# Patient Record
Sex: Female | Born: 1935 | Race: White | Hispanic: No | Smoking: Former smoker
Health system: Southern US, Community
[De-identification: ages and names within clinical notes are randomized; demographics above are authoritative.]

## PROBLEM LIST (undated history)

## (undated) DIAGNOSIS — A0472 Enterocolitis due to Clostridium difficile, not specified as recurrent: Secondary | ICD-10-CM

## (undated) DIAGNOSIS — R911 Solitary pulmonary nodule: Secondary | ICD-10-CM

## (undated) DIAGNOSIS — M47817 Spondylosis without myelopathy or radiculopathy, lumbosacral region: Secondary | ICD-10-CM

## (undated) DIAGNOSIS — N281 Cyst of kidney, acquired: Secondary | ICD-10-CM

## (undated) DIAGNOSIS — R0902 Hypoxemia: Secondary | ICD-10-CM

## (undated) DIAGNOSIS — I495 Sick sinus syndrome: Secondary | ICD-10-CM

## (undated) DIAGNOSIS — J449 Chronic obstructive pulmonary disease, unspecified: Secondary | ICD-10-CM

## (undated) DIAGNOSIS — K5790 Diverticulosis of intestine, part unspecified, without perforation or abscess without bleeding: Secondary | ICD-10-CM

## (undated) DIAGNOSIS — I4891 Unspecified atrial fibrillation: Secondary | ICD-10-CM

## (undated) DIAGNOSIS — J14 Pneumonia due to Hemophilus influenzae: Secondary | ICD-10-CM

## (undated) HISTORY — DX: Enterocolitis due to Clostridium difficile, not specified as recurrent: A04.72

## (undated) HISTORY — DX: Cyst of kidney, acquired: N28.1

## (undated) HISTORY — DX: Sick sinus syndrome: I49.5

## (undated) HISTORY — PX: INSERT / REPLACE / REMOVE PACEMAKER: SUR710

## (undated) HISTORY — DX: Pneumonia due to hemophilus influenzae: J14

## (undated) HISTORY — PX: ABDOMINAL HYSTERECTOMY: SHX81

## (undated) HISTORY — DX: Spondylosis without myelopathy or radiculopathy, lumbosacral region: M47.817

## (undated) HISTORY — DX: Solitary pulmonary nodule: R91.1

## (undated) HISTORY — DX: Diverticulosis of intestine, part unspecified, without perforation or abscess without bleeding: K57.90

## (undated) HISTORY — DX: Chronic obstructive pulmonary disease, unspecified: J44.9

## (undated) HISTORY — DX: Hypoxemia: R09.02

## (undated) HISTORY — DX: Unspecified atrial fibrillation: I48.91

## (undated) HISTORY — PX: TOTAL HIP ARTHROPLASTY: SHX124

---

## 2002-06-16 ENCOUNTER — Inpatient Hospital Stay (HOSPITAL_COMMUNITY): Admission: EM | Admit: 2002-06-16 | Discharge: 2002-06-21 | Payer: Self-pay | Admitting: Cardiology

## 2002-06-17 ENCOUNTER — Encounter: Payer: Self-pay | Admitting: Internal Medicine

## 2002-06-18 ENCOUNTER — Encounter: Payer: Self-pay | Admitting: Cardiology

## 2002-06-21 ENCOUNTER — Encounter: Payer: Self-pay | Admitting: Cardiology

## 2004-09-10 ENCOUNTER — Ambulatory Visit: Payer: Self-pay | Admitting: Cardiology

## 2005-01-31 ENCOUNTER — Ambulatory Visit: Payer: Self-pay | Admitting: *Deleted

## 2005-02-14 ENCOUNTER — Ambulatory Visit: Payer: Self-pay | Admitting: *Deleted

## 2005-12-11 ENCOUNTER — Ambulatory Visit: Payer: Self-pay | Admitting: Cardiology

## 2006-04-08 ENCOUNTER — Ambulatory Visit: Payer: Self-pay | Admitting: Cardiology

## 2006-09-09 ENCOUNTER — Ambulatory Visit: Payer: Self-pay | Admitting: Physician Assistant

## 2006-09-10 ENCOUNTER — Ambulatory Visit: Payer: Self-pay | Admitting: Cardiology

## 2006-10-07 ENCOUNTER — Ambulatory Visit: Payer: Self-pay | Admitting: Cardiology

## 2007-05-18 ENCOUNTER — Ambulatory Visit: Payer: Self-pay | Admitting: Cardiology

## 2007-05-18 ENCOUNTER — Inpatient Hospital Stay (HOSPITAL_COMMUNITY): Admission: RE | Admit: 2007-05-18 | Discharge: 2007-05-23 | Payer: Self-pay | Admitting: Orthopedic Surgery

## 2007-07-12 ENCOUNTER — Ambulatory Visit: Payer: Self-pay | Admitting: Cardiology

## 2008-02-02 ENCOUNTER — Ambulatory Visit: Payer: Self-pay | Admitting: Cardiology

## 2008-02-04 ENCOUNTER — Ambulatory Visit: Payer: Self-pay | Admitting: Cardiology

## 2008-02-04 ENCOUNTER — Ambulatory Visit (HOSPITAL_COMMUNITY): Admission: RE | Admit: 2008-02-04 | Discharge: 2008-02-04 | Payer: Self-pay | Admitting: Cardiology

## 2008-02-04 ENCOUNTER — Encounter: Payer: Self-pay | Admitting: Cardiology

## 2008-03-01 ENCOUNTER — Ambulatory Visit: Payer: Self-pay | Admitting: Internal Medicine

## 2008-07-14 ENCOUNTER — Encounter (INDEPENDENT_AMBULATORY_CARE_PROVIDER_SITE_OTHER): Payer: Self-pay | Admitting: Radiology

## 2008-08-25 ENCOUNTER — Encounter: Payer: Self-pay | Admitting: Physician Assistant

## 2008-08-25 ENCOUNTER — Ambulatory Visit: Payer: Self-pay | Admitting: Cardiology

## 2008-09-04 ENCOUNTER — Encounter: Payer: Self-pay | Admitting: Pulmonary Disease

## 2008-09-08 ENCOUNTER — Telehealth: Payer: Self-pay | Admitting: Cardiology

## 2008-09-11 ENCOUNTER — Encounter: Payer: Self-pay | Admitting: Pulmonary Disease

## 2008-09-13 ENCOUNTER — Telehealth: Payer: Self-pay | Admitting: Physician Assistant

## 2008-09-19 DIAGNOSIS — J4489 Other specified chronic obstructive pulmonary disease: Secondary | ICD-10-CM | POA: Insufficient documentation

## 2008-09-19 DIAGNOSIS — I495 Sick sinus syndrome: Secondary | ICD-10-CM

## 2008-09-19 DIAGNOSIS — I4821 Permanent atrial fibrillation: Secondary | ICD-10-CM

## 2008-09-19 DIAGNOSIS — J449 Chronic obstructive pulmonary disease, unspecified: Secondary | ICD-10-CM

## 2008-09-20 ENCOUNTER — Ambulatory Visit (HOSPITAL_COMMUNITY): Admission: RE | Admit: 2008-09-20 | Discharge: 2008-09-20 | Payer: Self-pay | Admitting: Internal Medicine

## 2008-09-22 ENCOUNTER — Ambulatory Visit: Payer: Self-pay | Admitting: Cardiology

## 2008-09-22 ENCOUNTER — Encounter: Payer: Self-pay | Admitting: Cardiology

## 2008-09-22 DIAGNOSIS — I10 Essential (primary) hypertension: Secondary | ICD-10-CM | POA: Insufficient documentation

## 2008-10-05 ENCOUNTER — Encounter: Payer: Self-pay | Admitting: Pulmonary Disease

## 2008-10-16 ENCOUNTER — Telehealth: Payer: Self-pay | Admitting: Cardiology

## 2008-11-03 ENCOUNTER — Telehealth: Payer: Self-pay | Admitting: Cardiology

## 2008-11-22 ENCOUNTER — Ambulatory Visit: Payer: Self-pay | Admitting: Pulmonary Disease

## 2008-11-22 ENCOUNTER — Encounter (INDEPENDENT_AMBULATORY_CARE_PROVIDER_SITE_OTHER): Payer: Self-pay | Admitting: *Deleted

## 2008-11-22 LAB — CONVERTED CEMR LAB
BUN: 7 mg/dL
CO2: 32 meq/L (ref 19–32)
Calcium: 9.1 mg/dL (ref 8.4–10.5)
Creatinine, Ser: 0.6 mg/dL (ref 0.4–1.2)
Creatinine, Ser: 9.1 mg/dL
GFR calc non Af Amer: 104.82 mL/min
Glucose, Bld: 90 mg/dL (ref 70–99)

## 2008-11-24 ENCOUNTER — Ambulatory Visit: Payer: Self-pay | Admitting: Internal Medicine

## 2008-11-28 ENCOUNTER — Ambulatory Visit: Payer: Self-pay | Admitting: Cardiology

## 2008-12-07 ENCOUNTER — Ambulatory Visit: Payer: Self-pay | Admitting: Pulmonary Disease

## 2008-12-15 ENCOUNTER — Telehealth (INDEPENDENT_AMBULATORY_CARE_PROVIDER_SITE_OTHER): Payer: Self-pay | Admitting: *Deleted

## 2008-12-25 ENCOUNTER — Telehealth: Payer: Self-pay | Admitting: Pulmonary Disease

## 2009-01-03 ENCOUNTER — Telehealth (INDEPENDENT_AMBULATORY_CARE_PROVIDER_SITE_OTHER): Payer: Self-pay | Admitting: *Deleted

## 2009-06-01 ENCOUNTER — Telehealth (INDEPENDENT_AMBULATORY_CARE_PROVIDER_SITE_OTHER): Payer: Self-pay

## 2009-06-07 ENCOUNTER — Telehealth (INDEPENDENT_AMBULATORY_CARE_PROVIDER_SITE_OTHER): Payer: Self-pay | Admitting: *Deleted

## 2009-06-08 ENCOUNTER — Encounter (INDEPENDENT_AMBULATORY_CARE_PROVIDER_SITE_OTHER): Payer: Self-pay | Admitting: *Deleted

## 2009-06-11 ENCOUNTER — Ambulatory Visit: Payer: Self-pay | Admitting: Cardiology

## 2009-06-12 ENCOUNTER — Encounter: Payer: Self-pay | Admitting: Adult Health

## 2009-06-19 ENCOUNTER — Encounter: Payer: Self-pay | Admitting: Cardiology

## 2009-06-19 ENCOUNTER — Ambulatory Visit: Payer: Self-pay | Admitting: Cardiology

## 2009-06-27 ENCOUNTER — Ambulatory Visit: Payer: Self-pay | Admitting: Internal Medicine

## 2009-06-27 DIAGNOSIS — Z950 Presence of cardiac pacemaker: Secondary | ICD-10-CM

## 2009-06-29 ENCOUNTER — Encounter: Payer: Self-pay | Admitting: Internal Medicine

## 2009-06-29 ENCOUNTER — Telehealth: Payer: Self-pay | Admitting: Cardiology

## 2009-09-21 ENCOUNTER — Ambulatory Visit: Payer: Self-pay | Admitting: Cardiology

## 2009-10-24 ENCOUNTER — Telehealth (INDEPENDENT_AMBULATORY_CARE_PROVIDER_SITE_OTHER): Payer: Self-pay

## 2009-10-26 ENCOUNTER — Encounter: Payer: Self-pay | Admitting: Cardiology

## 2009-10-26 ENCOUNTER — Telehealth (INDEPENDENT_AMBULATORY_CARE_PROVIDER_SITE_OTHER): Payer: Self-pay

## 2009-10-29 ENCOUNTER — Telehealth (INDEPENDENT_AMBULATORY_CARE_PROVIDER_SITE_OTHER): Payer: Self-pay | Admitting: *Deleted

## 2009-10-30 ENCOUNTER — Ambulatory Visit: Payer: Self-pay | Admitting: Cardiology

## 2009-11-01 ENCOUNTER — Telehealth (INDEPENDENT_AMBULATORY_CARE_PROVIDER_SITE_OTHER): Payer: Self-pay | Admitting: *Deleted

## 2009-11-13 ENCOUNTER — Ambulatory Visit: Payer: Self-pay | Admitting: Cardiology

## 2009-11-14 ENCOUNTER — Telehealth (INDEPENDENT_AMBULATORY_CARE_PROVIDER_SITE_OTHER): Payer: Self-pay

## 2009-12-05 ENCOUNTER — Ambulatory Visit: Payer: Self-pay | Admitting: Cardiology

## 2009-12-05 ENCOUNTER — Encounter: Payer: Self-pay | Admitting: Internal Medicine

## 2009-12-05 ENCOUNTER — Encounter (INDEPENDENT_AMBULATORY_CARE_PROVIDER_SITE_OTHER): Payer: Self-pay | Admitting: *Deleted

## 2009-12-10 ENCOUNTER — Encounter: Payer: Self-pay | Admitting: Cardiology

## 2009-12-10 LAB — CONVERTED CEMR LAB
Basophils Relative: 0 % (ref 0–1)
Chloride: 103 meq/L (ref 96–112)
Creatinine, Ser: 0.56 mg/dL (ref 0.40–1.20)
INR: 2.37 — ABNORMAL HIGH (ref <1.50)
Lymphs Abs: 2.1 10*3/uL (ref 0.7–4.0)
Monocytes Relative: 9 % (ref 3–12)
Neutro Abs: 3.6 10*3/uL (ref 1.7–7.7)
Neutrophils Relative %: 58 % (ref 43–77)
Potassium: 4.2 meq/L (ref 3.5–5.3)
RBC: 4.37 M/uL (ref 3.87–5.11)
WBC: 6.2 10*3/uL (ref 4.0–10.5)
aPTT: 37 s (ref 24–37)

## 2009-12-12 ENCOUNTER — Encounter: Payer: Self-pay | Admitting: Internal Medicine

## 2009-12-13 ENCOUNTER — Ambulatory Visit (HOSPITAL_COMMUNITY): Admission: RE | Admit: 2009-12-13 | Discharge: 2009-12-13 | Payer: Self-pay | Admitting: Internal Medicine

## 2009-12-13 ENCOUNTER — Ambulatory Visit: Payer: Self-pay | Admitting: Internal Medicine

## 2009-12-26 ENCOUNTER — Ambulatory Visit: Payer: Self-pay | Admitting: Cardiology

## 2010-01-01 ENCOUNTER — Encounter: Payer: Self-pay | Admitting: Internal Medicine

## 2010-04-11 ENCOUNTER — Encounter: Payer: Self-pay | Admitting: Cardiology

## 2010-04-23 NOTE — Letter (Signed)
Summary: Implantable Device Instructions  [facility] [place]  [address] [place] [address]   Phone: [phone]  Fax: [phone]      Implantable Device Instructions  You are scheduled for:  _____ Permanent Transvenous Pacemaker _____ Implantable Cardioverter Defibrillator _____ Implantable Loop Recorder __x___ Generator Change  on 12/13/09 with Dr. Johney Frame.  1.  Please arrive at the Short Stay Center at [facility] at 1:30pm on the day of your procedure.  2.  Do not eat or drink the night before your procedure.  3.  Complete lab work on 12/10/09.    4.  Do NOT take these medications the morning of  your procedure:  furosemide.    5.  Plan for an overnight stay.  Bring your insurance cards and a list of your medications.  6.  Wash your chest and neck with antibacterial soap (any brand) the evening before and the morning of your procedure.  Rinse well.  7.  Education material received:     Pacemaker _____           ICD _____           Arrhythmia _____  *If you have ANY questions after you get home, please call the office [phone].  *Every attempt is made to prevent procedures from being rescheduled.  Due to the nauture of Electrophysiology, rescheduling can happen.  The physician is always aware and directs the staff when this occurs.

## 2010-04-23 NOTE — Assessment & Plan Note (Signed)
Summary: 11:30 appt rov/bp   Visit Type:  Follow-up Referring Provider:  Nona Dell Primary Provider:  Doreen Beam, MD  CC:  issues with bp's and heart rate.  History of Present Illness: [name] is a pleasant 75 CF patient of Dr. Diona Browner with known history of tachybrady syndrome with PAF.  She has a pacemaker that was placed in March of 2004, Medtronic per Dr. [name].  She has been experiencing episodes of rapid HR for 2 weeks.  They began in the evening while she was getting ready for bed, lasting several minutes with associated chest tightness and resolve on their own.  This occured every night for about 4 days. She finally went to [facility] ER for evaluation.    Review of records from [facility] revealed that she was given a bolus of cardiazem 10mg  IV and also 5 mg of lopressor IV, started on a cardiazem gtt for or 12 hours.  She was released the following day on her current medication regimine and to follow up with Dr. Diona Browner  Unfortunately she began to have  tachy palpatations now occuring during the day as well as at night.  She requested an earlier appointment to be evaluated.  We have requested Medtronic pacemaker to be interrogated during this visit.  She states that she can tell when her HR is up because of chest tightness and fatigue.  She denies other symptoms associated with the chest tightness and tachypalpatations.  Medtronic rep, Cromwell, did come and interrogate her PMK.  The histogram showed several episodes of HR between 111-183bpm lasting from 4 minutes to 8 minutes.  The pacemaker was found to be functioning appropriately.  Current Medications (verified): 1)  Spiriva Handihaler 18 Mcg Caps (Tiotropium Bromide Monohydrate) .... Once Daily 2)  Advair Diskus 100-50 Mcg/dose Misc (Fluticasone-Salmeterol) .... 2 Puffs Two Times A Day 3)  Ventolin Hfa 108 (90 Base) Mcg/act Aers (Albuterol Sulfate) .... Two Puffs Up To Four Times Per Day As Needed 4)   Albuterol Sulfate 0.63 Mg/71ml Nebu (Albuterol Sulfate) .... As Needed 5)  Coumadin 5 Mg Tabs (Warfarin Sodium) .... As Directed 6)  Atenolol 50 Mg Tabs (Atenolol) .... Take 1 Tablet By Mouth Two Times A Day 7)  Verapamil Hcl Cr 240 Mg Xr24h-Cap (Verapamil Hcl) .... Take One Capsule By Mouth in The Morning and One Half Tablet At Bedtime 8)  Evista 60 Mg Tabs (Raloxifene Hcl) .Marland Kitchen.. 1 Tab Daily 9)  Xanax 0.25 Mg Tabs (Alprazolam) .... Once Daily 10)  Digoxin 0.125 Mg Tabs (Digoxin) .... Take 1 Tablet By Mouth Once Daily  Allergies (verified): 1)  ! Sulfa PMH-FH-SH reviewed-no changes except otherwise noted  Review of Systems       tachycardia and chest tightness  Vital Signs:  Patient profile:   75 year old female Weight:      128 pounds Pulse rate:   75 / minute BP sitting:   169 / 93  (right arm)  Vitals Entered By: Dreama Saa, CNA (June 11, 2009 11:35 AM)  Physical Exam  General:  Well developed, well nourished, in no acute distress. Lungs:  Some scattered crackles, mild with prolonged expiratory effort. Abdomen:  Bowel sounds positive; abdomen soft and non-tender without masses, organomegaly, or hernias noted. No hepatosplenomegaly. Msk:  Back normal, normal gait. Muscle strength and tone normal. Extremities:  No clubbing or cyanosis. Psych:  Normal affect.   EKG  Procedure date:  06/11/2009  Findings:      Ventricle is paced.  PPM Specifications Following MD:  Lewayne Bunting, MD     PPM Vendor:  Medtronic     PPM Model Number:  [id number]     PPM Serial Number:  [id number] PPM DOI:  06/20/2002     PPM Implanting MD:  Lewayne Bunting, MD  Lead 1    Location: RA     DOI: 06/20/2002     Model #: [id number]     Serial #: [id number]     Status: active Lead 2    Location: RV     DOI: 06/20/2002     Model #: [id number]     Serial #: [id number]     Status: active   Indications:  T/B SYNDROME WITH PAF   Impression & Recommendations:  Problem # 1:  PAROXYSMAL ATRIAL FIBRILLATION  (ICD-427.31) After reviewing the pacemaker interrogation report, I called Dr. [name] for advisement.  I gave him a list of her medications let him know I planned to increase her atenolol to 50mg  two times a day (from 25mg  two times a day).  He agreed that this was appropriate and also ordered that she begin on digoxin 0.125mg  daily.  [name] was called and the Rx was sent to [facility].  She will follow-up with Dr. [name] on his next available appointment, and of course, continue with her primary cardiologist, Dr. Diona Browner. Her updated medication list for this problem includes:    Coumadin 5 Mg Tabs (Warfarin sodium) .Marland Kitchen... As directed    Atenolol 25 Mg Tabs (Atenolol) .Marland Kitchen... Take 1 tablet by mouth two times a day  Patient Instructions: 1)  Your physician recommends that you schedule a follow-up appointment on: April 6 @ 4:15 with Dr. [name] 2)  Your physician has recommended you make the following change in your medication: Increase Atenolol to 50mg  by mouth two times a day and Digoxin 0.125mg  by mouth once daily  Prescriptions: DIGOXIN 0.125 MG TABS (DIGOXIN) take 1 tablet by mouth once daily  #30 x 0   Entered by:   Larita Fife Via LPN   Authorized by:   Joni Reining, NP   Signed by:   Larita Fife Via LPN on 25/95/6387   Method used:   Electronically to        [facility]* (retail)       [address]       [place], [place]  [address]       Ph: [phone]       Fax: [phone]   RxID:   [id number] DIGOXIN 0.125 MG TABS (DIGOXIN) take 1 tablet by mouth once daily  #90 x 3   Entered by:   Larita Fife Via LPN   Authorized by:   Joni Reining, NP   Signed by:   Larita Fife Via LPN on 62/37/6283   Method used:   Electronically to        [facility]* (retail)       [address] [place], [place]  [address]       Ph: [phone]       Fax: [phone]   RxID:   [id number] ATENOLOL 50 MG TABS (ATENOLOL) take 1 tablet by mouth two times a day  #180 x 3   Entered by:   Larita Fife  Via LPN   Authorized by:   Joni Reining, NP   Signed by:   Larita Fife Via LPN on 96/78/9381   Method used:   Electronically to  [facility]* (retail)       [address] [place], [place]  [address]       Ph: [phone]       Fax: [phone]   RxID:   [id number]

## 2010-04-23 NOTE — Progress Notes (Signed)
Summary: RX QUESTIONS  Phone Note Call from Patient Call back at Home Phone [phone]   Caller: PT Reason for Call: Refill Medication Summary of Call: PT GOT RX LETTER FROM [facility] THAT THEY DO NOT CARRY THE RX FOR VERAPIMIL 240MG  TWICE A DAY [phone]. SHE HAS ALWAY GOT ITY FROM THERE BUT TAKING ONE AND A HALF A DAY. PLEASE CALL RX COMPANY TO VERIFY THIS IS CORRECT.  Initial call taken by: Faythe Ghee,  June 29, 2009 11:30 AM  Follow-up for Phone Call        I called and verified rx with [facility] Follow-up by: Teressa Lower RN,  June 29, 2009 1:14 PM

## 2010-04-23 NOTE — Letter (Signed)
Summary: 09-20-09 DISCHARGE SUMMARY FROM [facility]  09-20-09 DISCHARGE SUMMARY FROM [facility]   Imported By: Faythe Ghee 10/26/2009 16:36:57  _____________________________________________________________________  External Attachment:    Type:   Image     Comment:   External Document

## 2010-04-23 NOTE — Assessment & Plan Note (Signed)
Summary: per pt request due to recent hospitalization/tg   Visit Type:  Follow-up Primary Provider:  Dr. Doreen Beam   History of Present Illness: 75 year old woman presents for followup. She has had difficulty with control of rapid atrial arrhythmias, requiring hospital admission at [facility] over the summer, and also subsequent medication adjustments with home health assistance. She did have a followup echocardiogram done back in July, reviewed below.  She has settled into the regimen detailed below. She is tolerating verapamil much better than diltiazem. She brings a record of her blood pressure and heart rates at home, and generally the trend is better over the last few weeks.  Breathing status is stable, on supplement oxygen. A recent wheezing. She is tolerating atenolol.  No bleeding problems on Coumadin.  Current Medications (verified): 1)  Spiriva Handihaler 18 Mcg Caps (Tiotropium Bromide Monohydrate) .... Once Daily 2)  Advair Hfa 115-21 Mcg/act Aero (Fluticasone-Salmeterol) .... 2 Puffs Two Times A Day 3)  Ventolin Hfa 108 (90 Base) Mcg/act Aers (Albuterol Sulfate) .... Two Puffs Up To Four Times Per Day As Needed 4)  Albuterol Sulfate 0.63 Mg/41ml Nebu (Albuterol Sulfate) .... 2 Puffs As Needed 5)  Coumadin 5 Mg Tabs (Warfarin Sodium) .... As Directed 6)  Atenolol 50 Mg Tabs (Atenolol) .... Take 1 Tablet By Mouth Daily 7)  Verapamil Hcl Cr 240 Mg Cr-Tabs (Verapamil Hcl) .... Take 1 Tablet By Mouth Two Times A Day 8)  Evista 60 Mg Tabs (Raloxifene Hcl) .Marland Kitchen.. 1 Tab Daily(Pt Out At This Time) 9)  Digoxin 0.125 Mg Tabs (Digoxin) .... Take 1 Tablet By Mouth Once Daily 10)  Furosemide 40 Mg Tabs (Furosemide) .... Take 1 Tablet By Mouth Once A Day As Needed 11)  Alprazolam 0.25 Mg Tabs (Alprazolam) .... Take 1 Tablet By Mouth Two Times A Day As Needed  Allergies (verified): 1)  ! Sulfa  Past History:  Past Medical History: Last updated:  12/07/2008 Osteoporosis Diverticulosis Renal Cyst Hyperthyroidism C. difficile colitis December 2006 Lumba-sacral DJD COPD      - PFT 10/05/08 FEV1 0.89(43%), FVC 1.84(62%), FEV1% 49, TLC 5.64(120%), DLCO 38% Hypoxemia      - Uses nocturnal supplemental oxygen Paroxysmal atrial fibrillation Sick Sinus Syndrome  Social History: Last updated: 11/22/2008 Retired.  Worked at [employer].  Quit drinking in 1994.  Smoked 1 ppd for 20 yrs, and quit in 2005.  Review of Systems       The patient complains of dyspnea on exertion.  The patient denies anorexia, fever, chest pain, syncope, peripheral edema, melena, and hematochezia.         Otherwise reviewed and negative.  Vital Signs:  Patient profile:   75 year old female Weight:      120 pounds Pulse rate:   106 / minute BP sitting:   110 / 70  (right arm)  Vitals Entered By: Dreama Saa, CNA (November 13, 2009 2:38 PM)  Physical Exam  Additional Exam:  Chronically ill-appearing woman in no acute distress, wearing oxygen. HEENT: Conjunctiva and lids normal, oropharynx with poor dentition. Neck: Supple, no elevated jugular venous pressure, no thyromegaly. Cardiac: Irregularly irregular, distant, no S3 gallop. Lungs: Clear with diminished breath sounds throughout, nonlabored, no wheezing. Abdomen: Soft, no hepatomegaly. Extremities: Mild venous stasis, distal pulses one plus. Skin: Warm and dry. Musculoskeletal: No kyphosis. Neuropsychiatric: Alert and oriented x3, affect appropriate.   Echocardiogram  Procedure date:  09/21/2009  Findings:      Normal left ventricular chamber size with mild LVH, LVEF  greater than 65%, diastolic dysfunction, mitral annular calcification with trace mitral regurgitation, aortic annular calcification, device wire noted in right ventricle, mild tricuspid regurgitation, RVSP 42 mm mercury, trivial pericardial effusion.  PPM Specifications Following MD:  Lewayne Bunting, MD     PPM Vendor:   Medtronic     PPM Model Number:  [id number]     PPM Serial Number:  [id number] PPM DOI:  06/20/2002     PPM Implanting MD:  Lewayne Bunting, MD  Lead 1    Location: RA     DOI: 06/20/2002     Model #: [id number]     Serial #: [id number]     Status: active Lead 2    Location: RV     DOI: 06/20/2002     Model #: [id number]     Serial #: [id number]     Status: active  Magnet Response Rate:  BOL 85 ERI 65  Indications:  T/B SYNDROME WITH PAF   PPM Follow Up Pacer Dependent:  Yes      Episodes Coumadin:  Yes  Parameters Mode:  DDDR     Lower Rate Limit:  60     Upper Rate Limit:  130 Paced AV Delay:  250     Sensed AV Delay:  250  Impression & Recommendations:  Problem # 1:  PAROXYSMAL ATRIAL FIBRILLATION (ICD-427.31)  Persistent at this point, with plan for anticoagulation and rate control. Continue verapamil at current dose, digoxin, and increase atenolol to 50 mg daily. She continues on Coumadin. I'll see her back in the next 3 weeks.  Her updated medication list for this problem includes:    Coumadin 5 Mg Tabs (Warfarin sodium) .Marland Kitchen... As directed    Atenolol 50 Mg Tabs (Atenolol) .Marland Kitchen... Take 1 tablet by mouth daily    Digoxin 0.125 Mg Tabs (Digoxin) .Marland Kitchen... Take 1 tablet by mouth once daily  Problem # 2:  CARDIAC PACEMAKER IN SITU (ICD-V45.01)  History of tachycardia-bradycardia syndrome, status post pacemaker placement, followed by Dr. [name]. I asked nursing to verify device clinic followup.  Patient Instructions: 1)  Your physician recommends that you schedule a follow-up appointment in: 3 weeks 2)  Your physician has recommended you make the following change in your medication: Increase Atenolol to 1whole tablet (50mg ) once daily  Prescriptions: ATENOLOL 50 MG TABS (ATENOLOL) take 1 tablet by mouth daily  #15 x 0   Entered by:   Larita Fife Via LPN   Authorized by:   Loreli Slot, MD, [facility]   Signed by:   Larita Fife Via LPN on 37/12/6267   Method used:   Electronically to        [facility]*  (retail)       [address]       [place], [place]  [address]       Ph: [phone]       Fax: [phone]   RxID:   [id number] ATENOLOL 50 MG TABS (ATENOLOL) take 1 tablet by mouth daily  #90 x 0   Entered by:   Larita Fife Via LPN   Authorized by:   Loreli Slot, MD, [facility]   Signed by:   Larita Fife Via LPN on 52/77/8242   Method used:   Electronically to        [facility]* (retail)       [address]       [place], [place]  [address]  Ph: [phone]       Fax: [phone]   RxID:   [id number]

## 2010-04-23 NOTE — Assessment & Plan Note (Signed)
Summary: wound check  Nurse Visit   Visit Type:  Wound check Referring Provider:  Dr. Lewayne Bunting Primary Provider:  Dr. Doreen Beam   History of Present Illness: Pt. has no complaints at this time. Steri strips removed. Incision is without redness,swelling or drainage. Wound cleaned with normal saline and left open to air. Dr. [name] odserved site, given ad-lib activity orders.   Allergies: 1)  ! Sulfa

## 2010-04-23 NOTE — Procedures (Signed)
Summary: Cardiology Device Clinic   Allergies: 1)  ! Sulfa  PPM Specifications Following MD:  Lewayne Bunting, MD     PPM Vendor:  Medtronic     PPM Model Number:  [id number]     PPM Serial Number:  [id number] PPM DOI:  06/20/2002     PPM Implanting MD:  Lewayne Bunting, MD  Lead 1    Location: RA     DOI: 06/20/2002     Model #: [id number]     Serial #: [id number]     Status: active Lead 2    Location: RV     DOI: 06/20/2002     Model #: [id number]     Serial #: [id number]     Status: active  Magnet Response Rate:  BOL 85 ERI 65  Indications:  T/B SYNDROME WITH PAF   PPM Follow Up Remote Check?  No Battery Voltage:  2.59 V     Battery Est. Longevity:  ERI     Pacer Dependent:  No      Episodes Coumadin:  Yes  Parameters Mode:  VVI     Lower Rate Limit:  65     Upper Rate Limit:  130 Paced AV Delay:  250     Sensed AV Delay:  250 Tech Comments:  Interrogation only the device reached ERI 08/13/09 despite that it was checked in office 4/11with an estimated longevity of 6 months.  It has reverted to the VVI mode and she is not dependent.  She is scheduled for a change out next week with Dr. Johney Frame. Altha Harm, LPN  December 05, 2009 10:48 AM

## 2010-04-23 NOTE — Assessment & Plan Note (Signed)
Summary: PACER CHECK PER CHECKOUT ON 06/11/09/TG   Visit Type:  Follow-up Referring Provider:  Nona Dell Primary Provider:  Dr. Doreen Beam   History of Present Illness: Ms. Keeven returns today for followup.  She is a very pleasant woman with a history of symptomatic bradycardia and atrial fibrillation who is status post pacemaker insertion.  This was performed back in March 2004 by Dr. Graciela Husbands.  She returns today for her EP visit in [place].  She has preferred to move to our [place] office.  She has seen Dr. Diona Browner here and states that she likes coming to [place].  She denies chest pain.  She has chronic dyspnea.  She has chronic COPD and asthma. She denies syncope.  Current Medications (verified): 1)  Spiriva Handihaler 18 Mcg Caps (Tiotropium Bromide Monohydrate) .... Once Daily 2)  Advair Diskus 100-50 Mcg/dose Misc (Fluticasone-Salmeterol) .... 2 Puffs Two Times A Day Usually Only Does Hs 3)  Ventolin Hfa 108 (90 Base) Mcg/act Aers (Albuterol Sulfate) .... Two Puffs Up To Four Times Per Day As Needed 4)  Albuterol Sulfate 0.63 Mg/75ml Nebu (Albuterol Sulfate) .... As Needed 5)  Coumadin 5 Mg Tabs (Warfarin Sodium) .... As Directed 6)  Atenolol 50 Mg Tabs (Atenolol) .... Take 2  Tablet By Mouth Two Times A Day 7)  Verapamil Hcl Cr 240 Mg Xr24h-Cap (Verapamil Hcl) .... Take One Tablet By Mouth Two Times A Day 8)  Evista 60 Mg Tabs (Raloxifene Hcl) .Marland Kitchen.. 1 Tab Daily 9)  Xanax 0.25 Mg Tabs (Alprazolam) .... Once Daily 10)  Digoxin 0.125 Mg Tabs (Digoxin) .... Take 1 Tablet By Mouth Once Daily  Allergies (verified): 1)  ! Sulfa  Past History:  Past Medical History: Last updated: 12/07/2008 Osteoporosis Diverticulosis Renal Cyst Hyperthyroidism C. difficile colitis December 2006 Lumba-sacral DJD COPD      - PFT 10/05/08 FEV1 0.89(43%), FVC 1.84(62%), FEV1% 49, TLC 5.64(120%), DLCO 38% Hypoxemia      - Uses nocturnal supplemental oxygen Paroxysmal atrial  fibrillation Sick Sinus Syndrome  Past Surgical History: Last updated: 11/28/2008 Right total hip replacement  Review of Systems       The patient complains of dyspnea on exertion.  The patient denies chest pain, syncope, and peripheral edema.    Vital Signs:  Patient profile:   75 year old female Weight:      131 pounds Pulse rate:   64 / minute BP sitting:   106 / 60  (right arm)  Vitals Entered By: Dreama Saa, CNA (June 27, 2009 4:32 PM)  Physical Exam  General:  Well developed, well nourished, in no acute distress. Eyes:  PERRLA and EOMI.   Mouth:  no deformity or lesions Neck:  no JVD.   Chest Wall:  barrel chest.  Well healed PPM incision. Lungs:  Some scattered crackles, mild with prolonged expiratory effort. Heart:  regular rate and rhythm, S1, S2 without murmurs, rubs, gallops, or clicks Abdomen:  Bowel sounds positive; abdomen soft and non-tender without masses, organomegaly, or hernias noted. No hepatosplenomegaly. Msk:  Back normal, normal gait. Muscle strength and tone normal. Pulses:  pulses normal Extremities:  No clubbing or cyanosis. Neurologic:  CN II-XII grossly intact with normal reflexes, coordination, muscle strength and tone   PPM Specifications Following MD:  Lewayne Bunting, MD     PPM Vendor:  Medtronic     PPM Model Number:  [id number]     PPM Serial Number:  [id number] PPM DOI:  06/20/2002     PPM  Implanting MD:  Lewayne Bunting, MD  Lead 1    Location: RA     DOI: 06/20/2002     Model #: 1610     Serial #: [id number]     Status: active Lead 2    Location: RV     DOI: 06/20/2002     Model #: 1191     Serial #: [id number]     Status: active  Magnet Response Rate:  BOL 85 ERI 65  Indications:  T/B SYNDROME WITH PAF   PPM Follow Up Remote Check?  No Battery Voltage:  2.62 V     Battery Est. Longevity:  6 months     Pacer Dependent:  Yes       PPM Device Measurements Atrium  Amplitude: 4.0 mV, Impedance: 611 ohms, Threshold: 0.75 V at 0.4  msec Right Ventricle  Amplitude: 11.20 mV, Impedance: 741 ohms, Threshold: 0.5 V at 0.4 msec  Episodes MS Episodes:  146     Percent Mode Switch:  12.4%     Coumadin:  Yes Ventricular High Rate:  1     Atrial Pacing:  65.1%     Ventricular Pacing:  2.4%  Parameters Mode:  DDDR     Lower Rate Limit:  60     Upper Rate Limit:  130 Paced AV Delay:  250     Sensed AV Delay:  250 Next Cardiology Appt Due:  09/21/2009 Tech Comments:  RV2.5@0 .4.  RVR during A-fib episodes, + coumadin.  Battery near ERI.  ROV 3 months RDS clinic. Altha Harm, LPN  June 27, 3084 4:48 PM  MD Comments:  Agree with above.  Impression & Recommendations:  Problem # 1:  CARDIAC PACEMAKER IN SITU (ICD-V45.01) Her device is working normally.  She is approaching ERI.  We will recheck the device in 3 months.  Problem # 2:  ESSENTIAL HYPERTENSION, BENIGN (ICD-401.1) Her blood pressure is normal today. She will continue with her current meds. Her updated medication list for this problem includes:    Atenolol 50 Mg Tabs (Atenolol) .Marland Kitchen... Take 2  tablet by mouth two times a day    Verapamil Hcl Cr 240 Mg Xr24h-cap (Verapamil hcl) .Marland Kitchen... Take one tablet by mouth two times a day  Problem # 3:  PAROXYSMAL ATRIAL FIBRILLATION (ICD-427.31) She has mostly maintained NSR.  She has minimal palpitations.  She will continue a strategy of rate control. Her updated medication list for this problem includes:    Coumadin 5 Mg Tabs (Warfarin sodium) .Marland Kitchen... As directed    Atenolol 50 Mg Tabs (Atenolol) .Marland Kitchen... Take 2  tablet by mouth two times a day    Digoxin 0.125 Mg Tabs (Digoxin) .Marland Kitchen... Take 1 tablet by mouth once daily

## 2010-04-23 NOTE — Progress Notes (Signed)
Summary: BP  Phone Note Call from Patient Call back at Home Phone [phone]   Reason for Call: Talk to Nurse Summary of Call: PT STILL HAVING BP ISSUES NEEDS TO KNOW WHAT ELSE TO DO. Initial call taken by: Faythe Ghee,  June 07, 2009 4:18 PM  Follow-up for Phone Call        pt will see NP 06/08/2009 2:30pm Follow-up by: Teressa Lower RN,  June 08, 2009 10:34 AM

## 2010-04-23 NOTE — Progress Notes (Signed)
Summary: Return Phone Call  Phone Note Call from Patient   Caller: Patient Reason for Call: Talk to Nurse Summary of Call: pt states that heart rate has been elevating at night for past 3 nights to 150-160's/wants to know what she needs to do regarding this/tg Initial call taken by: Raechel Ache [facility],  June 01, 2009 11:22 AM  Follow-up for Phone Call        Patient states her HR elevates at bedtime but not during the day, she c/o chest tightness when HR elevates. Pt. advised to go to ER if symptoms persist or worsen. she states she understands info. given. Also, pt. given appt. to see Dr. Diona Browner on March 29. Follow-up by: Larita Fife Via LPN,  June 01, 2009 4:56 PM  Additional Follow-up for Phone Call Additional follow up Details #1::        Let's get her pacer interrogated and see if she is having high atrial rates at night.  If so, can consider further advancing her medications. Additional Follow-up by: Loreli Slot, MD, [facility],  June 09, 2009 7:58 PM    Additional Follow-up for Phone Call Additional follow up Details #2::    Pt. being seen this morning by Joni Reining, NP. Chuck from [employer] is due to check her Pacemaker at this visit also. Will send results to Dr. Diona Browner.   Additional Follow-up for Phone Call Additional follow up Details #3:: Details for Additional Follow-up Action Taken: Thank you. Additional Follow-up by: Loreli Slot, MD, [facility],  June 11, 2009 1:16 PM  Office visit routed to Dr. Diona Browner for review.

## 2010-04-23 NOTE — Assessment & Plan Note (Signed)
Summary: ROV   Visit Type:  Follow-up Primary Provider:  Dr. Doreen Beam   History of Present Illness: 75 year old woman presents for a followup visit. She was just recently seen by Ms. Lawrence approximally one week ago with palpitations and documentation of episodic, rapid atrial fibrillation based on pacemaker interrogation, necessitating medication adjustments. Atenolol was increased to 100 mg p.o. b.i.d., and digoxin 0.125 mg daily was added. Despite this intervention, she continues to experience intermittent palpitations on a regular basis. It sounds as if her peak heart rate is less than it was, although today in clinic her heart rate is about 120 beats per minute in atrial fibrillation. She continues on Coumadin.  Current Medications (verified): 1)  Spiriva Handihaler 18 Mcg Caps (Tiotropium Bromide Monohydrate) .... Once Daily 2)  Advair Diskus 100-50 Mcg/dose Misc (Fluticasone-Salmeterol) .... 2 Puffs Two Times A Day Usually Only Does Hs 3)  Ventolin Hfa 108 (90 Base) Mcg/act Aers (Albuterol Sulfate) .... Two Puffs Up To Four Times Per Day As Needed 4)  Albuterol Sulfate 0.63 Mg/67ml Nebu (Albuterol Sulfate) .... As Needed 5)  Coumadin 5 Mg Tabs (Warfarin Sodium) .... As Directed 6)  Atenolol 50 Mg Tabs (Atenolol) .... Take 2  Tablet By Mouth Two Times A Day 7)  Verapamil Hcl Cr 240 Mg Xr24h-Cap (Verapamil Hcl) .... Take One Tablet By Mouth Two Times A Day 8)  Evista 60 Mg Tabs (Raloxifene Hcl) .Marland Kitchen.. 1 Tab Daily 9)  Xanax 0.25 Mg Tabs (Alprazolam) .... Once Daily 10)  Digoxin 0.125 Mg Tabs (Digoxin) .... Take 1 Tablet By Mouth Once Daily  Allergies (verified): 1)  ! Sulfa  Past History:  Past Medical History: Last updated: 12/07/2008 Osteoporosis Diverticulosis Renal Cyst Hyperthyroidism C. difficile colitis December 2006 Lumba-sacral DJD COPD      - PFT 10/05/08 FEV1 0.89(43%), FVC 1.84(62%), FEV1% 49, TLC 5.64(120%), DLCO 38% Hypoxemia      - Uses nocturnal supplemental  oxygen Paroxysmal atrial fibrillation Sick Sinus Syndrome  Social History: Last updated: 11/22/2008 Retired.  Worked at [employer].  Quit drinking in 1994.  Smoked 1 ppd for 20 yrs, and quit in 2005.   Review of Systems  The patient denies anorexia, fever, weight loss, chest pain, syncope, hemoptysis, melena, and hematochezia.         Otherwise reviewed and negative.  Vital Signs:  Patient profile:   75 year old female Height:      65 inches Weight:      129 pounds O2 Sat:      96 % on Room air Pulse rate:   116 / minute BP sitting:   130 / 90  (left arm)  Vitals Entered By: Teressa Lower RN (June 19, 2009 3:54 PM)  O2 Flow:  Room air  Physical Exam  Additional Exam:  Chronically ill-appearing woman in no acute distress. HEENT: Conjunctiva and lids normal, oropharynx with poor dentition. Neck: Supple, no elevated jugular venous pressure, no thyromegaly. Cardiac: Irregularly irregular, distant, no S3 gallop. Lungs: Clear with diminished breath sounds throughout, nonlabored, no wheezing. Abdomen: Soft, no hepatomegaly. Extremities: 1+ pitting edema right greater than left in the lower extremities, mild venous stasis, distal pulses one plus.   EKG  Procedure date:  06/19/2009  Findings:      Atrial fibrillation at 118 beats per minute with nonspecific ST-T wave changes.  PPM Specifications Following MD:  Lewayne Bunting, MD     PPM Vendor:  [employer]     PPM Model Number:  [id number]  PPM Serial Number:  [id number] PPM DOI:  06/20/2002     PPM Implanting MD:  Lewayne Bunting, MD  Lead 1    Location: [facility]     DOI: 06/20/2002     Model #: [id number]     Serial #: [id number]     Status: active Lead 2    Location: RV     DOI: 06/20/2002     Model #: [id number]     Serial #: [id number]     Status: active   Indications:  T/B SYNDROME WITH PAF   Impression & Recommendations:  Problem # 1:  PAROXYSMAL ATRIAL FIBRILLATION (ICD-427.31)  This continues to be a problem associated with  rapid ventricular response despite medication adjustments to date. She seems to be tolerating high-dose beta blocker therapy in the setting of COPD. I am somewhat reluctant to continue to push this however. We will increase her Verapamil SR to 240 mg p.o. b.i.d., and continue her current dose of atenolol and digoxin. She is on Coumadin as well for stroke prophylaxis. She already has a followup visit scheduled with Dr. [name] for April 6, and she was encouraged to keep this visit. If heart rate control is inadequate despite high dose multimodal therapy, consideration will need to be given for an antiarrhythmic, perhaps even an AV node ablation since pacemaker is already in place.  Her updated medication list for this problem includes:    Coumadin 5 Mg Tabs (Warfarin sodium) .Marland Kitchen... As directed    Atenolol 50 Mg Tabs (Atenolol) .Marland Kitchen... Take 2  tablet by mouth two times a day    Digoxin 0.125 Mg Tabs (Digoxin) .Marland Kitchen... Take 1 tablet by mouth once daily  Patient Instructions: 1)  Your physician recommends that you schedule a follow-up appointment in: 2 months (keep appt. with Dr. [name] on April 6 also) 2)  Your physician has recommended you make the following change in your medication: Start taking Verapamil 240mg  by mouth two times a day  Prescriptions: VERAPAMIL HCL CR 240 MG XR24H-CAP (VERAPAMIL HCL) Take one tablet by mouth two times a day  #180 x 1   Entered by:   Larita Fife Via LPN   Authorized by:   Loreli Slot, MD, [facility]   Signed by:   Larita Fife Via LPN on 59/56/3875   Method used:   Electronically to        [facility]* (retail)       [address] [place], [place]  [address]       Ph: [phone]       Fax: [phone]   RxID:   [id number]

## 2010-04-23 NOTE — Letter (Signed)
Summary: 10-16-09 LABS FROM [facility]  10-16-09 LABS FROM [facility]   Imported By: Faythe Ghee 10/26/2009 16:39:43  _____________________________________________________________________  External Attachment:    Type:   Image     Comment:   External Document

## 2010-04-23 NOTE — Letter (Signed)
Summary: 10-01-09 DISCHARGE FROM [facility]  10-01-09 DISCHARGE FROM [facility]   Imported By: Faythe Ghee 10/26/2009 16:38:20  _____________________________________________________________________  External Attachment:    Type:   Image     Comment:   External Document

## 2010-04-23 NOTE — Progress Notes (Signed)
Summary: medication changes  Phone Note Outgoing Call   Call placed by: Teressa Lower RN,  November 01, 2009 9:25 AM Call placed to: Patient Summary of Call: I discussed change in medication: stop diliazem and restart verapamil 240mg  two times a day , continue atenolol and digoxin appt on 11/13/09 Initial call taken by: Teressa Lower RN,  November 01, 2009 9:39 AM    New/Updated Medications: VERAPAMIL HCL CR 240 MG CR-TABS (VERAPAMIL HCL) Take 1 tablet by mouth once a day VERAPAMIL HCL CR 240 MG CR-TABS (VERAPAMIL HCL) Take 1 tablet by mouth two times a day Prescriptions: VERAPAMIL HCL CR 240 MG CR-TABS (VERAPAMIL HCL) Take 1 tablet by mouth two times a day  #60 x 3   Entered by:   Teressa Lower RN   Authorized by:   Loreli Slot, MD, [facility]   Signed by:   Teressa Lower RN on 11/01/2009   Method used:   Electronically to        [facility]* (retail)       [address]       [place], [place]  [address]       Ph: [phone]       Fax: [phone]   RxID:   [id number] VERAPAMIL HCL CR 240 MG CR-TABS (VERAPAMIL HCL) Take 1 tablet by mouth once a day  #30 x 6   Entered by:   Teressa Lower RN   Authorized by:   Loreli Slot, MD, [facility]   Signed by:   Teressa Lower RN on 11/01/2009   Method used:   Electronically to        [facility]* (retail)       [address]       [place], [place]  [address]       Ph: [phone]       Fax: [phone]   RxID:   [id number]  twice daily verapamil order is the correct dose   Teressa Lower RN  November 01, 2009 9:38 AM

## 2010-04-23 NOTE — Progress Notes (Signed)
**Note De-Identified [name] Obfuscation** Summary: MED QUESTIONS  Phone Note Call from Patient Call back at Home Phone [phone]   Caller: PT Reason for Call: Talk to Nurse Summary of Call: PT WAS SEEN 11/13/09 AND HAD A MEDICATION CHARNGE, SHE IS UNSURE OF THE DOSE. PLEASE CALL PT ASAP Initial call taken by: Faythe Ghee,  November 14, 2009 1:57 PM  Follow-up for Phone Call        Pt. advised that she is to take 50mg  of Atenolol once daily, she expressed verbal understanding. Follow-up by: Larita Fife Sumiya Mamaril LPN,  November 14, 2009 3:04 PM

## 2010-04-23 NOTE — Progress Notes (Signed)
Summary: Elevated HR  Phone Note From Other Clinic   Caller: Selena Batten [facility] home health nurse) Request: Talk with Nurse Details for Reason: elevated HR Summary of Call: Selena Batten , home healthcare nurse for [place], states that pt's HR has been elevated.  She also states that Mrs. Markel was recently released from [facility]. on or around the 18th of July for A-fib.  Selena Batten is advised to consult with PMD and/or cardiologist that treated pt. while in [facility]. as we do not have records from that stay.  Waiting for pt's signature to fax [facility] hosp. for records. Initial call taken by: Larita Fife Via LPN,  October 24, 2009 1:02 PM  Follow-up for Phone Call         Follow-up by: Loreli Slot, MD, [facility],  October 24, 2009 1:36 PM

## 2010-04-23 NOTE — Progress Notes (Signed)
**Note De-Identified Areal Cochrane Obfuscation** Summary: release of information  Phone Note Call from Patient Call back at Home Phone [phone]   Caller: pt Reason for Call: Talk to Nurse Summary of Call: pt called today because she recieved release of information form in the mail to sign so we could get records from [facility], she has never been seen at [facility]. Initial call taken by: Faythe Ghee,  October 26, 2009 2:49 PM  Follow-up for Phone Call        Carlyle Lipa, pt's home healthcare nurse from [place], stated numerous time that pt. was released from [facility]. but pt. was released from [facility]. in [place]. Pt. was asked to disregard release letter. We have requested records from [facility]. and will forward to Dr. Diona Browner. Copy of last phone note copied to this phone note.   Larita Fife Kristapher [place] LPN  October 26, 2009 3:38 PM    Selena Batten , home healthcare nurse for [place], states that pt's HR has been elevated.  She also states that Mrs. Naeem was recently released from [facility]. on or around the 18th of July for A-fib.  Selena Batten is advised to consult with PMD and/or cardiologist that treated pt. while in [facility]. as we do not have records from that stay.  Waiting for pt's signature to fax [facility] hosp. for records. Initial call taken by: Larita Fife Collin Hendley LPN,  October 24, 2009 1:02 PM  Follow-up by: Larita Fife Thatiana Renbarger LPN,  October 26, 2009 3:20 PM

## 2010-04-23 NOTE — Letter (Signed)
Summary: 10-01-09 CONSULTATION FROM [facility]  10-01-09 CONSULTATION FROM [facility]   Imported By: Faythe Ghee 10/26/2009 16:38:48  _____________________________________________________________________  External Attachment:    Type:   Image     Comment:   External Document

## 2010-04-23 NOTE — Miscellaneous (Signed)
Summary: labs bmp.11/22/2008  Clinical Lists Changes  Observations: Added new observation of GFR: 104.82 mL/min (11/22/2008 10:33) Added new observation of CREATININE: 9.1 mg/dL (40/98/1191 47:82) Added new observation of BUN: 7 mg/dL (95/62/1308 65:78) Added new observation of BG RANDOM: 90 mg/dL (46/96/2952 84:13) Added new observation of CO2 PLSM/SER: 32 meq/L (11/22/2008 10:33) Added new observation of CL SERUM: 107 meq/L (11/22/2008 10:33) Added new observation of K SERUM: 4.8 meq/L (11/22/2008 10:33) Added new observation of NA: 143 meq/L (11/22/2008 10:33)

## 2010-04-23 NOTE — Cardiovascular Report (Signed)
Summary: order  order   Imported By: Faythe Ghee 12/12/2009 11:14:53  _____________________________________________________________________  External Attachment:    Type:   Image     Comment:   External Document

## 2010-04-23 NOTE — Letter (Signed)
Summary: 09-21-09 ECHO FROM [facility]  09-21-09 ECHO FROM [facility]   Imported By: Faythe Ghee 10/26/2009 16:37:51  _____________________________________________________________________  External Attachment:    Type:   Image     Comment:   External Document

## 2010-04-23 NOTE — Assessment & Plan Note (Signed)
Summary: rov/per tammy  Nurse Visit   Vital Signs:  Patient profile:   75 year old female Height:      65 inches Weight:      120 pounds BMI:     20.04 O2 Sat:      87 % on Room air Temp:     97.1 degrees F oral Pulse rate:   105 / minute Pulse (ortho):   106 / minute BP sitting:   118 / 65  (left arm) BP standing:   100 / 64  Vitals Entered By: Teressa Lower RN (October 30, 2009 10:15 AM)  O2 Flow:  Room air  Serial Vital Signs/Assessments:  Time      Position  BP       Pulse  Resp  Temp     By 10:20am   Lying LA  107/59   107                   Teressa Lower RN 10:20am   Sitting   115/68   99                    Teressa Lower RN 10:20am   Standing  100/64   106                   Teressa Lower RN  Comments: 10:20am no symptoms during ortho vs By: Teressa Lower RN    Visit Type:  nurse visit Referring Provider:  Nona Dell Primary Provider:  Dr. Doreen Beam   History of Present Illness: S: [facility] nurses call regularly regarding pt's fluctuating heart rate B:pt has [employer] pacemaker, ekg today show atrial fib rate 93 A: pt c/o profound weakness, listless, nausea and vomitng when she takes diltaizem medication... she took her verapamil instead this morning and had no symptoms of nausea at the nurse visit. 10/31/09 pt called this am and stated she had no nausea yesterday at all, and she took the verapamil 120mg  again today , she was taking verapamil 240mg  daily prior to changing to diltiazem   Preventive Screening-Counseling & Management  Alcohol-Tobacco     Smoking Status: quit > 6 months  Current Medications (verified): 1)  Spiriva Handihaler 18 Mcg Caps (Tiotropium Bromide Monohydrate) .... Once Daily 2)  Advair Hfa 115-21 Mcg/act Aero (Fluticasone-Salmeterol) .... 2 Puffs Two Times A Day 3)  Ventolin Hfa 108 (90 Base) Mcg/act Aers (Albuterol Sulfate) .... Two Puffs Up To Four Times Per Day As Needed 4)  Albuterol Sulfate 0.63 Mg/45ml Nebu  (Albuterol Sulfate) .... 2 Puffs As Needed 5)  Coumadin 5 Mg Tabs (Warfarin Sodium) .... As Directed 6)  Atenolol 50 Mg Tabs (Atenolol) .... Take 1/2 Tablet By Mouth Daily 7)  Cardizem La 240 Mg Xr24h-Tab (Diltiazem Hcl Coated Beads) .... Take 1 Tablet By Mouth Once A Day 8)  Evista 60 Mg Tabs (Raloxifene Hcl) .Marland Kitchen.. 1 Tab Daily(Pt Out At This Time) 9)  Digoxin 0.125 Mg Tabs (Digoxin) .... Take 1 Tablet By Mouth Once Daily 10)  Promethazine Hcl 25 Mg Tabs (Promethazine Hcl) .... Take 1 Tablet By Mouth Every 4 Hrs As Needed For Nausea 11)  Furosemide 40 Mg Tabs (Furosemide) .... Take 1 Tablet By Mouth Once A Day As Needed 12)  Alprazolam 0.25 Mg Tabs (Alprazolam) .... Take 1 Tablet By Mouth Two Times A Day As Needed  Allergies (verified): 1)  ! Sulfa  Appended Document: rov/per tammy Reviewed.  If she feels better  with Verapamil and heart is reasonably well controlled, then could switch from Diltiazem back to Verapamil.  Otherwise continue Atenolol and Digoxin.  Please get records from [facility] regarding evaluation there.  Appended Document: rov/per tammy She was never at [facility] that was an error in communication from home health.  Pt will restart verapamil at 240mg  daily, the last dose she was on.

## 2010-04-23 NOTE — Letter (Signed)
Summary: 09-20-09 CONSULTATION FROM [facility]  09-20-09 CONSULTATION FROM [facility]   Imported By: Faythe Ghee 10/26/2009 16:37:30  _____________________________________________________________________  External Attachment:    Type:   Image     Comment:   External Document

## 2010-04-23 NOTE — Progress Notes (Signed)
  Phone Note From Other Clinic   Caller: Nurse Penni Bombard Call For: heart rate Summary of Call: nurse stated pt has been having rhythmn ranging from 30's-120's, average 90's, pt is complaining of nausea and weakness since she started diltiazem.  Wt has been stable at 119-121 Asked nurse to fax [place] her trends from the CHF monitor- none received This pt was recently released from [facility]- records scanned into EMR Initial call taken by: Teressa Lower RN,  October 29, 2009 5:00 PM  Follow-up for Phone Call        Please discuss this with Larita Fife.  Apparently patient also seen at [facility] - I have seen no records.  [facility] records were reviewed.  It is very unlikely that her heart rate has been "30" since she has a pacemaker.  We should have her come by office for device interrogation and review of current medications and doses in order to best determine next step. Follow-up by: Loreli Slot, MD, [facility],  October 29, 2009 5:08 PM  Additional Follow-up for Phone Call Additional follow up Details #1::        Nurse visit 10/30/09 10:00am Additional Follow-up by: Teressa Lower RN,  October 29, 2009 5:20 PM

## 2010-04-23 NOTE — Assessment & Plan Note (Signed)
Summary: 3 wk f/u per checkout on 11/13/09/tg   Visit Type:  Follow-up Referring Provider:  Dr. Lewayne Bunting Primary Provider:  Dr. Doreen Beam   History of Present Illness: 75 year old woman presents for followup. She was last seen on 23 August. Medication adjustments have been made related to recurrent rapid atrial arrhythmias. Atenolol was increased to 50 mg daily at the last visit.  She reports doing very well the present regimen, heart rate in the 60s today. She does note some relative increase in heart rate in the mornings before she takes her medications. Breathing status reportedly stable. She is using oxygen as needed, mainly at nighttime.  Patient had device interrogation today and is nearing EOL. She is being scheduled for battery change with Dr. [name].  Clinical Review Panels:  Echocardiogram Echocardiogram Normal left ventricular chamber size with mild LVH, LVEF greater than 65%, diastolic dysfunction, mitral annular calcification with trace mitral regurgitation, aortic annular calcification, device wire noted in right ventricle, mild tricuspid regurgitation, RVSP 42 mm mercury, trivial pericardial effusion. (09/21/2009)    Current Medications (verified): 1)  Spiriva Handihaler 18 Mcg Caps (Tiotropium Bromide Monohydrate) .... Once Daily 2)  Advair Hfa 115-21 Mcg/act Aero (Fluticasone-Salmeterol) .... 2 Puffs Two Times A Day 3)  Ventolin Hfa 108 (90 Base) Mcg/act Aers (Albuterol Sulfate) .... Two Puffs Up To Four Times Per Day As Needed 4)  Albuterol Sulfate 0.63 Mg/1ml Nebu (Albuterol Sulfate) .... 2 Puffs As Needed 5)  Coumadin 5 Mg Tabs (Warfarin Sodium) .... As Directed 6)  Atenolol 50 Mg Tabs (Atenolol) .... Take 1 Tablet By Mouth Daily 7)  Verapamil Hcl Cr 240 Mg Cr-Tabs (Verapamil Hcl) .... Take 1 Tablet By Mouth Daily 8)  Evista 60 Mg Tabs (Raloxifene Hcl) .Marland Kitchen.. 1 Tab Daily(Pt Out At This Time) 9)  Digoxin 0.125 Mg Tabs (Digoxin) .... Take 1 Tablet By Mouth Once  Daily 10)  Furosemide 40 Mg Tabs (Furosemide) .... Take 1 Tablet By Mouth Once A Day As Needed 11)  Alprazolam 0.25 Mg Tabs (Alprazolam) .... Take 1 Tablet By Mouth Two Times A Day As Needed 12)  Keflex 500 Mg Caps (Cephalexin) .... Take 1 Tab Three Times A Day  Allergies (verified): 1)  ! Sulfa  Past History:  Past Medical History: Last updated: 12/07/2008 Osteoporosis Diverticulosis Renal Cyst Hyperthyroidism C. difficile colitis December 2006 Lumba-sacral DJD COPD      - PFT 10/05/08 FEV1 0.89(43%), FVC 1.84(62%), FEV1% 49, TLC 5.64(120%), DLCO 38% Hypoxemia      - Uses nocturnal supplemental oxygen Paroxysmal atrial fibrillation Sick Sinus Syndrome  Past Surgical History: Last updated: 11/28/2008 Right total hip replacement  Social History: Last updated: 11/22/2008 Retired.  Worked at [employer].  Quit drinking in 1994.  Smoked 1 ppd for 20 yrs, and quit in 2005.  Review of Systems       The patient complains of dyspnea on exertion.  The patient denies anorexia, fever, chest pain, syncope, peripheral edema, prolonged cough, melena, and hematochezia.         Otherwise reviewed and negative.  Vital Signs:  Patient profile:   75 year old female Weight:      121 pounds BMI:     20.21 Pulse rate:   65 / minute BP sitting:   113 / 58  (right arm)  Vitals Entered By: Dreama Saa, CNA (December 05, 2009 9:28 AM)  Physical Exam  Additional Exam:  Chronically ill-appearing woman in no acute distress. HEENT: Conjunctiva and lids normal, oropharynx with  poor dentition. Neck: Supple, no elevated jugular venous pressure, no thyromegaly. Cardiac: Regular rate and rhythm, distant, no S3 gallop. Lungs: Clear with diminished breath sounds throughout, nonlabored, no wheezing. Abdomen: Soft, no hepatomegaly. Extremities: Mild venous stasis, distal pulses one plus. Skin: Warm and dry. Musculoskeletal: No kyphosis. Neuropsychiatric: Alert and oriented x3, affect  appropriate.   PPM Specifications Following MD:  Lewayne Bunting, MD     PPM Vendor:  Medtronic     PPM Model Number:  [id number]     PPM Serial Number:  [id number] PPM DOI:  06/20/2002     PPM Implanting MD:  Lewayne Bunting, MD  Lead 1    Location: RA     DOI: 06/20/2002     Model #: [id number]     Serial #: [id number]     Status: active Lead 2    Location: RV     DOI: 06/20/2002     Model #: [id number]     Serial #: [id number]     Status: active  Magnet Response Rate:  BOL 85 ERI 65  Indications:  T/B SYNDROME WITH PAF   PPM Follow Up Pacer Dependent:  Yes      Episodes Coumadin:  Yes  Parameters Mode:  DDDR     Lower Rate Limit:  60     Upper Rate Limit:  130 Paced AV Delay:  250     Sensed AV Delay:  250  Impression & Recommendations:  Problem # 1:  PAROXYSMAL ATRIAL FIBRILLATION (ICD-427.31)  Much better controlled on present regimen. No changes are being made. I asked her to keep an eye on morning heart rates. She could consider spreading out her medications over the day for some overlap. Followup planned for 3 months.  Her updated medication list for this problem includes:    Coumadin 5 Mg Tabs (Warfarin sodium) .Marland Kitchen... As directed    Atenolol 50 Mg Tabs (Atenolol) .Marland Kitchen... Take 1 tablet by mouth daily    Digoxin 0.125 Mg Tabs (Digoxin) .Marland Kitchen... Take 1 tablet by mouth once daily  Her updated medication list for this problem includes:    Coumadin 5 Mg Tabs (Warfarin sodium) .Marland Kitchen... As directed    Atenolol 50 Mg Tabs (Atenolol) .Marland Kitchen... Take 1 tablet by mouth daily    Digoxin 0.125 Mg Tabs (Digoxin) .Marland Kitchen... Take 1 tablet by mouth once daily  Future Orders: T-Basic Metabolic Panel [id number]) ... 12/10/2009 T-CBC w/Diff ([id number]) ... 12/10/2009 T-PTT ([id number]) ... 12/10/2009 T-Protime, Auto ([id number]) ... 12/10/2009  Problem # 2:  CARDIAC PACEMAKER IN SITU (ICD-V45.01)  Followed by Dr. [name]. Device interrogation shows nearing EOL. Battery change out being scheduled with Dr. [name] per  the EP team.  Orders: Bi-V Pacer (Bi-V Pacer)Future Orders: T-Basic Metabolic Panel [id number]) ... 12/10/2009 T-CBC w/Diff ([id number]) ... 12/10/2009 T-PTT ([id number]) ... 12/10/2009 T-Protime, Auto ([id number]) ... 12/10/2009  Problem # 3:  ESSENTIAL HYPERTENSION, BENIGN (ICD-401.1)  Blood pressure well controlled today.  Her updated medication list for this problem includes:    Atenolol 50 Mg Tabs (Atenolol) .Marland Kitchen... Take 1 tablet by mouth daily    Verapamil Hcl Cr 240 Mg Cr-tabs (Verapamil hcl) .Marland Kitchen... Take 1 tablet by mouth daily    Furosemide 40 Mg Tabs (Furosemide) .Marland Kitchen... Take 1 tablet by mouth once a day as needed  Her updated medication list for this problem includes:    Atenolol 50 Mg Tabs (Atenolol) .Marland Kitchen... Take 1 tablet by mouth daily    Verapamil Hcl Cr 240 Mg Cr-tabs (Verapamil hcl) .Marland Kitchen... Take  1 tablet by mouth daily    Furosemide 40 Mg Tabs (Furosemide) .Marland Kitchen... Take 1 tablet by mouth once a day as needed  Patient Instructions: 1)  Your physician recommends that you schedule a follow-up appointment in: after procedure 2)  Your physician has recommended that you have a pacemaker generator change scheduled for 12/13/09

## 2010-04-25 NOTE — Miscellaneous (Signed)
Summary: cardiac  cardiac   Imported By: Faythe Ghee 04/11/2010 11:06:39  _____________________________________________________________________  External Attachment:    Type:   Image     Comment:   External Document

## 2010-05-02 DIAGNOSIS — I369 Nonrheumatic tricuspid valve disorder, unspecified: Secondary | ICD-10-CM

## 2010-05-28 ENCOUNTER — Encounter: Payer: Self-pay | Admitting: Adult Health

## 2010-05-28 ENCOUNTER — Ambulatory Visit (INDEPENDENT_AMBULATORY_CARE_PROVIDER_SITE_OTHER): Payer: Medicare Other | Admitting: Adult Health

## 2010-05-28 DIAGNOSIS — Z950 Presence of cardiac pacemaker: Secondary | ICD-10-CM

## 2010-05-28 DIAGNOSIS — I442 Atrioventricular block, complete: Secondary | ICD-10-CM

## 2010-05-28 DIAGNOSIS — I4891 Unspecified atrial fibrillation: Secondary | ICD-10-CM

## 2010-05-29 ENCOUNTER — Encounter: Payer: Self-pay | Admitting: Adult Health

## 2010-06-04 NOTE — Assessment & Plan Note (Signed)
Summary: ATRIAL FIB PER  [facility] REC ON FIL E/05/24/10/TMJ   Visit Type:  Follow-up Referring Provider:  Dr. Lewayne Bunting Primary Provider:  Dr. Doreen Beam  CC:  no cardiology complaints.  History of Present Illness: [name] is a 75 y/o CF with known history of SSS s/p  successful pacemaker pulse generator replacement with a Medtronic Adapta L dual-chamber pacemaker for symptomatic bradycardia in Sept 2011, permanent atrial fib on coumadin, COPD who was recently hospitalized in January of this year at [facility] for dehydration, pneumonia.  She was taken off of atenolol 50mg  daily secondary to hypotension by Dr. Sherril Croon.  She has had difficult to control atrial fib rate with multiple medication regimines, to include amiodarone which she could not tolerate.  She has been stable on atenolol 50mg  daily, verapamil 240mg  daily, and digoxin 0.125mg  daily per last visit with Dr. Diona Browner in Sept of 2011.    She comes today for follow-up and is tachycardic and mildly hypotensive. She is asymptomatic with this and cannot tell that the rate is elevated.  She denies chest pain or shortness of breath. It is noted that she has lost 7 lbs since last visit. She states that she is not just getting her appetite back.  Dr. Sherril Croon has ordered labs drawn  yesterday, but results are not available at this time.    Current Medications (verified): 1)  Spiriva Handihaler 18 Mcg Caps (Tiotropium Bromide Monohydrate) .... Once Daily 2)  Advair Hfa 115-21 Mcg/act Aero (Fluticasone-Salmeterol) .... 2 Puffs Two Times A Day 3)  Ventolin Hfa 108 (90 Base) Mcg/act Aers (Albuterol Sulfate) .... Two Puffs Up To Four Times Per Day As Needed 4)  Coumadin 5 Mg Tabs (Warfarin Sodium) .... As Directed 5)  Verapamil Hcl Cr 240 Mg Cr-Tabs (Verapamil Hcl) .... Take 1 Tablet By Mouth Daily 6)  Evista 60 Mg Tabs (Raloxifene Hcl) .Marland Kitchen.. 1 Tab Daily(Pt Out At This Time) 7)  Digoxin 0.125 Mg Tabs (Digoxin) .... Take 1 Tablet By Mouth  Once Daily 8)  Furosemide 40 Mg Tabs (Furosemide) .... Take 1 Tablet By Mouth Once A Day As Needed 9)  Alprazolam 0.25 Mg Tabs (Alprazolam) .... Take 1 Tablet By Mouth Two Times A Day As Needed 10)  Atenolol 25 Mg Tabs (Atenolol) .... Take 1 Tablet By Mouth Once Daily  Allergies (verified): 1)  ! Sulfa  Comments:  Nurse/Medical Assistant: patient and i reviewed med list from previous ov and stated that all meds are the same except hospital stopped her atenolol and albuterol [facility]  Past History:  Past medical, surgical, family and social histories (including risk factors) reviewed, and no changes noted (except as noted below).  Past Medical History: Reviewed history from 12/07/2008 and no changes required. Osteoporosis Diverticulosis Renal Cyst Hyperthyroidism C. difficile colitis December 2006 Lumba-sacral DJD COPD      - PFT 10/05/08 FEV1 0.89(43%), FVC 1.84(62%), FEV1% 49, TLC 5.64(120%), DLCO 38% Hypoxemia      - Uses nocturnal supplemental oxygen Paroxysmal atrial fibrillation Sick Sinus Syndrome  Past Surgical History: Reviewed history from 11/28/2008 and no changes required. Right total hip replacement  Family History: Reviewed history from 11/22/2008 and no changes required. Mother - lung cancer Brother - heart disease  Social History: Reviewed history from 11/22/2008 and no changes required. Retired.  Worked at [employer].  Quit drinking in 1994.  Smoked 1 ppd for 20 yrs, and quit in 2005.  Review of Systems       Fatigue  All other systems have been reviewed and are negative unless stated above.   Vital Signs:  Patient profile:   75 year old female Weight:      113 pounds BMI:     18.87 Pulse rate:   132 / minute BP sitting:   119 / 79  (left arm)  Vitals Entered By: Dreama Saa, CNA (May 28, 2010 2:31 PM)  Physical Exam  General:  Well developed, well nourished, in no acute distress.normal appearance.   Lungs:  Clear bilaterally to  auscultation and percussion. Heart:  Irregular rhythm.  Systolic murmur heard best at the apex.  No carotid bruits.  Pulses are palpable.  No edema. Abdomen:  Bowel sounds positive; abdomen soft and non-tender without masses, organomegaly, or hernias noted. No hepatosplenomegaly. Msk:  Back normal, normal gait. Muscle strength and tone normal. Pulses:  pulses normal in all 4 extremities Extremities:  No clubbing or cyanosis. Neurologic:  Alert and oriented x 3. Psych:  depressed affect.     EKG  Procedure date:  05/28/2010  Findings:      Atrial fibrillation with an uncontrolled ventricular response rate of: 136 bpm  PPM Specifications Following MD:  Lewayne Bunting, MD     PPM Vendor:  Medtronic     PPM Model Number:  [id number]     PPM Serial Number:  [id number] PPM DOI:  06/20/2002     PPM Implanting MD:  Lewayne Bunting, MD  Lead 1    Location: RA     DOI: 06/20/2002     Model #: [id number]     Serial #: [id number]     Status: active Lead 2    Location: RV     DOI: 06/20/2002     Model #: [id number]     Serial #: [id number]     Status: active  Magnet Response Rate:  BOL 85 ERI 65  Indications:  T/B SYNDROME WITH PAF   PPM Follow Up Pacer Dependent:  No      Episodes Coumadin:  Yes  Parameters Mode:  VVI     Lower Rate Limit:  65     Upper Rate Limit:  130 Paced AV Delay:  250     Sensed AV Delay:  250  Impression & Recommendations:  Problem # 1:  PAROXYSMAL ATRIAL FIBRILLATION (ICD-427.31) Heart rate is not well controlled at present off of the weekend. I have discussed this with Dr. Diona Browner who is very familiar with this patient.  He advises that we put the patient back atenolol as she tolerated this well. He is aware that her HR has been very difficult to control in the past. Any changes could cause significant HR elevations.  The patient refused to take full dose of 50mg  daily.  She states she will take 25mg  daily as she is afraid that her BP will drop too low. She will see Dr. Diona Browner  in one month or call if she becomes symptomatic. She will continue her coumadin as directed. This is followed by Dr. Sherril Croon. The following medications were removed from the medication list:    Atenolol 50 Mg Tabs (Atenolol) .Marland Kitchen... Take 1 tablet by mouth daily Her updated medication list for this problem includes:    Coumadin 5 Mg Tabs (Warfarin sodium) .Marland Kitchen... As directed    Digoxin 0.125 Mg Tabs (Digoxin) .Marland Kitchen... Take 1 tablet by mouth once daily    Atenolol 25 Mg Tabs (Atenolol) .Marland Kitchen... Take 1 tablet by mouth once daily  Problem #  2:  CARDIAC PACEMAKER IN SITU (ICD-V45.01) She will have her pacemaker interrogated in next available appointment with Dr. [name] as she has not had evaluation of new device since implantation.  Problem # 3:  COPD (ICD-496) Assessment: Unchanged  The following medications were removed from the medication list:    Albuterol Sulfate 0.63 Mg/48ml Nebu (Albuterol sulfate) .Marland Kitchen... 2 puffs as needed Her updated medication list for this problem includes:    Spiriva Handihaler 18 Mcg Caps (Tiotropium bromide monohydrate) ..... Once daily    Advair Hfa 115-21 Mcg/act Aero (Fluticasone-salmeterol) .Marland Kitchen... 2 puffs two times a day    Ventolin Hfa 108 (90 Base) Mcg/act Aers (Albuterol sulfate) .Marland Kitchen..Marland Kitchen Two puffs up to four times per day as needed  Patient Instructions: 1)  Your physician recommends that you schedule a follow-up appointment in: 1 month 2)  Your physician has recommended you make the following change in your medication: start taking Atenolol at 25mg  by mouth once daily  Prescriptions: ATENOLOL 25 MG TABS (ATENOLOL) take 1 tablet by mouth once daily  #30 x 3   Entered by:   Larita Fife Via LPN   Authorized by:   Joni Reining, NP   Signed by:   Larita Fife Via LPN on 84/16/6063   Method used:   Electronically to        [facility]* (retail)       [address]       [place], [place]  [address]       Ph: [phone]       Fax: [phone]   RxID:    [id number]

## 2010-06-06 LAB — PROTIME-INR: INR: 2.34 — ABNORMAL HIGH (ref 0.00–1.49)

## 2010-06-06 LAB — SURGICAL PCR SCREEN: Staphylococcus aureus: NEGATIVE

## 2010-06-24 ENCOUNTER — Encounter: Payer: Self-pay | Admitting: Cardiology

## 2010-06-25 ENCOUNTER — Ambulatory Visit (INDEPENDENT_AMBULATORY_CARE_PROVIDER_SITE_OTHER): Payer: Medicare Other | Admitting: Cardiology

## 2010-06-25 ENCOUNTER — Encounter: Payer: Self-pay | Admitting: Cardiology

## 2010-06-25 VITALS — BP 87/54 | HR 77 | Ht 65.0 in | Wt 111.0 lb

## 2010-06-25 DIAGNOSIS — I4891 Unspecified atrial fibrillation: Secondary | ICD-10-CM

## 2010-06-25 DIAGNOSIS — J449 Chronic obstructive pulmonary disease, unspecified: Secondary | ICD-10-CM

## 2010-06-25 DIAGNOSIS — I10 Essential (primary) hypertension: Secondary | ICD-10-CM

## 2010-06-25 NOTE — Assessment & Plan Note (Signed)
This represents a contributor to difficulties with her heart rate control overall. Recent episode of "bronchitis" still lingering. She reports followup with Dr. Sherril Croon soon. Might also consider trying over-the-counter Zyrtec in case there is an allergic component.

## 2010-06-25 NOTE — Patient Instructions (Addendum)
**Note De-identified Kieth Hartis Obfuscation** Your physician recommends that you schedule a follow-up appointment in: 3 months. Your physician recommends that you continue on your current medications as directed. Please refer to the Current Medication list given to you today. 

## 2010-06-25 NOTE — Assessment & Plan Note (Signed)
Blood pressure has been somewhat low, although no dizziness or syncope. Seems to be tolerating the present medications, which are aimed at heart rate control mainly.

## 2010-06-25 NOTE — Progress Notes (Signed)
Clinical Summary [name] is a 75 y.o.female presenting for routine followup. She was just recently seen by [name] in early March following a hospitalization at [facility]. Medication adjustments were made related to obtaining better rate control of atrial fibrillation, in the setting of relatively low blood pressures.  She has much better heart rate control today, and states that she is tolerating the medicines outlined below. She is not taking Lasix regularly.  No reported bleeding problems on Coumadin.  Continues to struggle with a recent "bronchitis" and thinks that she may also have some contribution from allergies   Allergies  Allergen Reactions  . Sulfonamide Derivatives     REACTION: rash    Current outpatient prescriptions:albuterol (PROVENTIL HFA) 108 (90 BASE) MCG/ACT inhaler, Inhale 2 puffs into the lungs every 4 (four) hours as needed.  , Disp: , Rfl: ;  ALPRAZolam (XANAX) 0.25 MG tablet, Take 0.25 mg by mouth 2 (two) times daily as needed.  , Disp: , Rfl: ;  atenolol (TENORMIN) 25 MG tablet, Take 25 mg by mouth daily.  , Disp: , Rfl: ;  digoxin (LANOXIN) 0.125 MG tablet, Take 125 mcg by mouth daily.  , Disp: , Rfl:  fluticasone-salmeterol (ADVAIR HFA) 115-21 MCG/ACT inhaler, Inhale 2 puffs into the lungs 2 (two) times daily.  , Disp: , Rfl: ;  furosemide (LASIX) 40 MG tablet, Take 40 mg by mouth daily. , Disp: , Rfl: ;  raloxifene (EVISTA) 60 MG tablet, Take 60 mg by mouth daily.  , Disp: , Rfl: ;  tiotropium (SPIRIVA) 18 MCG inhalation capsule, Place 18 mcg into inhaler and inhale daily.  , Disp: , Rfl:  verapamil (COVERA HS) 240 MG (CO) 24 hr tablet, Take 240 mg by mouth daily.  , Disp: , Rfl: ;  warfarin (COUMADIN) 5 MG tablet, Take 5 mg by mouth. As directed by coumadin clinic  , Disp: , Rfl:   Past Medical History  Diagnosis Date  . Osteoporosis   . Diverticulosis   . Renal cyst   . Hyperthyroidism   . C. difficile colitis     12/06  . Lumbar and sacral arthritis    . COPD (chronic obstructive pulmonary disease)     PFT 10/05/08 - FEV1 0.89(43%), FVC 1.84(62%), FEV1% 49, TLC 5.64(120%), DLCO 38%  . Hypoxemia     Nocturnal oxygen  . Atrial fibrillation     Paroxysmal  . Sick sinus syndrome     Medtronic PPM    Social History [name] reports that she has quit smoking. Her smoking use included Cigarettes. She has a 20 pack-year smoking history. She has never used smokeless tobacco. [name] reports that she does not drink alcohol.  Review of Systems No fevers, chills, or unusual weight change. No chest pain. Positive cough, no hemoptysis or wheezing. No palpitations, dizziness, or syncope. No dysphasia or odynophagia. Stable appetite with no abdominal pain, melena, or hematochezia. No orthopnea, PND, or lower extremity edema. No focal motor weakness, memory problems, or speech deficits. Otherwise systems reviewed and negative except as already outlined.   Physical Examination Filed Vitals:   06/25/10 0914  BP: 87/54  Pulse: 77  Chronically ill-appearing woman in no acute distress. HEENT: Conjunctiva and lids normal, oropharynx with poor dentition. Neck: Supple, no elevated jugular venous pressure, no thyromegaly. Cardiac: Regular rate and rhythm, distant, no S3 gallop. Lungs: Clear with diminished breath sounds throughout, nonlabored, no wheezing. Abdomen: Soft, no hepatomegaly. Extremities: Mild venous stasis, distal pulses one plus. Skin: Warm  and dry. Musculoskeletal: No kyphosis. Neuropsychiatric: Alert and oriented x3, affect appropriate.    Problem List and Plan

## 2010-06-25 NOTE — Assessment & Plan Note (Signed)
Heart rate control better, continue present regimen including Coumadin.

## 2010-07-01 LAB — GLUCOSE, CAPILLARY: Glucose-Capillary: 111 mg/dL — ABNORMAL HIGH (ref 70–99)

## 2010-07-23 ENCOUNTER — Encounter: Payer: Medicare Other | Admitting: Internal Medicine

## 2010-08-06 NOTE — Assessment & Plan Note (Signed)
[facility]                       [place] CARDIOLOG[facility] OFFICE NOTE   ALANIS, CLIFT                   MRN:          [id number]  DATE:02/02/2008                            DOB:          10-01-1935    PRIMARY CARE PHYSICIAN:  Doreen Beam, MD   REASON FOR VISIT:  Scheduled followup.   HISTORY OF PRESENT ILLNESS:  Ms. [name] was actually last seen in our  [facility] by Dr. Andee Lineman in April 2009.  She has a history of chronic  obstructive pulmonary disease on oxygen, as well as paroxysmal atrial  fibrillation with tachybrady syndrome, status post Medtronic pacemaker  placement in March 2004 by Dr. Graciela Husbands.  It looks as if this device was  last interrogated in June 2008.  She has been managed with Coumadin and  had an echocardiogram done in July 2008 demonstrating a left ventricular  ejection fraction of 55-60% with grade 1 diastolic dysfunction, mild  mitral regurgitation, and mild tricuspid regurgitation.  Carotid  Dopplers also in July 2008 demonstrated no obstructive stenoses.  An  adenosine Cardiolite in September 2005 was negative for ischemia with an  ejection fraction of 63%.  Ms. Maudlin states that she has been on her  typical state of health except for a cold over the last few weeks.  She also describes intermittent foot and ankle edema, typically  dependent, but not noted every day.  She was previously on a low-dose  diuretic, although this was discontinued several months ago.  She also  states that she is wearing compression stockings, but did not achieve  complete improvement with this strategy.   ALLERGIES:  SULFA drugs.   MEDICATIONS:  1. Evista  60 mg p.o. daily.  2. Coumadin as directed by Dr. Sherril Croon.  3. Advair 2 puffs b.i.d.  4. Spiriva inhaler daily.  5. Albuterol nebulizer treatments with Atrovent q.4-6 h.  6. Verapamil 240 mg p.o. daily.  7. Atenolol 25 mg p.o. b.i.d.  8. Centrum Silver daily.  9. Caltrate b.i.d.  10.Potassium and Magnesium oxide supplements.  11.Prilosec 20 mg p.o. daily.  12.Xanax 0.25 mg p.o. daily.   REVIEW OF SYSTEMS:  As described in the history of present illness.  Otherwise negative.   PHYSICAL EXAMINATION:  VITAL SIGNS:  Blood pressure is 120/70, heart  rate is 76, and weight is 130 pounds.  GENERAL:  The patient is comfortable in no acute distress.  HEENT:  Conjunctiva is normal.  Oropharynx is clear.  NECK:  Supple.  No elevated jugular venous pressure.  No loud carotid  bruits.  LUNGS:  Exhibit coarse diminished breath sounds.  Nonlabored breathing  and no wheezing noted.  CARDIAC:  Regular rate and rhythm.  No loud  systolic murmur or pericardial rub.  No S3 gallop.  ABDOMEN:  Soft and  nontender.  Normoactive bowel sounds.  EXTREMITIES:  Exhibit no significant pitting edema today.  Distal pulses  are 2+.  SKIN:  Warm and dry.  MUSCULOSKELETAL:  No kyphosis noted.  NEUROPSYCHIATRIC:  The patient is alert and oriented x3.  Affect is  appropriate.   IMPRESSION AND RECOMMENDATIONS:  1. Intermittent foot  and ankle edema.  I suspect this may be related      to right heart symptoms exacerbated by her chronic obstructive      pulmonary disease.  Previous documentation of left ventricular      function was normal as of 2008.  I would like to repeat a 2-D      echocardiogram to assess right and left ventricular systolic      function and I have provided her a prescription for Lasix 20 mg      p.o. p.r.n. edema.  She is already on a potassium supplement.  We      will see how she does symptomatically and I will plan to see her      back over the next 6 months.  2. History of paroxysmal atrial fibrillation and tachycardia-      bradycardia syndrome, status post Medtronic pacemaker placement in      2005.  We will arrange followup with the [facility] here in      [place] and she will otherwise continue on Coumadin as directed      by Dr. Sherril Croon.     Jonelle Sidle, MD  Electronically Signed    SGM/MedQ  DD: 02/02/2008  DT: 02/03/2008  Job #: [id number]   cc:   Doreen Beam, MD

## 2010-08-06 NOTE — Assessment & Plan Note (Signed)
[facility]                         ELECTROPHYSIOLOGY OFFICE NOTE   ANA, WOODROOF                   MRN:          [id number]  DATE:09/10/2006                            DOB:          30-Sep-1935    Ms. Eskelson was seen today in the [facility] device clinic for followup of her  Medtronic Kappa pacemaker implanted on 06/20/2002 for tachy-brady  syndrome.  Interrogation of her device demonstrates P-waves of 2 to 2.8  millivolts  with an atrial impedence of 553 ohms and a threshold of 0.75  volts at 0.4 milliseconds.  Her R-waves measured 11.2 to 15.68  millivolts  with an RV impedence of 712 ohms and with a threshold of 0.5  volts at 0.4 milliseconds.  Her battery voltage was 2.73 volts and she  is not pacemaker dependent.  She was in normal sinus rhythm today.  She  did have 694 atrial high rate episodes which represented 3.9% of her  total time, currently on Coumadin therapy.  She is programmed DDDR with  a low rate of 60 and an upper rate of 130.  Upon presentation, her AV  delays were set at 250 and 140.  Today we changed her sense AV to 250  milliseconds and she does have intrinsic conduction.  She is currently A-  pacing 46.3% of the time and V-pacing 5.7% of the time.  Ms. Marteney  will return to the clinic in 6 months for followup.      Gypsy Balsam, RN,BSN  Electronically Signed      Duke Salvia, MD, [facility]  Electronically Signed   AS/MedQ  DD: 09/10/2006  DT: 09/10/2006  Job #: [id number]

## 2010-08-06 NOTE — Letter (Signed)
September 14, 2008    [facility]  Pharmacy Operations  [address] [address]  [place], [place] [address]  Attn:  Appeals Department   RE:  [name], [name]  MRN:  [id number]  /  DOB:  06/22/1935   To Whom it May Concern:   [name] is a patient followed in our practice for paroxysmal atrial  fibrillation with tachybrady syndrome status post pacemaker  implantation.  She was seen by me in the office on August 25, 2008, for  followup.  Interrogation of her pacemaker demonstrated several mode  switching episodes with heart rates increasing into the 120 range while  she was in atrial fibrillation.  We therefore have tried to adjust her  medications.  She was previously on verapamil ER 240 mg daily.  She has  now been asked to start taking verapamil ER 240 mg in the morning, 120  mg in the evening.  The request for her medication has been denied.  I  believe the most convenient way for the patient to receive the  medication was to have 120 mg tablets to take 2 in the morning and 1 in  the evening to achieve the dose as outlined above.  I hope that this  letter will assist you in approving this medication for the patient.  Please feel free to contact our office with questions or concerns.    Sincerely,       Tereso Newcomer, PA-C  Electronically Signed      Jonelle Sidle, MD  Electronically Signed   SW/MedQ  DD: 09/14/2008  DT: 09/14/2008  Job #: [id number]   CC:    fax [phone] [employer]

## 2010-08-06 NOTE — Consult Note (Signed)
[name], [name]            ACCOUNT NO.:  [id number]   MEDICAL RECORD NO.:  [id number]          PATIENT TYPE:  INP   LOCATION:  1613                         FACILITY:  [facility]   PHYSICIAN:  Gerrit Friends. Dietrich Pates, MD, FACCDATE OF BIRTH:  05-27-1935   DATE OF CONSULTATION:  05/19/2007  DATE OF DISCHARGE:                                 CONSULTATION   REFERRING PHYSICIAN:  Dr. Charlann Boxer.   PRIMARY CARE PHYSICIAN:  Dr. Sherril Croon.   PRIMARY CARDIOLOGIST:  Dr. Diona Browner.   HISTORY OF PRESENT ILLNESS:  A 75 year old woman with a history of sick  sinus syndrome referred for evaluation of pulse irregularity.  She has a  history of paroxysmal atrial fibrillation; pacemaker interrogation shows  multiple episodes of rate switching.  This all presented only 4% of all  of her rhythms.  The pacemaker is programmed to DDR mode.   She does not have known coronary disease.  She does not have much in the  way of cardiovascular risk factors except for age and cigarette smoking,  which was discontinued last year.   Past medical history is otherwise notable for osteoarthritis, which  eventuated in a right total hip arthroplasty yesterday.  She also has a  history of osteoporosis.  She has diverticular disease.  There is a  history of COPD and hypothyroidism.  She has renal cysts that are  apparently benign.  Surgeries have included hysterectomy, hernia repair,  appendectomy, venous vein excision, carpal tunnel release, arthroscopic  knee surgery.   Current medications include atenolol 25 mg b.i.d., Os-Cal 500 mg b.i.d.,  Ancef 1 gram q.6 h, B12, Colace, iron supplement, Advair b.i.d.,  guaifenesin 600 mg b.i.d., vitamin, MiraLax, Evista 60 mg daily, Spiriva  18 mcg daily, verapamil 240 mg daily and warfarin, which was held for  her surgery.   She reports ALLERGIES TO SULFA DRUGS AND ADHESIVE TAPE.  SHE HAS HAD AN  ADVERSE REACTION TO ETHER ANESTHETIC.   SOCIAL HISTORY:  Lives in [place] with her daughter;  retired; 30-pack-year  history of cigarette smoking; no excessive use of alcohol.   FAMILY HISTORY:  Positive for neoplastic disease, CVA and coronary  disease in a half-brother.   REVIEW OF SYSTEMS:  Notable for a negative stress test that was  performed in 2005.  She uses oxygen at home nocturnally.  Vaccinations  are up to date.  She has noted increased ecchymosis since starting  Coumadin.  She has a history of hemorrhoids.  She follows a regular  diet.  She has some  urinary stress incontinence.  She requires  corrective lenses for near vision.  She has upper and lower dentures.  All other systems reviewed and are negative.   PHYSICAL EXAMINATION:  GENERAL:  A pleasant woman in no acute distress.  VITAL SIGNS:  The temperature is 98, blood pressure 110/65, heart rate  80 and regular, respirations 22, O2 saturation 90% on 2 liters.  Afebrile.  HEENT:  Anicteric sclerae; EOMs full; normal oral mucosa.  NECK:  No jugular venous distention; normal carotid upstrokes without  bruits.  ENDOCRINE:  No thyromegaly.  HEMATOPOIETIC:  No adenopathy.  PSYCHIATRIC:  Alert and oriented; normal affect.  SKIN:  No significant lesions.  LUNGS:  Decreased breath sounds at the bases; somewhat prolonged  expiratory phase.  CARDIAC:  Normal first and second heart sounds; no murmur or gallop  appreciated.  ABDOMEN:  Soft and nontender; no organomegaly.  EXTREMITIES:  Distal pulses intact; no edema.  NEUROLOGIC:  Symmetric strength and tone; normal cranial nerves.   EKG:  Atrial fibrillation with a rapid ventricular response earlier  today; nonspecific T-wave abnormality.   Chest x-ray:  Normal heart size; hyperinflation of the lungs;  interstitial changes in the lower lobes bilaterally.   Laboratory otherwise notable for a hemoglobin of 9.5, normal BUN and  creatinine, glucose 172.   IMPRESSION:  [name] had a recurrent atrial fibrillation.  Her  pattern appears to be proven short  episodes of AF.  It is possible that  the stress of surgery has made her more prone to this arrhythmia.  Although she spends a  relatively small percentage of time in atrial  fibrillation, she would benefit from better rate control.  We will  increase verapamil for now.  Higher dosing of beta blocker could result  in bronchospasm.   Her risk of thromboembolism is relatively small.  We can wait the 2 or 3  days that will be required for anticoagulation with warfarin to be  reestablished.   She had a fasting blood glucose that was elevated earlier today.  She  may have diabetes exacerbated by stress.  Glucose on admission was  normal.  We will check CBG and a hemoglobin A1c level.   We appreciate the request for consultation and will be happy to follow  this nice woman with you in the hospital.      Gerrit Friends. Dietrich Pates, MD, [facility]  Electronically Signed     RMR/MEDQ  D:  05/19/2007  T:  05/20/2007  Job:  [id number]

## 2010-08-06 NOTE — Assessment & Plan Note (Signed)
[facility]                         ELECTROPHYSIOLOGY OFFICE NOTE   [name], [name]                   MRN:          [id number]  DATE:03/01/2008                            DOB:          03/12/1936    [name] returns today for followup.  She is a very pleasant woman  with a history of symptomatic bradycardia and atrial fibrillation who is  status post pacemaker insertion.  This was performed back in March 2004  by Dr. Graciela Husbands.  She returns today for her initial EP visit in [place].  The patient has been followed in our [place] office and prior to that in  [place].  She has preferred to move to our [place] office.  She has seen  Dr. Diona Browner here and states that she likes coming to [place].  She  denies chest pain.  She has chronic dyspnea.  She has chronic COPD and  asthma.   MEDICINES:  1. Evista 60 a day.  2. Coumadin as directed.  3. Advair 2 puffs twice daily.  4. Spiriva inhaler daily.  5. Albuterol nebulizer and Atrovent several times a day.  6. Verapamil 240 mg daily.  7. Atenolol 25 twice a day.  8. Potassium supplement.  9. Prilosec daily.  10.Xanax 0.25 mg at night.   PHYSICAL EXAMINATION:  GENERAL:  She is a pleasant, well-appearing  elderly woman in no distress.  VITAL SIGNS:  Blood pressure was 120/80, the pulse 78 and regular,  respirations were 18, the weight was 130 pounds.  NECK:  No jugular venous distention.  LUNGS:  Clear bilaterally to auscultation.  No wheezes, rales, or  rhonchi are present.  There are very scattered wheezes, though no  rhonchi or rales present.  No increased work of breathing.  CARDIOVASCULAR:  Regular rate and rhythm.  Normal S1 and S2.  ABDOMEN:  Soft, nontender.  EXTREMITIES:  No edema.   Interrogation of pacemaker demonstrates Medtronic kappa 921.  P-waves  were 2 and the R-waves were 8.  The impedance 595 in the A, 790 in the  V, the threshold of 1 volt of 0.4 in the atrium, 0.5 at  0.4 in the right  ventricle.  Her battery voltage was 2.7 volts.  She was approximately a  year and a half of battery longevity remaining.  She was A-pacing 32% of  the time, V-pacing 1% of the time.  She was mode switched 6% of the  time.   IMPRESSION:  1. Symptomatic bradycardia.  2. Paroxysmal atrial fibrillation.  3. Hypertension.   DISCUSSION:  Overall, [name] is stable.  Her pacemaker is working  normally.  Her AFib is controlled.  Her COPD is controlled.  I will plan  to see the patient back in 1 year.  She will follow up in our Pacemaker  Clinic here in 6 months.  I have encouraged her to continue her current  regimen and actually to become more active.  We have asked that she  stops smoking cigarettes.     Doylene Canning. [name], MD  Electronically Signed    GWT/MedQ  DD: 03/01/2008  DT: 03/02/2008  Job #: [id number]
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
## 2010-08-06 NOTE — Op Note (Signed)
[name], [name]            ACCOUNT NO.:  [id number]   MEDICAL RECORD NO.:  [id number]          PATIENT TYPE:  INP   LOCATION:  0004                         FACILITY:  [facility]   PHYSICIAN:  Madlyn Frankel. Charlann Boxer, M.D.  DATE OF BIRTH:  04/05/1935   DATE OF PROCEDURE:  05/18/2007  DATE OF DISCHARGE:                               OPERATIVE REPORT   PREOPERATIVE DIAGNOSIS:  Right hip avascular necrosis.   POSTOPERATIVE DIAGNOSIS:  Right hip avascular necrosis.   PROCEDURE:  Right total hip replacement.   COMPONENTS USED:  DePuy hip system, size 50 pinnacle cup, single  cancellous screw, 36 metal neutral liner, a 6 standard Trilock stem,  with 36 plus 5 ball.   SURGEON:  Madlyn Frankel. Charlann Boxer, M.D.   ASSISTANT:  [name], P.A.-C.   ANESTHESIA:  Spinal due to pulmonary conditions.   DRAINS:  One.   ESTIMATED BLOOD LOSS:  300 mL.   INDICATIONS FOR PROCEDURE:  [name] is a 75 year old female who  presented to the office with a relatively acute onset of the hip pain  that began in October. Radiographs included CT scan that had been done  on the outside prior to evaluation indicating subchondral collapse of  the femoral head.  She does have chronic pulmonary conditions probably  resulted in the use of steroid in some fashion ultimately leading to the  underlying diagnosis of AVN. Given the persistence of her discomfort,  risks and benefits were discussed with surgical intervention including  infection, DVT, component failure, need for revision surgery and the  components that would be utilized.  Consent was obtained.   PROCEDURE IN DETAIL:  The patient was brought to the operating theater.  Once adequate anesthesia and preoperative antibiotics, Ancef, were  administered, the patient was positioned in the left lateral decubitus  position with the right side up. The right lower extremity was then pre-  scrubbed and prepped and draped in a sterile fashion.  The lateral based  incision was made for a posterior approach to the hip.  The iliotibial  band and gluteal fascia were identified and were incised posteriorly.  The short external rotators were then taken down and separated from the  posterior capsule.  An L-capsulotomy was made preserving the capsule of  the posterior leaflet for later anatomic repair as well as protection of  the sciatic nerve.  The hip was dislocated and neck osteotomy made in  the troch fossa.  It was Immediately evident that significant pathology  was noted, the femoral head with advanced collapse with a big defect  underneath.   Attention was first directed to the femur.  The patient's bone was noted  to be fairly osteoporotic.  I began with standard opening of the canal  with a starting drill, initiating with a hand reamer, irrigating to  prevent fat emboli.  I then began broaching with a size 1 setting my  anteversion at 20-25 degrees and then broached all the way up to a size  6.  I used a calcar planer and finished off the cut.  At this point, I  packed off  the femur and attended to the acetabulum.  Acetabular  exposure was obtained in routine fashion.  Labrectomy was carried out.  Following this exposure, I began reaming with a 43 reamer and was able  to go all the way up to a 49 reamer with good bony bed preparation.  A  size 50 pinnacle cup was impacted at 35-40 degrees of abduction and 20  degrees of flexion, anatomically positioned in relationship to the  ischium and the anterior wall.  I used a single cancellous screw to help  support the fixation.  A trial 36 neutral liner was placed and attention  was directed to a trial reduction.  At this point, I used a standard  neck based on her radiographs.  I felt that the standard +5 gave me more  stability.  I removed all trial components, placed a central hole  eliminator, impacted a 36 metal liner into the pinnacle cup without  difficulty.  The final 6 standard stem was  impacted.  It sat at or just  a little bit below the level of the area of the neck cut.  I re-trialed  and was again happy with the stability and lengthening of the +5 head.  The 36 plus 5 ball was impacted onto a clean dry trunnion and the hip  reduced.  The hip was irrigated throughout the case and again at this  point. I reapproximated the posterior capsule to the superior leaflet  using #1 Ethibond.  A medium Hemovac drain was placed deep.  The  remainder of the wound was closed in layers with #1 Ethibond on the  iliotibial band, #1 Vicryl in the gluteal fascia, 2-0 Vicryl in the  subcu layer, and a running 4-0 Monocryl.  The hip was then cleaned,  dried, and dressed sterilely with Steri-Strips and a Mepilex dressing.  She was brought to the recovery room extubated in stable condition.      Madlyn Frankel Charlann Boxer, M.D.  Electronically Signed     MDO/MEDQ  D:  05/18/2007  T:  05/18/2007  Job:  [id number]

## 2010-08-06 NOTE — Assessment & Plan Note (Signed)
[facility]                          [facility] OFFICE NOTE   [name], [name]                   MRN:          [id number]  DATE:07/12/2007                            DOB:          11-Oct-1935    HISTORY OF PRESENT ILLNESS:  The patient is a 75 year old female with  history of recent hip surgery.  The patient was seen by cardiology due  to an episode of rapid atrial fibrillation.  She is also status post  permanent pacemaker implantation with an AV sequential device  (Medtronic).  The patient has been doing well.  She reports no recurrent  palpitations or syncope.  She does note that her heart rate has been  fast at times when she applies her pulse oximeter.  The patient has COPD  and is on chronic O2.   MEDICATIONS:  1. Evista 60 mg p.o. daily.  2. Coumadin as directed.  3. Advair.  4. Spiriva inhaler.  5. Albuterol with verapamil 240 mg p.o. daily.  6. Atenolol 25 mg p.o. b.i.d.  7. Centrum.  8. B12.  9. Caltrate b.i.d.   PHYSICAL EXAMINATION:  VITAL SIGNS:  Blood pressure 109/62, heart rate  63, weight 121 pounds.  GENERAL:  Well-nourished white female mildly dyspneic.  HEENT:  Pupils isocoric, conjunctivae clear.  NECK:  Supple, normal carotid upstroke, no carotid bruits.  LUNGS:  Clear breath sounds bilaterally.  HEART:  Regular rate and rhythm with normal S1, S2 and no abnormal  murmurs.  ABDOMEN:  Soft, nontender, no rebound, no guarding.  Good bowel sounds.  EXTREMITIES:  No cyanosis, clubbing or edema.  NEUROLOGICAL:  Patient alert and grossly nonfocal.   PROBLEM LIST:  1. Chronic obstructive pulmonary disease on oxygen.  2. Paroxysmal atrial fibrillation with poor rate control.  3. Status post hip surgery.  4. Status post permanent pacemaker implantation.  5. Normal LV function nonischemic Cardiolite.   PLAN:  1. The patient's atenolol will be increased to 50 mg p.o. b.i.d. given      her recent episode of rapid  atrial fibrillation.  2. The patient will receive follow-up for a Medtronic device with      electrophysiology.  3. I have made no adjustments, otherwise, on the patient's medication.      She can follow up with [place] in six months.     Learta Codding, MD,FACC  Electronically Signed    GED/MedQ  DD: 07/12/2007  DT: 07/12/2007  Job #: [id number]   cc:   Doreen Beam, MD

## 2010-08-06 NOTE — Discharge Summary (Signed)
NAMEFRANCEEN, [name]            ACCOUNT NO.:  [id number]   MEDICAL RECORD NO.:  [id number]          PATIENT TYPE:  INP   LOCATION:  1613                         FACILITY:  [facility]   PHYSICIAN:  Madlyn Frankel. Charlann Boxer, M.D.  DATE OF BIRTH:  Jan 02, 1936   DATE OF ADMISSION:  05/18/2007  DATE OF DISCHARGE:  05/23/2007                               DISCHARGE SUMMARY   ADMISSION DIAGNOSES:  1. Avascular necrosis, right hip.  2. Sick sinus syndrome.  3. Paroxysmal atrial fibrillation.  4. Chronic obstructive pulmonary disease.  5. Degenerative disk disease.   CONSULTS:  Dr. [place] Bing, Cardiology, due to exacerbation of  atrial fibrillation.   LABORATORY DATA:  Preadmission CBC:  Hemoglobin and hematocrit 12.9 and  38 and platelets 234,000, tracked through her course of stay and at  discharge, she was stable with hemoglobin of 9.1, hematocrit 26.8, and  platelets 249,000.  White cell differential normal.  Coagulations:  She  was started on Coumadin at time of discharge she was therapeutic at 2.5.  Routine chemistry on admission: Sodium 145, potassium 5.3, glucose 112,  tracked throughout her course of stay and at discharge, sodium was 139,  potassium 3.8, glucose 91, creatinine 0.44.  Her kidney function was  greater than 60 GFR and calcium was 7.9.  Cardiac markers were negative  for myocardial injury.  Her BNP was 367.  Her TSH was 0.952.  UA was  negative.   CARDIOLOGY:  Multiple EKGs; on the 19th, normal sinus rhythm; the 25th  showed atrial fibrillation with rapid ventricular response.; a recheck  on March 1 showed normal sinus rhythm.   RADIOLOGY:  Chest, 2-view, done on 19th showed COPD, pacer wires and  good position, no acute cardiopulmonary findings.   Portable pelvis showed satisfactory appearance of right total hip  replacement.   CT of chest showed no evidence for acute pulmonary embolus, bilateral  pleural effusions and emphysematous changes and rounded mass within  the  upper inner quadrant of the breasts warrants correlation with mammogram.   HISTORY OF PRESENT ILLNESS:  [age]-year-old female with a history  of right hip pain secondary to avascular necrosis with significant  history of sick sinus syndrome.  Her right hip was refractory to all  conservative treatment.  She was presurgically assessed by Dr. Doreen Beam.   HOSPITAL COURSE:  The patient was admitted to the hospital for right  total hip replacement and underwent surgery and admitted to the  orthopedic floor.  We saw her on the 25th and she just had some nausea  the night before.  She was placed at just partial weightbearing of 50%  due to her osteopenia.  Coumadin was started with Lovenox bridging.  The  dressing was dry.  Hemovac was discontinued.  Cardiac consult was called  in due to some chest pain.  They followed along with her throughout her  course of stay, helping with her atrial fibrillation; this was well  managed by them orthopedically.  She progressed moderately during course  of stay.  Dressing was changed on a daily basis, no significant ooze.  She remained neurovascular intact  to this right lower extremity.  We did  maintain her partial weightbearing 50% due to the osteopenia.  Coumadin  was therapeutic by the time of discharge.  Cardiology managed her atrial  fibrillation.  Prior to discharge, she was cleared with a normal sinus  rhythm.  All medication adjustments made per Cardiology.   DISCHARGE DISPOSITION:  Stable in improved condition.  Discharged home  with home health care and PT.   DISCHARGE DIET:  Heart healthy.   DISCHARGE WOUND CARE:  Keep dry.   DISCHARGE PHYSICAL THERAPY:  Partial weightbearing of 50% with the use  of a rolling walker.   DISCHARGE MEDICATIONS:  1. Coumadin per schedule.  2. Robaxin 500 mg p.o. q.6 h.  3. Vicodin 5/325 one to two p.o. q.4-6 h. p.r.n.  4. Iron 325 mg one p.o. t.i.d. for 2 weeks.  5. Colace 100 mg p.o. b.i.d.   6. MiraLax 17 g p.o. daily.  7. Spiriva 1 puff nightly.  8. Advair 2 puffs q.a.m. and q.p.m.  9. Albuterol p.r.n.  10.Evista 60 mg daily.  11.Verapamil 360 mg p.o. daily.  12.Atenolol 25 mg p.o. b.i.d.  13.Centrum Silver daily.  14.Calcium daily.  15.B12 daily.  16.Potassium daily.  17.Mucinex daily p.r.n.   DISCHARGE FOLLOWUP:  1. Follow up with Dr. Charlann Boxer, [phone], in 2 weeks.  2. Follow up with Cardiology, Dr. Diona Browner with [facility].     ______________________________  [name]. [name], [place]      Madlyn Frankel. Charlann Boxer, M.D.  Electronically Signed    BLM/MEDQ  D:  07/08/2007  T:  07/08/2007  Job:  [id number]   cc:   Jonelle Sidle, MD  [name]  [place], [place] [address]   Doreen Beam, MD  Fax: [phone]   Gerrit Friends. Dietrich Pates, MD, [facility]  [address]  [place], [place] [address]

## 2010-08-06 NOTE — Assessment & Plan Note (Signed)
[facility]                       [place] CARDIOLOGY OFFICE NOTE   GAYLYN, BERISH                   MRN:          [id number]  DATE:08/25/2008                            DOB:          [birth year]    CARDIOLOGIST:  Jonelle Sidle, MD   PRIMARY CARE PHYSICIAN:  Doreen Beam, MD   REASON FOR VISIT:  Six-month followup.   HISTORY OF PRESENT ILLNESS:  Ms. Stroh is a 75 year old female patient  with a history of paroxysmal atrial fibrillation with tachy-brady  syndrome status post pacemaker implantation.  She was last seen in the  office in November 2009.  At that time, she had some intermittent foot  and ankle edema and was prescribed Lasix p.r.n.  She also had an  echocardiogram that demonstrated normal LV systolic function, mild LVH,  and normal RV size and function.  In the office today, she notes that  she is suffering upper respiratory tract infection type symptoms.  She  is to see Dr. Sherril Croon later this afternoon.  She has significant COPD.  She  is on several inhalers.  She notes continued dyspnea with exertion,  which seems to be chronic without significant change.  She denies  orthopnea, PND, or pedal edema.  She denies chest discomfort.  She does  note elevations in her heart rate when she gets short of breath.  She  has measured this with a pulse oximeter.  She notes that her oxygen  levels are 91-92%, which is about stable for her.   CURRENT MEDICATIONS:  1. Evista 60 mg daily.  2. Coumadin as directed - followed by Dr. Sherril Croon.  3. Advair 2 puffs b.i.d.  4. Spiriva inhaler daily.  5. Albuterol neb/Atrovent neb q.4-6 h. p.r.n.  6. Verapamil 240 mg daily.  7. Atenolol 25 mg b.i.d.  8. B12 daily.  9. Caltrate daily.  10.Magnesium oxide 400 mg daily.  11.Potassium at bedtime.  12.Prilosec 20 mg daily p.r.n.  13.Xanax 0.25 mg daily.  14.Albuterol p.r.n.   ALLERGIES:  SULFA.   SOCIAL HISTORY:  She quit smoking some years  ago.   PHYSICAL EXAMINATION:  GENERAL:  She is a well-nourished, well-developed  female, in no acute distress.  VITAL SIGNS:  Blood pressure is 130/60, pulse 70, and weight 135 pounds.  HEENT:  Normal.  NECK:  Without JVD.  CARDIAC:  Normal S1 and S2.  Regular rate and rhythm.  LUNGS:  Clear to auscultation bilaterally.  ABDOMEN:  Soft, nontender.  EXTREMITIES:  No edema.  NEUROLOGIC:  She is alert and oriented x3.  Cranial II through XII  grossly intact.  SKIN:  Warm and dry.   Electrocardiogram reveals sinus rhythm with a heart rate of 65, normal  axis, no acute changes.   ASSESSMENT AND PLAN:  1. Paroxysmal atrial fibrillation with tachy-brady syndrome status      post pacemaker implantation.  Her pacemaker was last interrogated      by Dr. [name] on March 01, 2008.  At that time, mode switched      about 6% of the time.  She is really not noticed any significant  change in her shortness breath with exertion.  The only thing that      she has noticed is elevated heart rates when she measures her      oxygen level with her pulse oximeter.  She has intermittent tachy      palpitations.  This is a fairly chronic symptom for her.  I suspect      that she likely needs an adjustment in her rate controlling      medications.  Her heart rate is 65 on electrocardiogram today,      however.  I will bring her back early next week for an      interrogation of her pacemaker.  We will specifically try to assess      her heart rate variability and mode switching for atrial      fibrillation.  If she is having a significant amount of this, we      will adjust her medical therapy.  I would try to avoid adjusting      her beta-blocker secondary to her significant COPD.  We can likely      increase her verapamil to 300 mg a day.  She may ultimately need      360 mg of verapamil a day.  2. Chronic obstructive pulmonary disease.  I think a lot of her      shortness of breath is likely  related to her COPD.  This overall      seems to be stable.  I suspect she is having a COPD exacerbation at      this time and she is to see Dr. Sherril Croon later this afternoon.   DISPOSITION:  The patient will be brought back in followup with Dr.  Diona Browner in 1 month or sooner p.r.n.   ADDENDUM:  The patient's pacer was interrogated on 6/7 and she is having  episodes with heart rates increasing to the 120 range and she is mode  switching for AFib.  She was asked to decrease her atenolol secondary to  potential for increased likelihood of bronchospasm and her  verapamil was increased to 240mg  in the AM and 120mg  in the pm.  She  will f/u with a BP check with the RN in 1 week.      Tereso Newcomer, PA-C  Electronically Signed      Jonelle Sidle, MD  Electronically Signed   SW/MedQ  DD: 08/25/2008  DT: 08/26/2008  Job #: [id number]   cc:   Doreen Beam, MD

## 2010-08-06 NOTE — H&P (Signed)
[name], [name]            ACCOUNT NO.:  [id number]   MEDICAL RECORD NO.:  [id number]          PATIENT TYPE:  INP   LOCATION:  NA                           FACILITY:  [facility]   PHYSICIAN:  Madlyn Frankel. Charlann Boxer, M.D.  DATE OF BIRTH:  10-25-1935   DATE OF ADMISSION:  05/18/2007  DATE OF DISCHARGE:                              HISTORY & PHYSICAL   PROCEDURES:  Right total hip arthroplasty.   CHIEF COMPLAINTS:  Right hip pain.   HISTORY OF PRESENT ILLNESS:  A 75 year old female with a history of  right hip pain secondary to avascular necrosis.  Been refractory to all  conservative treatment.  Presurgically assessed by Dr. Doreen Beam.   PAST MEDICAL HISTORY:  1. Avascular necrosis.  2. Degenerative disk disease.  3. COPD.   PAST SURGICAL HISTORY:  1. Hysterectomy.  2. Hernia repair.  3. Varicose veins.  4. Appendectomy.  5. Carpal tunnel release.  6. Arthroscopic surgery of her knee in 1986.   FAMILY HISTORY:  Cancer, stroke, arthritis.   SOCIAL HISTORY:  The patient is widowed.  Primary caregiver will be her  daughter after surgery.   DRUG ALLERGIES:  SULFA DRUGS.   MEDICATIONS:  1. Coumadin 5 mg p.o. daily times 6 days and 7.5 mg on Monday.  2. Verapamil 240 mg one p.o. daily.  3. Atenolol 25 mg one p.o. b.i.d.  4. Evista 60 mg one daily.  5. Advair 2 puffs q.a.m. and q.h.s. p.r.n.  6. Spiriva one p.o. q.h.s.  7. Albuterol p.r.n.  8. Multivitamin.  9. B12.  10.Calcium.  11.Potassium.   REVIEW OF SYSTEMS:  PULMONARY:  She does have shortness of breath  issues.  CARDIOVASCULAR:  States an aneurysm and shortness of breath  during exertion.  GASTROINTESTINAL: History of diverticulitis.  GENITOURINARY: Has a weak stream.  Otherwise see HPI.   PHYSICAL EXAMINATION:  VITAL SIGNS:  Paced rate of 60, respirations 18,  blood pressure 126/76.  GENERAL:  Awake, alert and oriented, well-developed, well-nourished.  NECK:  Supple.  No carotid bruits.  CHEST/LUNGS:  Clear to  auscultation bilaterally.  BREASTS:  Her deferred.  HEART:  Regular rate and rhythm.  S1-S2 distinct.  ABDOMEN:  Soft, nontender, nondistended.  Bowel sounds present.  GENITOURINARY:  Deferred.  EXTREMITIES:  Right hip has increased pain with internal range of  motion.  SKIN:  No cellulitis.  NEUROLOGIC:  Intact distal sensibilities.   LABORATORY DATA:  Labs, EKG, chest x-ray all pending.   IMPRESSION:  Right hip avascular necrosis.   PLAN OF ACTION:  Right total hip replacement at [facility]  May 18, 2007, by surgeon Dr. Durene Romans.  Risks and complications  were discussed.   POSTOPERATIVE MEDICATIONS:  Robaxin, iron, Colace, MiraLax.  The patient  does use Coumadin.  Will resume treatment afterwards with Lovenox  bridging until therapeutic.     ______________________________  [name]. [name], [place]      Madlyn Frankel. Charlann Boxer, M.D.  Electronically Signed    BLM/MEDQ  D:  05/17/2007  T:  05/18/2007  Job:  [id number]   cc:   Doreen Beam, MD  Fax: [phone]

## 2010-08-09 NOTE — Op Note (Signed)
NAMEKINLEIGH, NAULT                       ACCOUNT NO.:  [id number]   MEDICAL RECORD NO.:  [id number]                   PATIENT TYPE:  INP   LOCATION:  4727                                 FACILITY:  [facility]   PHYSICIAN:  Doylene Canning. [name], M.D. [facility]           DATE OF BIRTH:  1935-07-24   DATE OF PROCEDURE:  06/20/2002  DATE OF DISCHARGE:                                 OPERATIVE REPORT   PROCEDURE PERFORMED:  Implantation of a dual chamber pacemaker.   INDICATIONS FOR PROCEDURE:  Symptomatic tachybradycardia syndrome with  paroxysmal atrial fibrillation.   INTRODUCTION:  The patient is a very pleasant 75 year old woman with severe  COPD who has a history of paroxysmal atrial arrhythmias and documented heart  rates in the 30s as well as in the 150 beats per minute range.  The patient  has had dizzy spells secondary to the above.  She is admitted for additional  evaluation and treatment.   DESCRIPTION OF PROCEDURE:  After informed consent was obtained, the patient  was taken to the diagnostic laboratory in a fasting state.  After the usual  preparation and draping, the total of 30 cc of lidocaine was infiltrated  into the left infraclavicular region.  A 5 cm incision was carried out over  this region and electrocautery utilized to dissect down to the subpectoralis  fascia.  A 10 cc of contrast injected in the left upper extremity venous  system demonstrated a patent left subclavian vein.  The vein was punctured  x2 and the Medtronics model 5076 52 cm active fixation pacing lead, serial  No. [id number] ws advanced into the right ventricle and the Medtronics model  5076, 45 cm active fixation pacing lead, serial No. [id number] was advanced  into the right atrium.  Mapping was carried out of the right ventricle where  R waves measured 10-12 millivolts.  The patient's threshold after the lead  was actively fixed was 0.7 volts at 0.5 milliseconds with an initial pacing  Impedence of  1100 ohms.  Subsequent pacing Impedances were down in the 900  range.  The 10 volt pacing did not result in diaphragmatic stimulation.  With the ventricular lead in satisfactory position, attention was turned to  the atrial lead.  It was placed in the right atrial appendage and actively  fixed.  T waves measured 5 millivolts and the initial pacing threshold once  the patient was back in sinus rhythm was 1.8 volts at 0.5 milliseconds.  Subsequent pacing threshold was 0.75 volts at 0.5 milliseconds.  Once the  lead was actively fixed, the patient's Impedence was 640 ohms.  Again, 10  volt pacing in the atrium did not result in diaphragmatic stimulation.  With  both atrial and ventricular leads in satisfactory position and secured,  these leads were secured to the subpectoralis fascia with a figure-of-eight  silk suture.  In addition, the subcutaneous tissue was secured with silk  suture.  Electrocautery was then utilized to make a subcutaneous pocket.  Kanamycin irrigation was utilized to irrigate the incision and the incision  was then closed with a layer of 2-0 Vicryl followed by a layer of 3-0 Vicryl  followed by a layer of 4-0 Vicryl.  Benzoin was painted on the skin.  Steri-  Strips were applied and a pressure dressing placed and the patient returned  to her room in satisfactory condition.   COMPLICATIONS:  There were no immediate procedure complications.    RESULTS:  This demonstrates successful implantation of a Medtronics dual  chamber pacemaker in a patient with symptomatic tachybradycardia syndrome.                                                Doylene Canning. [name], M.D. [facility]    GWT/MEDQ  D:  06/20/2002  T:  06/20/2002  Job:  [id number]   cc:   Doreen Beam  [address]  [place]  [place] [address]  Fax: [phone]   [facility]  Otilio Carpen, M.D.   Kathrine Cords, M.D.  [facility]

## 2010-08-09 NOTE — Consult Note (Signed)
Kelly, [name]                       ACCOUNT NO.:  [id number]   MEDICAL RECORD NO.:  [id number]                   PATIENT TYPE:  INP   LOCATION:  4727                                 FACILITY:  [facility]   PHYSICIAN:  Casimiro Needle B. Sherene Sires, M.D. [facility]           DATE OF BIRTH:  24-Dec-1935   DATE OF CONSULTATION:  DATE OF DISCHARGE:                                   CONSULTATION   CHIEF COMPLAINT:  COPD evaluation.   HISTORY OF PRESENT ILLNESS:  This is a 75 year old white female with a long  smoking history, with quite variable history depending on who asks her what  question.  Apparently she has informed doctors in [place] that she has stopped  smoking but, in fact, she says her last cigarette was on the day of  admission, which was 06/13/02.  She is supposed to be taking oxygen at  bedtime and states she does wear it during the day but smokes during the  night and has had a gradual decline in exercise tolerance over the last  several years to the point where she just goes slow all the time.  Just  prior to admission she took to bed and admitted with new-onset hypercarbic  respiratory failure to [facility] and is transferred here now for  evaluation of atrial fibrillation and possible clot in the IVC.   The patient has had a history of hemoptysis remotely but denies any recent  hemoptysis.  She does complain of increasing cough and congestion with  thick white mucus and also had a classic case of watery reflux several  times over the last week where she expectorated large volumes of hot  water from her mouth.  She denies, however, having any pleuritic pain, any  increase in dyspnea over baseline for the last several weeks except when I  miss my nebulizer, or any leg swelling, fevers, chills, or sweats.   On admission to [facility] she was felt to have a hyperinflated chest  with no focal infiltrates and was placed on Claforan and Zithromax by Dr.  Sonny Masters and taught how  to use a flutter valve.  She was also noted to have  possible hyperthyroidism with a TSH of 0.9 noted, with an echocardiogram  that suggested a clot in the IVC at the junction of the right atrium, and  therefore has been anticoagulated.  So far on anticoagulation she has not  coughed up any blood.  She states she has been more alert since she stopped  smoking and wore oxygen 24 hours a day.  She denies any headache either now  or in the past in the morning nor any recent classic COPD exacerbation or  previous dramatic response to steroids or nebulizers, every day is about  the same regardless of what she takes.   PAST MEDICAL HISTORY:  Significant for COPD, osteoporosis, degenerative  arthritis, cardiac arrhythmias controlled on Cardizem, and atrial septal  aneurysm.  She is status post hysterectomy, vein stripping, benign breast  biopsies, and appendectomy.   ALLERGIES:  SULFA causes rash.   MEDICATIONS:  Previously she was on theophylline, which has now been  discontinued.  She continues to take Advair 50/250 b.i.d., and Xopenex,  along with Atrovent q.i.d.   FAMILY HISTORY:  Significant for cerebrovascular disease in her mother, who  died of cancer, and negative for any form of respiratory disease in her  mother, father, or brother.   SOCIAL HISTORY:  She quit smoking, as noted above, on admission to the  hospital.  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   REVIEW OF SYSTEMS:  Taken in detail and essentially negative except as  already outlined above.   PHYSICAL EXAMINATION:  GENERAL:  This is a chronically ill white female able  to lie back at 30 degrees with mild increased work of breathing, using her  accessory muscles at rest, however.  VITAL SIGNS:  She is afebrile, normal vital signs, with saturations running  in the low 90s on 2 L.  HEENT:  Oropharynx is clear.  NECK:  Supple without cervical adenopathy.  Neck accessory muscles are used  at rest.  CHEST:   Barrel-shaped, hyperresonant to percussion, with markedly diminished  breath sounds, only trace wheezing, marked increased expiratory time.  CARDIAC:  A slightly irregular rhythm with a 1/6 systolic ejection murmur.  ABDOMEN:  Soft, benign, with no palpable organomegaly, masses, or  tenderness.  EXTREMITIES:  Warm without calf tenderness, cyanosis, clubbing, or edema.  NEUROLOGIC:  No focal deficits.  SKIN:  Warm and dry.   LABORATORY DATA:  Bicarb level was noted to be 34 at the time of admission.   IMPRESSION:  1. This patient clearly has advanced emphysematous chronic obstructive     pulmonary disease and most likely was hypercarbic over the last several     days prior to admission, manifested by hypersomnolence.  Note that a     bicarb level of 34 corresponds to a PCO2 in the mid-50s and is a     surrogate for chronic respiratory failure.  In this setting it is     absolutely critical that the patient understands the issue of committing     smoking cessation or considering end of life issues.  The last thing she     told me before I left the room was, Don't let Dr. Sherril Croon know I still     smoke, indicating that she has not been particularly forthright with her     doctors regarding her smoking, and I doubt she has been particularly     compliant with her medications as well.  In fact, the more I talked to     her, the more I realized how much in denial she is in terms of     objectively reporting her symptoms and complaints, tending to minimize     them to a large extent.  2. Atrial arrhythmias.  Therefore, all bronchodilators should probably be     avoided except for Xopenex.  If she develops more arrhythmias on Advair     and off of theophylline, I would stop the Advair as well and simply use     Xopenex 0.625 mg q.6h. (leaving off the Atrovent as well).  I would add     mucolytics in the form of guaifenesin LA two b.i.d., acknowledging this    is not the greatest drug in the world  but it  certainly is better than     nothing in terms of helping with mucociliary function, and I strongly     support the use of the flutter valve as well.  3. Finally, we have the issue of a history of hemoptysis in a patient with     possible thromboembolic disease and a possible clot in the inferior vena     cava.  I certainly agree with anticoagulation, but I would repeat a CT     scan because of the issue of previous lung mass or source for hemoptysis.     Although she denied having it presently, she is certainly at risk of     recurrent hemoptysis now on anticoagulation.  At the same time I strongly     doubt that she has an acute pulmonary embolism without any chest pain or     reported increase of dyspnea over baseline.  In fact, hypercarbia would     be a very unusual presentation for acute pulmonary embolism.  4. This patient appears to have a component of reflux as well with gross     reflux, hot water, over the last several days.  This could certainly     exacerbate chronic obstructive pulmonary disease, and I will add PPI     therapy as well.  5. I do not feel that she needs to be on prolonged high-dose corticosteroids     and would rapidly taper these off.   I would be happy to see this patient back at any time in [place], but she  has an excellent pulmonologist locally in Dr. Sonny Masters.                                               Charlaine Dalton. Sherene Sires, M.D. [facility]    MBW/MEDQ  D:  06/17/2002  T:  06/20/2002  Job:  [id number]   cc:   Doreen Beam  [address]  [place]  [place] [address]  Fax: [phone]   Sonny Masters, M.D.

## 2010-08-09 NOTE — Assessment & Plan Note (Signed)
[facility]                           ELECTROPHYSIOLOGY OFFICE NOTE   NAME:[name], [name]                    MRN:          [id number]  DATE:12/11/2005                            DOB:          02-18-1936    Ms. [name] is seen in device clinic today in the [place] office, December 11, 2005, for follow up of her Medtronic Kappa 921 dual chamber pacemaker  implanted in March 2004 for tachy-brady syndrome.  Of note, she has not been  seen since her three month postop visit.   Upon interrogation, battery voltage is 2.75 volts.  She is in atrial  fibrillation approximately 17.9% of the total time.  In the atrium intrinsic  amplitude is 0.5 to 0.7 millivolts with an impedance of 638 ohms and, in the  right ventricle, intrinsic amplitude is 11.2 millivolts with an impedance of  711 ohms and threshold of 0.5 volts at 0.4 milliseconds.  Luckily, adaptic  capture had been previously turned on in the ventricle.   No changes were made at this time.  She will be seen in six months time for  follow up in the [place] office and then, hopefully, start yearly  transmissions.                                   Kelly Burke, RN                                Kelly Salvia, MD, [facility]   CF/MedQ  DD:  12/11/2005  DT:  12/13/2005  Job #:  [id number]

## 2010-08-09 NOTE — Consult Note (Signed)
[name], [name]                       ACCOUNT NO.:  [id number]   MEDICAL RECORD NO.:  [id number]                   PATIENT TYPE:  INP   LOCATION:  4727                                 FACILITY:  [facility]   PHYSICIAN:  Doylene Canning. [name], M.D. [facility]           DATE OF BIRTH:  1936-03-18   DATE OF CONSULTATION:  06/18/2002  DATE OF DISCHARGE:                                   CONSULTATION   REQUESTING PHYSICIAN:  Dr. Pricilla Riffle.   REASON FOR CONSULTATION:  Consultation is requested by Dr. Pricilla Riffle for  evaluation of tachybrady syndrome.   HISTORY OF PRESENT ILLNESS:  The patient is a very pleasant 75 year old  woman with a past medical history of COPD, who was admitted to the hospital  with severe weakness and subsequently found to have atrial fibrillation with  very rapid ventricular response at rates of over 160 beats per minute.  In  addition, she, on telemetry, was found to have severe bradycardia with sinus  rates in the 30s.  These symptoms were associated with dizziness.  She was  transferred here for additional evaluation.  The patient denies frank chest  pain.  She denies peripheral edema.  The patient does have baseline COPD,  although her COPD seems to have worsened in the last several weeks.  She has  chronic productive sputum and unfortunately, ongoing tobacco abuse.   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   PAST SURGICAL HISTORY:  Her past surgical history is notable for a  hysterectomy, hernia repair, status post appendectomy, a history of varicose  vein stripping, history of carpal tunnel syndrome.   MEDICATIONS:  Medications include Decadron, Humibid, Evista, Advair,  Cardizem CD, Lopressor, Zithromax and Atrovent/Xopenex.   SOCIAL HISTORY:  The patient lives in [place].  She has smoked cigarettes for  over 30 years.  She has a history of alcohol use, quitting in 1994.  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   FAMILY HISTORY:  The family history is notable for her mother dying of  cancer and her father dying of a stroke.   REVIEW OF SYSTEMS:  Review of systems is notable for no fevers, chills or  night sweats.  She denies headache or vision or hearing problems.  She  denies any skin rashes or lesions.  She denies chest pain but does have  chronic shortness of breath and dyspnea.  She has a productive cough.  She  has intermittent wheezing.  She does have minimal evidence of palpitations,  although her palpitations do not correlate well with her tachycardia.  She  also has a history of arthritis and arthralgias.  She denies dysuria,  hematuria or nocturia.  She denies any weakness, numbness, depression or  anxiety.  She denies nausea, vomiting, diarrhea or constipation.  She denies  polyuria or polydipsia.  The rest of the review of systems was unremarkable.   PHYSICAL EXAMINATION:  GENERAL:  On physical exam, she is a pleasant but  somewhat frail middle-aged, chronically ill-appearing woman in no distress.  VITAL SIGNS: Blood pressure was 120/80.  The pulse was 50 and irregular.  Temperature was 97.2.  HEENT:  Normocephalic and atraumatic.  The pupils were equal and round.  The  oropharynx was moist.  The sclerae were anicteric.  NECK:  The neck revealed no obvious jugular venous distention.  The thyroid  was not enlarged.  The carotids were 2+ and symmetric.  I did not appreciate  any bruits.  CARDIOVASCULAR:  Exam revealed an irregular bradycardia.  There were no  murmurs, rubs, or gallops.  The heart sounds were somewhat distant.  LUNGS:  The lungs revealed coarse breath sounds bilaterally with scattered  wheezes.  ABDOMEN:  Soft, nontender and nondistended.  There was no organomegaly.  EXTREMITIES:  Extremities demonstrated no cyanosis, clubbing or  edema.  NEUROLOGIC:  She was alert and oriented x3 with cranial nerves II-XII  grossly intact.  The strength was 4+/5 and symmetric.   LABORATORY AND ACCESSORY CLINICAL DATA:  Her EKGs have ranged from atrial  fibrillation with rapid ventricular response at rates of up to 150 beats per  minute.  In addition, she has documented sinus bradycardia with rates in the  30s.   IMPRESSION:  1. Paroxysmal atrial fibrillation with rapid ventricular rates.  2. Sinus bradycardia (tachybrady syndrome) with heart rates in the 30s.  3. Chronic obstructive pulmonary disease with CO2 retention.  4. Hyperthyroidism.  5. Degenerative joint disease.   RECOMMENDATION:  Recommendation at this time would be to continue the  patient on Cardizem and low-dose metoprolol.  This may be exacerbating her  COPD though and I would ultimately like to consider switching to verapamil.  In addition, I have recommended that the patient proceed with permanent  pacemaker insertion to control her bradycardic episodes.  She will need long-  term Coumadin therapy as well.                                               Doylene Canning. [name], M.D. [facility]    GWT/MEDQ  D:  06/18/2002  T:  06/20/2002  Job:  [id number]   cc:   Jonelle Sidle, M.D. [facility], [place] Doreen Beam M.D.   Kathrine Cords, R.N. [facility]

## 2010-08-09 NOTE — Discharge Summary (Signed)
[name], BEVEL                       ACCOUNT NO.:  [id number]   MEDICAL RECORD NO.:  [id number]                   PATIENT TYPE:  INP   LOCATION:  4727                                 FACILITY:  [facility]   PHYSICIAN:  Doylene Canning. [name], M.D. [facility]           DATE OF BIRTH:  10/16/1935   DATE OF ADMISSION:  06/16/2002  DATE OF DISCHARGE:  06/21/2002                                 DISCHARGE SUMMARY   PRIMARY DIAGNOSIS:  Tachybrady syndrome.   HISTORY OF PRESENT ILLNESS:  This is a 75 year old female with past medical  history of COPD.  She was admitted to the hospital with severe weakness and  subsequently found to have atrial fibrillation with very rapid rate of 160.  In addition, on telemetry, she was found to have severe bradycardia with  rates in the 30s.  These symptoms were associated with dizziness.  She was  transported to [facility] for further evaluation.  She denies any frank  chest pain, peripheral edema.  She does have baseline COPD, although her  COPD seems to have worsened in the last several weeks, chronic productive  sputum, unfortunately ongoing tobacco abuse.   PAST MEDICAL HISTORY:  Past medical history is notable for hypothyroidism,  history of osteoporosis, DJD, history of inferoseptal aneurysm by 2-D echo.   HOSPITAL COURSE:  The patient was admitted for evaluation.  She underwent  consult with EP service for placement of a permanent pacemaker.  Also during  hospitalization, the patient had a consult with Dr. Casimiro Needle B. Wert for  management of her COPD.  At that point, it was recommended that the patient  be placed on Advair and Xopenex and that she would not need prolonged doses  of cortisone and that the patient should rapidly taper off the steroids.  The patient underwent a 2-D echo which showed normal LV, RV, RA and LA  mildly dilated.  IVC was normal, no mass.  It was felt that this was an  artifact on the previous study.  IVC was normal.  A CAT scan of  the abdomen  and chest was also performed to rule this out.  The patient was started on  Coumadin therapy for her atrial fibrillation.  She went to the OR for a  Medtronic dual-chamber pacemaker on June 20, 2002; she tolerated the  procedure well and had no immediate postop complications.  CAT scan of the  chest shows no evidence of pulmonary emboli, severe COPD, small bilateral  effusions and bibasilar atelectasis.  CAT scan of the abdomen shows  retroaortic left renal vein, no evidence of renal vein thrombosis or IVC  abnormality, enlarged portal vein suggesting portal venous hypertension,  right renal cyst.  CT of pelvis shows tiny amount of free fluid in the  pelvis.  The patient had an uneventful postop course.  She was started on  verapamil for control of her ventricular rates and she was discharged to  home on June 21, 2002 in stable condition.   DISCHARGE MEDICATIONS:  She was discharged on:  1. Evista 60 mg daily.  2. Advair one puff b.i.d.  3. Atrovent and Xopenex mini-nebulizers.  4. Humibid 1200 mg b.i.d.  5. Coumadin 2.5 mg nightly.  6. Calan SR 180 mg daily.  7. Toprol 50 mg daily.  8. She was on prednisone taper -- 5 mg -- two tablets daily for five days,     one tablet daily for five days, then one-half tablet daily for five days,     then discontinue.  9. Tylenol one to two tablets every four to six hours as needed for pain.   ACTIVITY:  Per pacemaker discharge sheet.   WOUND CARE:  Per pacemaker discharge sheet.   DIET:  Low-fat, low-cholesterol diet.   FOLLOWUP:  The patient was to have her PT[facility]INR done [facility] [place] at 9 a.m. on Thursday, June 23, 2002; pacemaker  clinic at [facility] on July 04, 2002, at 9:15 a.m.Marland Kitchen  She would see Dr. Doylene Canning. [name] in three months and the office will call to schedule that  appointment.  The patient was also to see Dr. Jonelle Sidle on Aug 04, 2002 at 12:15 p.m. and continue to  follow with Dr. Doreen Beam.     [name], C.R.N.P. [facility]                 Doylene Canning. [name], M.D. [facility]    DS/MEDQ  D:  06/21/2002  T:  06/21/2002  Job:  [id number]   cc:   Doylene Canning. [name], M.D. [facility]   Jonelle Sidle, M.D. [facility]   [place], [place] [facility]   [facility] [place], [place], [place] M.D.

## 2010-08-30 ENCOUNTER — Other Ambulatory Visit: Payer: Self-pay | Admitting: *Deleted

## 2010-08-30 MED ORDER — DIGOXIN 125 MCG PO TABS: 125.0000 ug | ORAL_TABLET | Freq: Every day | ORAL | Status: DC

## 2010-09-04 ENCOUNTER — Other Ambulatory Visit: Payer: Self-pay

## 2010-09-04 ENCOUNTER — Telehealth: Payer: Self-pay | Admitting: Internal Medicine

## 2010-09-04 MED ORDER — DIGOXIN 125 MCG PO TABS: 125.0000 ug | ORAL_TABLET | Freq: Every day | ORAL | Status: DC

## 2010-09-04 NOTE — Telephone Encounter (Signed)
Patient needs refill on Digoxin sent to [facility] / states she was told by [facility] that they have not been able to get in touch with [place] / tg

## 2010-09-20 ENCOUNTER — Ambulatory Visit: Payer: Medicare Other | Admitting: Cardiology

## 2010-09-30 ENCOUNTER — Encounter: Payer: Self-pay | Admitting: Internal Medicine

## 2010-09-30 ENCOUNTER — Ambulatory Visit (INDEPENDENT_AMBULATORY_CARE_PROVIDER_SITE_OTHER): Payer: Medicare Other | Admitting: Internal Medicine

## 2010-09-30 VITALS — BP 127/82 | HR 93 | Ht 65.0 in | Wt 112.0 lb

## 2010-09-30 DIAGNOSIS — Z950 Presence of cardiac pacemaker: Secondary | ICD-10-CM

## 2010-09-30 DIAGNOSIS — I4891 Unspecified atrial fibrillation: Secondary | ICD-10-CM

## 2010-09-30 DIAGNOSIS — I495 Sick sinus syndrome: Secondary | ICD-10-CM

## 2010-09-30 DIAGNOSIS — I10 Essential (primary) hypertension: Secondary | ICD-10-CM

## 2010-09-30 MED ORDER — METOPROLOL TARTRATE 12.5 MG HALF TABLET: 12.5000 mg | ORAL_TABLET | Freq: Two times a day (BID) | ORAL | Status: DC

## 2010-09-30 NOTE — Patient Instructions (Signed)
Your physician recommends that you schedule a follow-up appointment in: 3 month Dr. [name],  6 months Gunnar Fusi Your physician has recommended you make the following change in your medication: begin metoprolol tartrate 12.5mg  twice daily

## 2010-10-01 ENCOUNTER — Encounter: Payer: Self-pay | Admitting: Internal Medicine

## 2010-10-01 NOTE — Assessment & Plan Note (Signed)
Her blood pressure appears to be well-controlled. She will continue her current medications. 

## 2010-10-01 NOTE — Assessment & Plan Note (Signed)
Her ventricular rate does not appear to be well controlled. She does not experience palpitations. Will add low-dose beta blocker in hopes of better ventricular rate control in atrial fibrillation.

## 2010-10-01 NOTE — Progress Notes (Signed)
HPI [name] returns today for followup. She is a pleasant 75 year old woman with a history of symptomatic tachycardia bradycardia syndrome. She has initially paroxysmal and now persistent atrial fibrillation. She is status post permanent pacemaker insertion. She does not feel palpitations. She does note that when she tries to do anything she quickly gets winded and has to stop and rest. She also has a history of chronic COPD and asthma. She denies chest pain, peripheral edema, or syncope.   Allergies  Allergen Reactions  . Sulfonamide Derivatives     REACTION: rash     Current Outpatient Prescriptions  Medication Sig Dispense Refill  . albuterol (PROVENTIL HFA) 108 (90 BASE) MCG/ACT inhaler Inhale 2 puffs into the lungs every 4 (four) hours as needed.        . ALPRAZolam (XANAX) 0.25 MG tablet Take 0.25 mg by mouth 2 (two) times daily as needed.        . digoxin (LANOXIN) 0.125 MG tablet Take 1 tablet (125 mcg total) by mouth daily.  90 tablet  3  . fluticasone-salmeterol (ADVAIR HFA) 115-21 MCG/ACT inhaler Inhale 2 puffs into the lungs 2 (two) times daily.        . furosemide (LASIX) 40 MG tablet Take 40 mg by mouth daily.       . raloxifene (EVISTA) 60 MG tablet Take 60 mg by mouth daily.        Marland Kitchen tiotropium (SPIRIVA) 18 MCG inhalation capsule Place 18 mcg into inhaler and inhale daily.        . verapamil (COVERA HS) 240 MG (CO) 24 hr tablet Take 240 mg by mouth daily.        Marland Kitchen warfarin (COUMADIN) 5 MG tablet Take 5 mg by mouth. As directed by coumadin clinic        . metoprolol tartrate (LOPRESSOR) 12.5 mg TABS Take 0.5 tablets (12.5 mg total) by mouth 2 (two) times daily.  180 tablet  3  . metoprolol tartrate (LOPRESSOR) 12.5 mg TABS Take 0.5 tablets (12.5 mg total) by mouth 2 (two) times daily.  60 tablet  0     Past Medical History  Diagnosis Date  . Osteoporosis   . Diverticulosis   . Renal cyst   . Hyperthyroidism   . C. difficile colitis     12/06  . Lumbar and  sacral arthritis   . COPD (chronic obstructive pulmonary disease)     PFT 10/05/08 - FEV1 0.89(43%), FVC 1.84(62%), FEV1% 49, TLC 5.64(120%), DLCO 38%  . Hypoxemia     Nocturnal oxygen  . Atrial fibrillation     Paroxysmal  . Sick sinus syndrome     Medtronic PPM    ROS:   All systems reviewed and negative except as noted in the HPI.   Past Surgical History  Procedure Date  . Total hip arthroplasty     Right  . Insert / replace / remove pacemaker     Medtronic     Family History  Problem Relation Age of Onset  . Lung cancer Mother   . Heart disease Brother      History   Social History  . Marital Status: Widowed    Spouse Name: N/A    Number of Children: N/A  . Years of Education: N/A   Occupational History  . Retired     Therapist, occupational   Social History Main Topics  . Smoking status: Former Smoker -- 1.0 packs/day for 20 years    Types: Cigarettes  .  Smokeless tobacco: Never Used  . Alcohol Use: No  . Drug Use: No  . Sexually Active: Not on file   Other Topics Concern  . Not on file   Social History Narrative  . No narrative on file     BP 127/82  Pulse 93  Ht 5\' 5"  (1.651 m)  Wt 112 lb (50.803 kg)  BMI 18.64 kg/m2  SpO2 92%  Physical Exam:  Chronically ill appearing NAD HEENT: Unremarkable Neck:  No JVD, no thyromegally Lymphatics:  No adenopathy Back:  No CVA tenderness Lungs:  Clear except for scattered rales and reduced lung sounds throughout. No wheezes are present. HEART:  Iregular rate rhythm, no murmurs, no rubs, no clicks Abd:  soft, positive bowel sounds, no organomegally, no rebound, no guarding Ext:  2 plus pulses, no edema, no cyanosis, no clubbing Skin:  No rashes no nodules Neuro:  CN II through XII intact, motor grossly intact  DEVICE  Normal device function.  See PaceArt for details. Underlying atrial fibrillation  Assess/Plan:

## 2010-10-01 NOTE — Assessment & Plan Note (Signed)
Her device is working normally. Interrogation today demonstrates that her ventricular rates are increased somewhat. I have asked that she started low-dose beta blocker. I have warned her that if she has evidence of wheezing or worsening shortness of breath that she should call our office and discontinue her metoprolol.

## 2010-11-15 ENCOUNTER — Other Ambulatory Visit: Payer: Self-pay | Admitting: *Deleted

## 2010-11-15 ENCOUNTER — Telehealth: Payer: Self-pay | Admitting: Internal Medicine

## 2010-11-15 MED ORDER — METOPROLOL TARTRATE 25 MG PO TABS: 12.5000 mg | ORAL_TABLET | Freq: Two times a day (BID) | ORAL | Status: DC

## 2010-11-15 NOTE — Telephone Encounter (Signed)
Resent rx to [facility]

## 2010-11-15 NOTE — Telephone Encounter (Signed)
Patient states that a medicine from Dr.Taylor at her last visit was to be called to [facility] but was not.  Please call patient with questions. / tg

## 2010-12-13 LAB — CBC
HCT: 25.1 — ABNORMAL LOW
HCT: 38
Hemoglobin: 12.9
Hemoglobin: 8.5 — ABNORMAL LOW
MCHC: 33.9
MCV: 89.7
Platelets: 157
Platelets: 169
RBC: 2.81 — ABNORMAL LOW
RBC: 2.9 — ABNORMAL LOW
RDW: 14.8
RDW: 14.9
RDW: 15
WBC: 9.3

## 2010-12-13 LAB — URINALYSIS, ROUTINE W REFLEX MICROSCOPIC
Bilirubin Urine: NEGATIVE
Hgb urine dipstick: NEGATIVE
Specific Gravity, Urine: 1.021
Urobilinogen, UA: 0.2

## 2010-12-13 LAB — PROTIME-INR
INR: 1
INR: 1.1
INR: 2.2 — ABNORMAL HIGH
Prothrombin Time: 13.1
Prothrombin Time: 14.6
Prothrombin Time: 19.4 — ABNORMAL HIGH
Prothrombin Time: 21.1 — ABNORMAL HIGH

## 2010-12-13 LAB — BASIC METABOLIC PANEL
BUN: 9
CO2: 34 — ABNORMAL HIGH
Calcium: 7.9 — ABNORMAL LOW
Calcium: 8.2 — ABNORMAL LOW
Calcium: 9.6
Chloride: 106
Creatinine, Ser: 0.43
Creatinine, Ser: 0.56
GFR calc Af Amer: >60
GFR calc non Af Amer: >60
Glucose, Bld: 112 — ABNORMAL HIGH
Glucose, Bld: 172 — ABNORMAL HIGH
Sodium: 136
Sodium: 145

## 2010-12-13 LAB — TSH: TSH: 0.952

## 2010-12-13 LAB — DIFFERENTIAL
Basophils Relative: 1
Monocytes Absolute: 0.4
Monocytes Relative: 6
Neutro Abs: 4.4

## 2010-12-13 LAB — CK TOTAL AND CKMB (NOT AT ARMC): Relative Index: 1.9

## 2010-12-13 LAB — URINE MICROSCOPIC-ADD ON

## 2010-12-13 LAB — MAGNESIUM: Magnesium: 1.8

## 2010-12-13 LAB — TYPE AND SCREEN: ABO/RH(D): O POS

## 2010-12-16 LAB — PROTIME-INR
INR: 2.5 — ABNORMAL HIGH
Prothrombin Time: 28.2 — ABNORMAL HIGH

## 2010-12-16 LAB — CBC
MCV: 89.8
Platelets: 249
RBC: 2.98 — ABNORMAL LOW
WBC: 5.2

## 2010-12-16 LAB — BASIC METABOLIC PANEL
Calcium: 7.9 — ABNORMAL LOW
Chloride: 99
Creatinine, Ser: 0.44
GFR calc Af Amer: >60
GFR calc non Af Amer: >60

## 2011-01-13 ENCOUNTER — Encounter: Payer: Medicare Other | Admitting: Internal Medicine

## 2011-03-25 DIAGNOSIS — J14 Pneumonia due to Hemophilus influenzae: Secondary | ICD-10-CM

## 2011-03-25 HISTORY — PX: OTHER SURGICAL HISTORY: SHX169

## 2011-03-25 HISTORY — DX: Pneumonia due to hemophilus influenzae: J14

## 2011-04-15 DIAGNOSIS — I4891 Unspecified atrial fibrillation: Secondary | ICD-10-CM

## 2011-04-16 DIAGNOSIS — R0602 Shortness of breath: Secondary | ICD-10-CM

## 2011-04-17 ENCOUNTER — Encounter: Payer: Self-pay | Admitting: *Deleted

## 2011-04-20 DIAGNOSIS — I4891 Unspecified atrial fibrillation: Secondary | ICD-10-CM

## 2011-04-21 ENCOUNTER — Other Ambulatory Visit (HOSPITAL_COMMUNITY): Payer: Self-pay | Admitting: Internal Medicine

## 2011-04-21 DIAGNOSIS — R918 Other nonspecific abnormal finding of lung field: Secondary | ICD-10-CM

## 2011-04-23 ENCOUNTER — Telehealth: Payer: Self-pay

## 2011-04-23 ENCOUNTER — Telehealth: Payer: Self-pay | Admitting: *Deleted

## 2011-04-23 MED ORDER — METOPROLOL TARTRATE 25 MG PO TABS: 12.5000 mg | ORAL_TABLET | Freq: Three times a day (TID) | ORAL | Status: DC

## 2011-04-23 NOTE — Telephone Encounter (Signed)
[name], [facility], states that pt's RX for Metoprolol 25 mg reads take one tablet 3 times daily X 3 days then decrease to 1 tablet daily thereafter and she wants to know if this is correct. Please advise./LV

## 2011-04-23 NOTE — Telephone Encounter (Signed)
Message left on nurse's voicemail that clarification needed on metoprolol instructions. Patient was recently d/c from [facility] on 04/21/11 per message on voicemail. Records printed off and faxed to [place] office for clarification.

## 2011-04-23 NOTE — Telephone Encounter (Signed)
I do not have complete information in order to make any decision about the metoprolol dose. I was not present when she was discharged, and cannot pull a discharge medication list from [place] office. The dose of metoprolol described does not make sense. I am asking that Gene review the records and determine what appropriate medication dose she needs.

## 2011-04-23 NOTE — Telephone Encounter (Signed)
Per [name], after he spoke with [facility] with [facility], metoprolol dose has been clarified by [facility] office.

## 2011-04-28 ENCOUNTER — Ambulatory Visit (HOSPITAL_COMMUNITY)
Admission: RE | Admit: 2011-04-28 | Discharge: 2011-04-28 | Disposition: A | Discharge status: Home / Self Care | Payer: Medicare Other | Source: Ambulatory Visit | Attending: Internal Medicine | Admitting: Internal Medicine

## 2011-04-28 DIAGNOSIS — R918 Other nonspecific abnormal finding of lung field: Secondary | ICD-10-CM

## 2011-04-28 DIAGNOSIS — C3490 Malignant neoplasm of unspecified part of unspecified bronchus or lung: Secondary | ICD-10-CM | POA: Insufficient documentation

## 2011-04-28 DIAGNOSIS — M4180 Other forms of scoliosis, site unspecified: Secondary | ICD-10-CM | POA: Insufficient documentation

## 2011-04-28 DIAGNOSIS — I517 Cardiomegaly: Secondary | ICD-10-CM | POA: Insufficient documentation

## 2011-04-28 DIAGNOSIS — J984 Other disorders of lung: Secondary | ICD-10-CM | POA: Insufficient documentation

## 2011-04-28 LAB — GLUCOSE, CAPILLARY: Glucose-Capillary: 100 mg/dL — ABNORMAL HIGH (ref 70–99)

## 2011-04-28 MED ORDER — FLUDEOXYGLUCOSE F - 18 (FDG) INJECTION
19.7000 | Freq: Once | INTRAVENOUS | Status: AC | PRN
  Administered 2011-04-28: 19.7 via INTRAVENOUS

## 2011-05-09 ENCOUNTER — Encounter: Payer: Self-pay | Admitting: Cardiology

## 2011-05-09 ENCOUNTER — Ambulatory Visit (INDEPENDENT_AMBULATORY_CARE_PROVIDER_SITE_OTHER): Payer: Medicare Other | Admitting: *Deleted

## 2011-05-09 ENCOUNTER — Encounter: Payer: Self-pay | Admitting: Internal Medicine

## 2011-05-09 ENCOUNTER — Ambulatory Visit (INDEPENDENT_AMBULATORY_CARE_PROVIDER_SITE_OTHER): Payer: Medicare Other | Admitting: Cardiology

## 2011-05-09 ENCOUNTER — Other Ambulatory Visit: Payer: Self-pay

## 2011-05-09 VITALS — BP 123/62 | HR 98 | Resp 16 | Ht 65.0 in | Wt 123.0 lb

## 2011-05-09 DIAGNOSIS — I495 Sick sinus syndrome: Secondary | ICD-10-CM

## 2011-05-09 DIAGNOSIS — I4891 Unspecified atrial fibrillation: Secondary | ICD-10-CM

## 2011-05-09 DIAGNOSIS — I10 Essential (primary) hypertension: Secondary | ICD-10-CM

## 2011-05-09 LAB — PACEMAKER DEVICE OBSERVATION
AL AMPLITUDE: 0.5 mv
BAMS-0001: 150 {beats}/min
BATTERY VOLTAGE: 2.79 V
BRDY-0002RV: 60 {beats}/min
BRDY-0004RV: 130 {beats}/min
RV LEAD AMPLITUDE: 8 mv
RV LEAD THRESHOLD: 1 V

## 2011-05-09 MED ORDER — METOPROLOL TARTRATE 25 MG PO TABS: 25.0000 mg | ORAL_TABLET | Freq: Three times a day (TID) | ORAL | Status: DC

## 2011-05-09 NOTE — Assessment & Plan Note (Signed)
We will advance Lopressor to 25 mg 3 times a day, otherwise continue remainder of her cardiac medications. Hopefully this will provide better heart rate control, and not exacerbated COPD. Followup is arranged.

## 2011-05-09 NOTE — Assessment & Plan Note (Signed)
Blood pressure is reasonably well controlled today. 

## 2011-05-09 NOTE — Progress Notes (Signed)
Clinical Summary [name] is a 76 y.o.female presenting for followup. Records reviewed including admission to [facility] back in January. She has undergone modification of rate control medications, most recently addition of metoprolol and digoxin to calcium channel blocker therapy. She states that she feels better, has never had any major sense of palpitations however. Breathing status has been stable, no active wheezing on beta blocker.  She did have a device interrogation done today, and heart rate control in general was not optimal. We discussed this. Otherwise she reports no fevers or chills, no cough. Appetite is stable.   Allergies  Allergen Reactions  . Sulfonamide Derivatives     REACTION: rash    Current Outpatient Prescriptions  Medication Sig Dispense Refill  . albuterol (PROVENTIL HFA) 108 (90 BASE) MCG/ACT inhaler Inhale 2 puffs into the lungs every 4 (four) hours as needed.        . ALPRAZolam (XANAX) 0.25 MG tablet Take 0.25 mg by mouth 2 (two) times daily as needed.        . digoxin (LANOXIN) 0.125 MG tablet Take 1 tablet (125 mcg total) by mouth daily.  90 tablet  3  . fluticasone-salmeterol (ADVAIR HFA) 115-21 MCG/ACT inhaler Inhale 2 puffs into the lungs 2 (two) times daily.        . furosemide (LASIX) 40 MG tablet Take 40 mg by mouth as needed.       . raloxifene (EVISTA) 60 MG tablet Take 60 mg by mouth daily.        Marland Kitchen tiotropium (SPIRIVA) 18 MCG inhalation capsule Place 18 mcg into inhaler and inhale daily.        . verapamil (COVERA HS) 240 MG (CO) 24 hr tablet Take 360 mg by mouth daily.       Marland Kitchen warfarin (COUMADIN) 5 MG tablet Take 5 mg by mouth. As directed by coumadin clinic        . DISCONTD: metoprolol tartrate (LOPRESSOR) 25 MG tablet Take 0.5 tablets (12.5 mg total) by mouth 3 (three) times daily.  135 tablet  0  . DISCONTD: metoprolol tartrate (LOPRESSOR) 25 MG tablet Take 1 tablet (25 mg total) by mouth 3 (three) times daily.  270 tablet  1  . metoprolol  tartrate (LOPRESSOR) 25 MG tablet Take 1 tablet (25 mg total) by mouth 3 (three) times daily.  270 tablet  1  . DISCONTD: metoprolol tartrate (LOPRESSOR) 25 MG tablet Take 1 tablet (25 mg total) by mouth 3 (three) times daily.  270 tablet  1  . DISCONTD: metoprolol tartrate (LOPRESSOR) 25 MG tablet Take 1 tablet (25 mg total) by mouth 3 (three) times daily.  270 tablet  1    Past Medical History  Diagnosis Date  . Osteoporosis   . Diverticulosis   . Renal cyst   . Hyperthyroidism   . C. difficile colitis     12/06  . Lumbar and sacral arthritis   . COPD (chronic obstructive pulmonary disease)     PFT 10/05/08 - FEV1 0.89(43%), FVC 1.84(62%), FEV1% 49, TLC 5.64(120%), DLCO 38%  . Hypoxemia     Nocturnal oxygen  . Atrial fibrillation     Paroxysmal  . Sick sinus syndrome     Medtronic PPM    Social History [name] reports that she has quit smoking. Her smoking use included Cigarettes. She has a 20 pack-year smoking history. She has never used smokeless tobacco. [name] reports that she does not drink alcohol.  Review of Systems Negative  except as outlined above.  Physical Examination Filed Vitals:   05/09/11 1425  BP: 123/62  Pulse: 98  Resp: 16   Chronically ill-appearing woman in no acute distress.  HEENT: Conjunctiva and lids normal, oropharynx with poor dentition.  Neck: Supple, no elevated jugular venous pressure, no thyromegaly.  Cardiac: Irregular rate and rhythm, distant, no S3 gallop.  Lungs: Clear with diminished breath sounds throughout, nonlabored, no wheezing.  Abdomen: Soft, no hepatomegaly.  Extremities: Mild venous stasis, distal pulses one plus.  Skin: Warm and dry.  Musculoskeletal: No kyphosis.  Neuropsychiatric: Alert and oriented x3, affect appropriate.    Problem List and Plan

## 2011-05-09 NOTE — Patient Instructions (Signed)
**Note De-Identified Roan Miklos Obfuscation** Your physician has recommended you make the following change in your medication: increase Metoprolol (Lopressor) to 25 mg three times daily  Your physician recommends that you schedule a follow-up appointment in: 3 months

## 2011-05-09 NOTE — Assessment & Plan Note (Signed)
Status post pacemaker placement, followed by Dr. Taylor. 

## 2011-05-09 NOTE — Progress Notes (Signed)
PPM check 

## 2011-05-12 ENCOUNTER — Ambulatory Visit (INDEPENDENT_AMBULATORY_CARE_PROVIDER_SITE_OTHER): Payer: Medicare Other | Admitting: Gastroenterology

## 2011-05-12 ENCOUNTER — Encounter: Payer: Self-pay | Admitting: Gastroenterology

## 2011-05-12 ENCOUNTER — Telehealth: Payer: Self-pay | Admitting: Gastroenterology

## 2011-05-12 VITALS — BP 108/64 | HR 85 | Temp 97.6°F | Ht 62.0 in | Wt 123.4 lb

## 2011-05-12 DIAGNOSIS — K3189 Other diseases of stomach and duodenum: Secondary | ICD-10-CM

## 2011-05-12 DIAGNOSIS — K6289 Other specified diseases of anus and rectum: Secondary | ICD-10-CM

## 2011-05-12 DIAGNOSIS — K629 Disease of anus and rectum, unspecified: Secondary | ICD-10-CM

## 2011-05-12 DIAGNOSIS — R948 Abnormal results of function studies of other organs and systems: Secondary | ICD-10-CM

## 2011-05-12 DIAGNOSIS — K319 Disease of stomach and duodenum, unspecified: Secondary | ICD-10-CM

## 2011-05-12 DIAGNOSIS — K5900 Constipation, unspecified: Secondary | ICD-10-CM | POA: Insufficient documentation

## 2011-05-12 DIAGNOSIS — I4891 Unspecified atrial fibrillation: Secondary | ICD-10-CM

## 2011-05-12 MED ORDER — POLYETHYLENE GLYCOL 3350 17 GM/SCOOP PO POWD: 17.0000 g | Freq: Every day | ORAL | Status: AC

## 2011-05-12 NOTE — Assessment & Plan Note (Signed)
Add miralax 17g daily.

## 2011-05-12 NOTE — Telephone Encounter (Signed)
Patient with h/o AFib on coumadin. Usually managed by Dr. Sherril Croon. Recent hospitalization for pneumonia/copd and at that time had issues reportedly with her AFib/rapid ventricular response.   Please find out if Dr. Diona Browner [facility] in [place]) is okay with [place] holding her coumadin for few days before EGD/TCS.

## 2011-05-12 NOTE — Assessment & Plan Note (Signed)
Abnormal area in rectum on PET. Chronic constipation. No prior colonoscopy. Recommend colonoscopy in near future. Patient concerned about sedation and her breathing status. Previously had difficulty with waking up after cataract surgery. Difficulty breathing with her hysterectomy. Recent bronchoscopy without issues and received fentanyl 50mg  and unknown dose of versed.    I have discussed the risks, alternatives, benefits with regards to but not limited to the risk of reaction to medication, bleeding, infection, perforation and the patient is agreeable to proceed. Written consent to be obtained.

## 2011-05-12 NOTE — Progress Notes (Signed)
Faxed to PCP

## 2011-05-12 NOTE — Telephone Encounter (Signed)
Faxed request to Dr Orson Gear office. I will also send this thru epic.

## 2011-05-12 NOTE — Progress Notes (Signed)
Primary Care Physician:  Ignatius Specking., MD, MD  Primary Gastroenterologist:  Roetta Sessions, MD   Chief Complaint  Patient presents with  . Colonoscopy    HPI:  [name] is a 76 y.o. female here at the request of Dr. Sherril Croon for EGD/TCS. She was recently hospitalized at [facility] with COPD/PNA. Chest CTA showed nodular density in the fundus measuring 2.9cm. PET was done due to lung nodules. There was abnormality seen in rectum. For these reasons EGD/TCS as been recommended.  Patient has never had TCS. She c/o some chronic constipation. ?rectocele. Lumpy stool. BM QOD. No blood or melena. No abdominal pain, rectal pain. Used to have heartburn. Took protonix a few years ago. No dysphagia. 132lb last year. Now weighs 123.4lb. Four hospitalization last year with PNA. Sleep with oxygen at night. PRN at other times. Still with congestion, light green sputum at times. No fever. Mostly white sputum. Coumadin since 2004. Difficulty with heartrate control, recent change in medications.  She worries about sedation. States she took along time waking up after cataract surgery. Had issues with breathing after her hysterectomy. No problems with recent bronchoscopy. Reviewed report, she received unknown dose versed and fentanyl 50mg .    Current Outpatient Prescriptions  Medication Sig Dispense Refill  . albuterol (PROVENTIL HFA) 108 (90 BASE) MCG/ACT inhaler Inhale 2 puffs into the lungs every 4 (four) hours as needed.        . ALPRAZolam (XANAX) 0.25 MG tablet Take 0.25 mg by mouth 2 (two) times daily as needed.        . digoxin (LANOXIN) 0.125 MG tablet Take 1 tablet (125 mcg total) by mouth daily.  90 tablet  3  . fluticasone-salmeterol (ADVAIR HFA) 115-21 MCG/ACT inhaler Inhale 2 puffs into the lungs 2 (two) times daily.        . furosemide (LASIX) 40 MG tablet Take 40 mg by mouth as needed.       . metoprolol tartrate (LOPRESSOR) 25 MG tablet Take 1 tablet (25 mg total) by mouth 3 (three) times daily.   270 tablet  1  . raloxifene (EVISTA) 60 MG tablet Take 60 mg by mouth daily.        Marland Kitchen tiotropium (SPIRIVA) 18 MCG inhalation capsule Place 18 mcg into inhaler and inhale daily.        . verapamil (COVERA HS) 240 MG (CO) 24 hr tablet Take 360 mg by mouth daily.       Marland Kitchen warfarin (COUMADIN) 5 MG tablet Take 5 mg by mouth. As directed by coumadin clinic 2.5 mg on Mon-Wed-Fri 5 mg on Sun- Tues- Thurs-Sat      .      0    Allergies as of 05/12/2011 - Review Complete 05/12/2011  Allergen Reaction Noted  . Sulfonamide derivatives Rash     Past Medical History  Diagnosis Date  . Osteoporosis   . Diverticulosis   . Renal cyst   . Hyperthyroidism   . C. difficile colitis     12/06  . Lumbar and sacral arthritis   . COPD (chronic obstructive pulmonary disease)     PFT 10/05/08 - FEV1 0.89(43%), FVC 1.84(62%), FEV1% 49, TLC 5.64(120%), DLCO 38%  . Hypoxemia     Nocturnal oxygen  . Atrial fibrillation     Paroxysmal  . Sick sinus syndrome     Medtronic PPM 2004  . Pulmonary nodule   . Pneumonia due to Haemophilus influenzae 03/2011    with positive blood cultures as well  Past Surgical History  Procedure Date  . Total hip arthroplasty     Right  . Insert / replace / remove pacemaker     Medtronic  . Abdominal hysterectomy   . Bronchoscopy with washings 03/2011    mucus in right lower lobe with obstruction and some bleeding.  chroinc bronchitis, lung collapse,  Brushings showed numerous acute inflammatory cells and degenerated materal .    Family History  Problem Relation Age of Onset  . Lung cancer Mother     ???patient states she had cancer but not sure what type  . Heart disease Brother   . Colon cancer Neg Hx     History   Social History  . Marital Status: Widowed    Spouse Name: N/A    Number of Children: N/A  . Years of Education: N/A   Occupational History  . Retired     Therapist, occupational   Social History Main Topics  . Smoking status: Former Smoker -- 1.0  packs/day for 20 years    Types: Cigarettes  . Smokeless tobacco: Never Used  . Alcohol Use: No  . Drug Use: No  . Sexually Active: Not on file   Other Topics Concern  . Not on file   Social History Narrative  . No narrative on file      ROS:  General: Negative for anorexia, loss, fever, chills, fatigue, weakness. Positive for weight loss.  Eyes: Negative for vision changes.  ENT: Negative for hoarseness, difficulty swallowing , nasal congestion. CV: Negative for chest pain, angina, palpitations. Chronic DOE. No peripheral edema.  Respiratory: Positive for DOE. Negative for dyspnea at rest Positive for cough, sputum.  GI: See history of present illness. GU:  Negative for dysuria, hematuria, urinary incontinence, urinary frequency, nocturnal urination.  MS: Negative for joint pain, low back pain.  Derm: Negative for rash or itching.  Neuro: Negative for weakness, abnormal sensation, seizure, frequent headaches, memory loss, confusion.  Psych: Negative for anxiety, depression, suicidal ideation, hallucinations.  Endo: See HPI. Heme: Negative for bruising or bleeding. Allergy: Negative for rash or hives.    Physical Examination:  BP 108/64  Pulse 85  Temp(Src) 97.6 F (36.4 C) (Temporal)  Ht 5\' 2"  (1.575 m)  Wt 123 lb 6.4 oz (55.974 kg)  BMI 22.57 kg/m2   General: thin WF in NAD.  Head: Normocephalic, atraumatic.   Eyes: Conjunctiva pink, no icterus. Mouth: Oropharyngeal mucosa moist and pink , no lesions erythema or exudate. Neck: Supple without thyromegaly, masses, or lymphadenopathy.  Lungs: Clear to auscultation bilaterally.  Heart: Regular rate and rhythm, no murmurs rubs or gallops.  Abdomen: Bowel sounds are normal, nontender, nondistended, no hepatosplenomegaly or masses, no abdominal bruits or    hernia , no rebound or guarding.   Rectal: Not performed.  Extremities: No lower extremity edema. No clubbing or deformities.  Neuro: Alert and oriented x 4 ,  grossly normal neurologically.  Skin: Warm and dry, no rash or jaundice.   Psych: Alert and cooperative, normal mood and affect.  Labs: 04/17/2011: Hgb 12, Hct 35.7, MCV 90.7, Plt 202, creatinine 0.51.  Imaging Studies: Nm Pet Image Restag (ps) Skull Base To Thigh  04/28/2011  *RADIOLOGY REPORT*  Clinical Data:  Restaging subsequent therapy for the lung cancer.  NUCLEAR MEDICINE FDG PET CT TUMOR SUBSEQUENT IMAGING- (SKULL BASE THROUGH THIGHS)  Technique:  17.2 mCi F-18 FDG was injected intravenously via the left antecubital fossa.  Full-ring PET imaging was performed from the skull base  through the mid-thighs 92  minutes after injection. CT data was obtained and used for attenuation correction and anatomic localization only.  (This was not acquired as a diagnostic CT examination.)  Fasting Blood Glucose:  111.  Comparison: PET CT 09/20/2008 and chest CT 11/24/2008.  Findings: No residual or recurrent hypermetabolic pulmonary activity is identified.  Specifically, there is no residual increased metabolic activity within the peripheral right upper lobe pulmonary nodule.  This nodule is no longer discernible as a discrete lesion.  There is some right upper lobe scarring in the area of the nodule on images 52 - 55.  SUV max in this region is 1.2.  6 mm left upper lobe perihilar nodule on image 65 has decreased in size and no longer demonstrates any abnormal metabolic activity.  There are diffuse emphysematous changes.  There is some atelectasis in the right middle lobe without abnormal associated metabolic activity.  The patient has a pacemaker.  Cardiomegaly and atherosclerosis are again noted.  No hypermetabolic nodal activity is present within the neck, chest, abdomen or pelvis.  There is no abnormal activity within the liver, adrenal glands or bones.  However, there is focally increased activity along the right wall of the rectum.  This appears more distal than the area previously questioned and is concerning  for a possible polyp or neoplasm.  This has an SUV max of 12.5 and corresponds with mildly increased soft tissue density in this area on the CT images (obscured by artifact from the patient's right total hip arthroplasty).  No other bowel lesions are identified. The urinary bladder has a stable appearance. The patient has a moderate convex right lumbar scoliosis.  IMPRESSION:  1.  Successful treatment of hypermetabolic pulmonary lesions. Current examination demonstrates no abnormal pulmonary activity. 2.  No evidence of metastatic disease. 3.  New focus of increased metabolism along the right wall of the rectum concerning for neoplasm. Sigmoidoscopy is recommended for further assessment.  Original Report Authenticated By: Gerrianne Scale, M.D.

## 2011-05-12 NOTE — Assessment & Plan Note (Signed)
Abnormal finding in antrum on recent Chest CTA at [facility]. See scanned results. Recommend EGD.  I have discussed the risks, alternatives, benefits with regards to but not limited to the risk of reaction to medication, bleeding, infection, perforation and the patient is agreeable to proceed. Written consent to be obtained.  I will touch base with her cardiologist regarding holding coumadin for procedure as she has had recent issues with Afib.

## 2011-05-12 NOTE — Patient Instructions (Addendum)
WE WILL CALL YOU WITH INSTRUCTIONS ABOUT YOUR COUMADIN ONCE I HAVE DISCUSSED WITH DR. MCDOWELL.  Please start miralax one capful daily for constipation. You may buy over the counter.

## 2011-05-13 ENCOUNTER — Other Ambulatory Visit: Payer: Self-pay | Admitting: Gastroenterology

## 2011-05-13 DIAGNOSIS — K629 Disease of anus and rectum, unspecified: Secondary | ICD-10-CM

## 2011-05-14 NOTE — Telephone Encounter (Signed)
Faxed information to Dr. Sherril Croon.

## 2011-05-14 NOTE — Telephone Encounter (Signed)
Please inform Dr. Sherril Croon that we will be holding patients coumadin for upcoming procedures. Thanks.

## 2011-05-14 NOTE — Telephone Encounter (Signed)
[name] was just seen in clinic for followup of her paroxysmal atrial fibrillation, and medication adjustments were made. Dr. Sherril Croon follows her Coumadin in [place]. If she needs to undergo an EGD, it would be reasonable to hold her Coumadin temporarily. Would please also inform Dr. Sherril Croon' office of this so that he can make appropriate arrangements for followup of her Coumadin once it is restarted after the procedure.

## 2011-05-14 NOTE — Progress Notes (Signed)
Received input from Dr. Diona Browner. OK to hold coumadin for few days prior to EGD/TCS. Dr. Sherril Croon' office notified as well. See separate telephone note.  Please let patient know, she needs to hold her coumadin four days before her EGD/tCS. Thanks.

## 2011-05-14 NOTE — Progress Notes (Signed)
[name] is going to inform pt.

## 2011-05-15 ENCOUNTER — Encounter: Payer: Self-pay | Admitting: Gastroenterology

## 2011-05-15 ENCOUNTER — Other Ambulatory Visit: Payer: Self-pay | Admitting: Gastroenterology

## 2011-05-15 MED ORDER — PEG 3350-KCL-NA BICARB-NACL 420 G PO SOLR: ORAL | Status: AC

## 2011-05-15 NOTE — Progress Notes (Signed)
Instructions mailed.

## 2011-05-20 ENCOUNTER — Encounter (HOSPITAL_COMMUNITY): Payer: Self-pay | Admitting: Pharmacy Technician

## 2011-05-22 MED ORDER — SODIUM CHLORIDE 0.45 % IV SOLN
Freq: Once | INTRAVENOUS | Status: AC
  Administered 2011-05-23: 11:00:00 via INTRAVENOUS

## 2011-05-23 ENCOUNTER — Ambulatory Visit (HOSPITAL_COMMUNITY)
Admission: RE | Admit: 2011-05-23 | Discharge: 2011-05-23 | Disposition: A | Discharge status: Home / Self Care | Payer: Medicare Other | Source: Ambulatory Visit | Attending: Internal Medicine | Admitting: Internal Medicine

## 2011-05-23 ENCOUNTER — Encounter (HOSPITAL_COMMUNITY)
Admission: RE | Disposition: A | Discharge status: Home / Self Care | Payer: Self-pay | Source: Ambulatory Visit | Attending: Internal Medicine

## 2011-05-23 ENCOUNTER — Encounter (HOSPITAL_COMMUNITY): Payer: Self-pay | Admitting: *Deleted

## 2011-05-23 DIAGNOSIS — K629 Disease of anus and rectum, unspecified: Secondary | ICD-10-CM

## 2011-05-23 DIAGNOSIS — J4489 Other specified chronic obstructive pulmonary disease: Secondary | ICD-10-CM | POA: Insufficient documentation

## 2011-05-23 DIAGNOSIS — D128 Benign neoplasm of rectum: Secondary | ICD-10-CM | POA: Insufficient documentation

## 2011-05-23 DIAGNOSIS — D126 Benign neoplasm of colon, unspecified: Secondary | ICD-10-CM | POA: Insufficient documentation

## 2011-05-23 DIAGNOSIS — J449 Chronic obstructive pulmonary disease, unspecified: Secondary | ICD-10-CM | POA: Insufficient documentation

## 2011-05-23 DIAGNOSIS — K5730 Diverticulosis of large intestine without perforation or abscess without bleeding: Secondary | ICD-10-CM | POA: Insufficient documentation

## 2011-05-23 DIAGNOSIS — K228 Other specified diseases of esophagus: Secondary | ICD-10-CM

## 2011-05-23 DIAGNOSIS — D129 Benign neoplasm of anus and anal canal: Secondary | ICD-10-CM

## 2011-05-23 DIAGNOSIS — I4891 Unspecified atrial fibrillation: Secondary | ICD-10-CM | POA: Insufficient documentation

## 2011-05-23 DIAGNOSIS — R933 Abnormal findings on diagnostic imaging of other parts of digestive tract: Secondary | ICD-10-CM

## 2011-05-23 DIAGNOSIS — Z7901 Long term (current) use of anticoagulants: Secondary | ICD-10-CM | POA: Insufficient documentation

## 2011-05-23 DIAGNOSIS — R634 Abnormal weight loss: Secondary | ICD-10-CM | POA: Insufficient documentation

## 2011-05-23 DIAGNOSIS — K2100 Gastro-esophageal reflux disease with esophagitis, without bleeding: Secondary | ICD-10-CM | POA: Insufficient documentation

## 2011-05-23 HISTORY — PX: COLONOSCOPY: SHX174

## 2011-05-23 HISTORY — PX: ESOPHAGOGASTRODUODENOSCOPY: SHX1529

## 2011-05-23 SURGERY — COLONOSCOPY WITH ESOPHAGOGASTRODUODENOSCOPY (EGD)
Anesthesia: Moderate Sedation

## 2011-05-23 MED ORDER — MEPERIDINE HCL 100 MG/ML IJ SOLN
INTRAMUSCULAR | Status: AC
  Filled 2011-05-23: qty 2

## 2011-05-23 MED ORDER — SPOT INK MARKER SYRINGE KIT
PACK | SUBMUCOSAL | Status: DC | PRN
  Administered 2011-05-23: 2 mL via SUBMUCOSAL
  Administered 2011-05-23: 5 mL via SUBMUCOSAL

## 2011-05-23 MED ORDER — MEPERIDINE HCL 100 MG/ML IJ SOLN
INTRAMUSCULAR | Status: DC | PRN
  Administered 2011-05-23 (×2): 25 mg via INTRAVENOUS

## 2011-05-23 MED ORDER — STERILE WATER FOR IRRIGATION IR SOLN
Status: DC | PRN
  Administered 2011-05-23: 11:00:00

## 2011-05-23 MED ORDER — MIDAZOLAM HCL 5 MG/5ML IJ SOLN
INTRAMUSCULAR | Status: AC
  Filled 2011-05-23: qty 10

## 2011-05-23 MED ORDER — BUTAMBEN-TETRACAINE-BENZOCAINE 2-2-14 % EX AERO
INHALATION_SPRAY | CUTANEOUS | Status: DC | PRN
  Administered 2011-05-23: 2 via TOPICAL

## 2011-05-23 MED ORDER — MIDAZOLAM HCL 5 MG/5ML IJ SOLN
INTRAMUSCULAR | Status: DC | PRN
  Administered 2011-05-23: 2 mg via INTRAVENOUS
  Administered 2011-05-23: 1 mg via INTRAVENOUS
  Administered 2011-05-23 (×3): 0.5 mg via INTRAVENOUS

## 2011-05-23 NOTE — Interval H&P Note (Signed)
History and Physical Interval Note:  05/23/2011 10:59 AM  Robynn Pane  has presented today for surgery, with the diagnosis of RECTAL LESION, CONSTIPATION  The various methods of treatment have been discussed with the patient and family. After consideration of risks, benefits and other options for treatment, the patient has consented to  Procedure(s) (LRB): COLONOSCOPY WITH ESOPHAGOGASTRODUODENOSCOPY (EGD) (N/A) as a surgical intervention .  The patients' history has been reviewed, patient examined, no change in status, stable for surgery.  I have reviewed the patients' chart and labs.  Questions were answered to the patient's satisfaction.     Eula Listen

## 2011-05-23 NOTE — Discharge Instructions (Addendum)
EGD Discharge instructions Please read the instructions outlined below and refer to this sheet in the next few weeks. These discharge instructions provide you with general information on caring for yourself after you leave the hospital. Your doctor may also give you specific instructions. While your treatment has been planned according to the most current medical practices available, unavoidable complications occasionally occur. If you have any problems or questions after discharge, please call your doctor. ACTIVITY  You may resume your regular activity but move at a slower pace for the next 24 hours.   Take frequent rest periods for the next 24 hours.   Walking will help expel (get rid of) the air and reduce the bloated feeling in your abdomen.   No driving for 24 hours (because of the anesthesia (medicine) used during the test).   You may shower.   Do not sign any important legal documents or operate any machinery for 24 hours (because of the anesthesia used during the test).  NUTRITION  Drink plenty of fluids.   You may resume your normal diet.   Begin with a light meal and progress to your normal diet.   Avoid alcoholic beverages for 24 hours or as instructed by your caregiver.  MEDICATIONS  You may resume your normal medications unless your caregiver tells you otherwise.  WHAT YOU CAN EXPECT TODAY  You may experience abdominal discomfort such as a feeling of fullness or "gas" pains.  FOLLOW-UP  Your doctor will discuss the results of your test with you.  SEEK IMMEDIATE MEDICAL ATTENTION IF ANY OF THE FOLLOWING OCCUR:  Excessive nausea (feeling sick to your stomach) and/or vomiting.   Severe abdominal pain and distention (swelling).   Trouble swallowing.   Temperature over 101 F (37.8 C).   Rectal bleeding or vomiting of blood.    GERD information provided  Begin Protonix 40 mg orally daily  ........ Colonoscopy Discharge Instructions  Read the  instructions outlined below and refer to this sheet in the next few weeks. These discharge instructions provide you with general information on caring for yourself after you leave the hospital. Your doctor may also give you specific instructions. While your treatment has been planned according to the most current medical practices available, unavoidable complications occasionally occur. If you have any problems or questions after discharge, call Dr. Jena Gauss at [phone]. ACTIVITY  You may resume your regular activity, but move at a slower pace for the next 24 hours.   Take frequent rest periods for the next 24 hours.   Walking will help get rid of the air and reduce the bloated feeling in your belly (abdomen).   No driving for 24 hours (because of the medicine (anesthesia) used during the test).    Do not sign any important legal documents or operate any machinery for 24 hours (because of the anesthesia used during the test).  NUTRITION  Drink plenty of fluids.   You may resume your normal diet as instructed by your doctor.   Begin with a light meal and progress to your normal diet. Heavy or fried foods are harder to digest and may make you feel sick to your stomach (nauseated).   Avoid alcoholic beverages for 24 hours or as instructed.  MEDICATIONS  You may resume your normal medications unless your doctor tells you otherwise.  WHAT YOU CAN EXPECT TODAY  Some feelings of bloating in the abdomen.   Passage of more gas than usual.   Spotting of blood in your stool or  on the toilet paper.  IF YOU HAD POLYPS REMOVED DURING THE COLONOSCOPY:  No aspirin products for 7 days or as instructed.   No alcohol for 7 days or as instructed.   Eat a soft diet for the next 24 hours.  FINDING OUT THE RESULTS OF YOUR TEST Not all test results are available during your visit. If your test results are not back during the visit, make an appointment with your caregiver to find out the results. Do not  assume everything is normal if you have not heard from your caregiver or the medical facility. It is important for you to follow up on all of your test results.  SEEK IMMEDIATE MEDICAL ATTENTION IF:  You have more than a spotting of blood in your stool.   Your belly is swollen (abdominal distention).   You are nauseated or vomiting.   You have a temperature over 101.  You have abdominal pain or discomfort that is severe or gets worse throughout the day.     Polyp and diverticulosis information provided.  Resume Coumadin March 2  Further recommendations to follow pending review of pathology report Colon Polyps A polyp is extra tissue that grows inside your body. Colon polyps grow in the large intestine. The large intestine, also called the colon, is part of your digestive system. It is a long, hollow tube at the end of your digestive tract where your body makes and stores stool. Most polyps are not dangerous. They are benign. This means they are not cancerous. But over time, some types of polyps can turn into cancer. Polyps that are smaller than a pea are usually not harmful. But larger polyps could someday become or may already be cancerous. To be safe, doctors remove all polyps and test them.  WHO GETS POLYPS? Anyone can get polyps, but certain people are more likely than others. You may have a greater chance of getting polyps if:  You are over 50.   You have had polyps before.   Someone in your family has had polyps.   Someone in your family has had cancer of the large intestine.   Find out if someone in your family has had polyps. You may also be more likely to get polyps if you:   Eat a lot of fatty foods.   Smoke.   Drink alcohol.   Do not exercise.   Eat too much.  SYMPTOMS  Most small polyps do not cause symptoms. People often do not know they have one until their caregiver finds it during a regular checkup or while testing them for something else. Some people do  have symptoms like these:  Bleeding from the anus. You might notice blood on your underwear or on toilet paper after you have had a bowel movement.   Constipation or diarrhea that lasts more than a week.   Blood in the stool. Blood can make stool look black or it can show up as red streaks in the stool.  If you have any of these symptoms, see your caregiver. HOW DOES THE DOCTOR TEST FOR POLYPS? The doctor can use four tests to check for polyps:  Digital rectal exam. The caregiver wears gloves and checks your rectum (the last part of the large intestine) to see if it feels normal. This test would find polyps only in the rectum. Your caregiver may need to do one of the other tests listed below to find polyps higher up in the intestine.   Barium enema. The  caregiver puts a liquid called barium into your rectum before taking x-rays of your large intestine. Barium makes your intestine look white in the pictures. Polyps are dark, so they are easy to see.   Sigmoidoscopy. With this test, the caregiver can see inside your large intestine. A thin flexible tube is placed into your rectum. The device is called a sigmoidoscope, which has a light and a tiny video camera in it. The caregiver uses the sigmoidoscope to look at the last third of your large intestine.   Colonoscopy. This test is like sigmoidoscopy, but the caregiver looks at all of the large intestine. It usually requires sedation. This is the most common method for finding and removing polyps.  TREATMENT   The caregiver will remove the polyp during sigmoidoscopy or colonoscopy. The polyp is then tested for cancer.   If you have had polyps, your caregiver may want you to get tested regularly in the future.  PREVENTION  There is not one sure way to prevent polyps. You might be able to lower your risk of getting them if you:  Eat more fruits and vegetables and less fatty food.   Do not smoke.   Avoid alcohol.   Exercise every day.    Lose weight if you are overweight.   Eating more calcium and folate can also lower your risk of getting polyps. Some foods that are rich in calcium are milk, cheese, and broccoli. Some foods that are rich in folate are chickpeas, kidney beans, and spinach.   Aspirin might help prevent polyps. Studies are under way.  Document Released: 12/05/2003 Document Revised: 11/20/2010 Document Reviewed: 05/12/2007 [facility] Patient Information 2012 [place], [place].Diverticulosis Diverticulosis is a common condition that develops when small pouches (diverticula) form in the wall of the colon. The risk of diverticulosis increases with age. It happens more often in people who eat a low-fiber diet. Most individuals with diverticulosis have no symptoms. Those individuals with symptoms usually experience abdominal pain, constipation, or loose stools (diarrhea). HOME CARE INSTRUCTIONS   Increase the amount of fiber in your diet as directed by your caregiver or dietician. This may reduce symptoms of diverticulosis.   Your caregiver may recommend taking a dietary fiber supplement.   Drink at least 6 to 8 glasses of water each day to prevent constipation.   Try not to strain when you have a bowel movement.   Your caregiver may recommend avoiding nuts and seeds to prevent complications, although this is still an uncertain benefit.   Only take over-the-counter or prescription medicines for pain, discomfort, or fever as directed by your caregiver.  FOODS WITH HIGH FIBER CONTENT INCLUDE:  Fruits. Apple, peach, pear, tangerine, raisins, prunes.   Vegetables. Brussels sprouts, asparagus, broccoli, cabbage, carrot, cauliflower, romaine lettuce, spinach, summer squash, tomato, winter squash, zucchini.   Starchy Vegetables. Baked beans, kidney beans, lima beans, split peas, lentils, potatoes (with skin).   Grains. Whole wheat bread, brown rice, bran flake cereal, plain oatmeal, white rice, shredded wheat, bran  muffins.  SEEK IMMEDIATE MEDICAL CARE IF:   You develop increasing pain or severe bloating.   You have an oral temperature above 102 F (38.9 C), not controlled by medicine.   You develop vomiting or bowel movements that are bloody or black.  Document Released: 12/06/2003 Document Revised: 11/20/2010 Document Reviewed: 08/08/2009 [facility] Patient Information 2012 [place], [place].

## 2011-05-23 NOTE — H&P (View-Only) (Signed)
Primary Care Physician:  VYAS,DHRUV B., MD, MD  Primary Gastroenterologist:  Michael Rourk, MD   Chief Complaint  Patient presents with  . Colonoscopy    HPI:  [name] is a 76 y.o. female here at the request of Dr. Vyas for EGD/TCS. She was recently hospitalized at [facility] with COPD/PNA. Chest CTA showed nodular density in the fundus measuring 2.9cm. PET was done due to lung nodules. There was abnormality seen in rectum. For these reasons EGD/TCS as been recommended.  Patient has never had TCS. She c/o some chronic constipation. ?rectocele. Lumpy stool. BM QOD. No blood or melena. No abdominal pain, rectal pain. Used to have heartburn. Took protonix a few years ago. No dysphagia. 132lb last year. Now weighs 123.4lb. Four hospitalization last year with PNA. Sleep with oxygen at night. PRN at other times. Still with congestion, light green sputum at times. No fever. Mostly white sputum. Coumadin since 2004. Difficulty with heartrate control, recent change in medications.  She worries about sedation. States she took along time waking up after cataract surgery. Had issues with breathing after her hysterectomy. No problems with recent bronchoscopy. Reviewed report, she received unknown dose versed and fentanyl 50mg.    Current Outpatient Prescriptions  Medication Sig Dispense Refill  . albuterol (PROVENTIL HFA) 108 (90 BASE) MCG/ACT inhaler Inhale 2 puffs into the lungs every 4 (four) hours as needed.        . ALPRAZolam (XANAX) 0.25 MG tablet Take 0.25 mg by mouth 2 (two) times daily as needed.        . digoxin (LANOXIN) 0.125 MG tablet Take 1 tablet (125 mcg total) by mouth daily.  90 tablet  3  . fluticasone-salmeterol (ADVAIR HFA) 115-21 MCG/ACT inhaler Inhale 2 puffs into the lungs 2 (two) times daily.        . furosemide (LASIX) 40 MG tablet Take 40 mg by mouth as needed.       . metoprolol tartrate (LOPRESSOR) 25 MG tablet Take 1 tablet (25 mg total) by mouth 3 (three) times daily.   270 tablet  1  . raloxifene (EVISTA) 60 MG tablet Take 60 mg by mouth daily.        . tiotropium (SPIRIVA) 18 MCG inhalation capsule Place 18 mcg into inhaler and inhale daily.        . verapamil (COVERA HS) 240 MG (CO) 24 hr tablet Take 360 mg by mouth daily.       . warfarin (COUMADIN) 5 MG tablet Take 5 mg by mouth. As directed by coumadin clinic 2.5 mg on Mon-Wed-Fri 5 mg on Sun- Tues- Thurs-Sat      .      0    Allergies as of 05/12/2011 - Review Complete 05/12/2011  Allergen Reaction Noted  . Sulfonamide derivatives Rash     Past Medical History  Diagnosis Date  . Osteoporosis   . Diverticulosis   . Renal cyst   . Hyperthyroidism   . C. difficile colitis     12/06  . Lumbar and sacral arthritis   . COPD (chronic obstructive pulmonary disease)     PFT 10/05/08 - FEV1 0.89(43%), FVC 1.84(62%), FEV1% 49, TLC 5.64(120%), DLCO 38%  . Hypoxemia     Nocturnal oxygen  . Atrial fibrillation     Paroxysmal  . Sick sinus syndrome     [employer] PPM 2004  . Pulmonary nodule   . Pneumonia due to Haemophilus influenzae 03/2011    with positive blood cultures as well      Past Surgical History  Procedure Date  . Total hip arthroplasty     Right  . Insert / replace / remove pacemaker     [employer]  . Abdominal hysterectomy   . Bronchoscopy with washings 03/2011    mucus in right lower lobe with obstruction and some bleeding.  chroinc bronchitis, lung collapse,  Brushings showed numerous acute inflammatory cells and degenerated materal .    Family History  Problem Relation Age of Onset  . Lung cancer Mother     ???patient states she had cancer but not sure what type  . Heart disease Brother   . Colon cancer Neg Hx     History   Social History  . Marital Status: Widowed    Spouse Name: N/A    Number of Children: N/A  . Years of Education: N/A   Occupational History  . Retired     [employer]   Social History Main Topics  . Smoking status: Former Smoker -- 1.0  packs/day for 20 years    Types: Cigarettes  . Smokeless tobacco: Never Used  . Alcohol Use: No  . Drug Use: No  . Sexually Active: Not on file   Other Topics Concern  . Not on file   Social History Narrative  . No narrative on file      ROS:  General: Negative for anorexia, loss, fever, chills, fatigue, weakness. Positive for weight loss.  Eyes: Negative for vision changes.  ENT: Negative for hoarseness, difficulty swallowing , nasal congestion. CV: Negative for chest pain, angina, palpitations. Chronic DOE. No peripheral edema.  Respiratory: Positive for DOE. Negative for dyspnea at rest Positive for cough, sputum.  GI: See history of present illness. GU:  Negative for dysuria, hematuria, urinary incontinence, urinary frequency, nocturnal urination.  MS: Negative for joint pain, low back pain.  Derm: Negative for rash or itching.  Neuro: Negative for weakness, abnormal sensation, seizure, frequent headaches, memory loss, confusion.  Psych: Negative for anxiety, depression, suicidal ideation, hallucinations.  Endo: See HPI. Heme: Negative for bruising or bleeding. Allergy: Negative for rash or hives.    Physical Examination:  BP 108/64  Pulse 85  Temp(Src) 97.6 F (36.4 C) (Temporal)  Ht 5' 2" (1.575 m)  Wt 123 lb 6.4 oz (55.974 kg)  BMI 22.57 kg/m2   General: thin WF in NAD.  Head: Normocephalic, atraumatic.   Eyes: Conjunctiva pink, no icterus. Mouth: Oropharyngeal mucosa moist and pink , no lesions erythema or exudate. Neck: Supple without thyromegaly, masses, or lymphadenopathy.  Lungs: Clear to auscultation bilaterally.  Heart: Regular rate and rhythm, no murmurs rubs or gallops.  Abdomen: Bowel sounds are normal, nontender, nondistended, no hepatosplenomegaly or masses, no abdominal bruits or    hernia , no rebound or guarding.   Rectal: Not performed.  Extremities: No lower extremity edema. No clubbing or deformities.  Neuro: Alert and oriented x 4 ,  grossly normal neurologically.  Skin: Warm and dry, no rash or jaundice.   Psych: Alert and cooperative, normal mood and affect.  Labs: 04/17/2011: Hgb 12, Hct 35.7, MCV 90.7, Plt 202, creatinine 0.51.  Imaging Studies: Nm Pet Image Restag (ps) Skull Base To Thigh  04/28/2011  *RADIOLOGY REPORT*  Clinical Data:  Restaging subsequent therapy for the lung cancer.  NUCLEAR MEDICINE FDG PET CT TUMOR SUBSEQUENT IMAGING- (SKULL BASE THROUGH THIGHS)  Technique:  17.2 mCi F-18 FDG was injected intravenously via the left antecubital fossa.  Full-ring PET imaging was performed from the skull base   through the mid-thighs 92  minutes after injection. CT data was obtained and used for attenuation correction and anatomic localization only.  (This was not acquired as a diagnostic CT examination.)  Fasting Blood Glucose:  111.  Comparison: PET CT 09/20/2008 and chest CT 11/24/2008.  Findings: No residual or recurrent hypermetabolic pulmonary activity is identified.  Specifically, there is no residual increased metabolic activity within the peripheral right upper lobe pulmonary nodule.  This nodule is no longer discernible as a discrete lesion.  There is some right upper lobe scarring in the area of the nodule on images 52 - 55.  SUV max in this region is 1.2.  6 mm left upper lobe perihilar nodule on image 65 has decreased in size and no longer demonstrates any abnormal metabolic activity.  There are diffuse emphysematous changes.  There is some atelectasis in the right middle lobe without abnormal associated metabolic activity.  The patient has a pacemaker.  Cardiomegaly and atherosclerosis are again noted.  No hypermetabolic nodal activity is present within the neck, chest, abdomen or pelvis.  There is no abnormal activity within the liver, adrenal glands or bones.  However, there is focally increased activity along the right wall of the rectum.  This appears more distal than the area previously questioned and is concerning  for a possible polyp or neoplasm.  This has an SUV max of 12.5 and corresponds with mildly increased soft tissue density in this area on the CT images (obscured by artifact from the patient's right total hip arthroplasty).  No other bowel lesions are identified. The urinary bladder has a stable appearance. The patient has a moderate convex right lumbar scoliosis.  IMPRESSION:  1.  Successful treatment of hypermetabolic pulmonary lesions. Current examination demonstrates no abnormal pulmonary activity. 2.  No evidence of metastatic disease. 3.  New focus of increased metabolism along the right wall of the rectum concerning for neoplasm. Sigmoidoscopy is recommended for further assessment.  Original Report Authenticated By: WILLIAM B. VEAZEY, M.D.      

## 2011-05-23 NOTE — Interval H&P Note (Signed)
History and Physical Interval Note:  05/23/2011 10:57 AM  [name]  has presented today for surgery, with the diagnosis of RECTAL LESION, CONSTIPATION  The various methods of treatment have been discussed with the patient and family. After consideration of risks, benefits and other options for treatment, the patient has consented to  Procedure(s) (LRB): COLONOSCOPY WITH ESOPHAGOGASTRODUODENOSCOPY (EGD) (N/A) as a surgical intervention .  The patients' history has been reviewed, patient examined, no change in status, stable for surgery.  I have reviewed the patients' chart and labs.  Questions were answered to the patient's satisfaction.     Eula Listen

## 2011-05-23 NOTE — H&P (View-Only) (Signed)
Received input from Dr. McDowell. OK to hold coumadin for few days prior to EGD/TCS. Dr. Vyas' office notified as well. See separate telephone note.  Please let patient know, she needs to hold her coumadin four days before her EGD/tCS. Thanks. 

## 2011-05-23 NOTE — Op Note (Signed)
[facility] [address] [place], [place]  [address]  COLONOSCOPY PROCEDURE REPORT  PATIENT:  Kelly, [name]  MR#:  [id number] BIRTHDATE:  1936-03-06, 75 yrs. old  GENDER:  female ENDOSCOPIST:  R. Roetta Sessions, MD FACP [facility] REF. BY:  Doreen Beam, M.D. PROCEDURE DATE:  05/23/2011 PROCEDURE:   Colonoscopy with piecemeal snare polypectomy, biopsy, ablation  INDICATIONS:   Abnormal PET activity in the rectum; no prior colonoscopy  INFORMED CONSENT:  The risks, benefits, alternatives and imponderables including but not limited to bleeding, perforation as well as the possibility of a missed lesion have been reviewed. The potential for biopsy, lesion removal, etc. have also been discussed.  Questions have been answered.  All parties agreeable. Please see the history and physical in the medical record for more information.  MEDICATIONS:  Versed 4.5 mg IV and Demerol 50 mg IV in divided doses  DESCRIPTION OF PROCEDURE:  After a digital rectal exam was performed, the [id number] ([id number]) colonoscope was advanced from the anus through the rectum and colon to the area of the cecum, ileocecal valve and appendiceal orifice.  The cecum was deeply intubated.  These structures were well-seen and photographed for the record.  From the level of the cecum and ileocecal valve, the scope was slowly and cautiously withdrawn.  The mucosal surfaces were carefully surveyed utilizing scope tip deflection to facilitate fold flattening as needed.  The scope was pulled down into the rectum where a thorough examination including retroflexion was performed. <<PROCEDUREIMAGES>>  FINDINGS:   Multilobulated polypoid lesion approximately 2 x 3 cm in dimensions straddling a fold at 15 cm in from the anal verge. This was not appreciable on digital rectal exam. A retroflexion examination of the distal rectum was easily performed. There were a couple of diminutive adjacent polyps to the large rectal  lesion.  Patient had extensive left-sided and transverse diverticula. Patient had an 8 mm polyp at the hepatic flexure. There was another diminutive polyp  at the splenic flexure. There was a 6 mm polyp in the midsigmoid segment; the remainder of the colonic mucosa appeared normal.  THERAPEUTIC / DIAGNOSTIC MANEUVERS PERFORMED:  A large rectal polyp was felt to be completely removed with 3 passes of  hot snare cautery in piecemeal fashion.  The site was tatood. Adjacent diminutive polyps were ablated with the tip of a hot snare cautery loop. The sigmoid and hepatic flexure polyps were hot snare removed. The splenic flexure polyp was cold biopsied/removed. All polyps seen were removed or ablated.  COMPLICATIONS:  None  CECAL WITHDRAWAL TIME:  31 minutes  IMPRESSION:  Multiple colorectal polyps-treated as described above. The large rectal polyp likely corresponds to the area of abnormal PET activity  RECOMMENDATIONS:   Resume Coumadin on March 2. See EGD report. Further recommendations to follow pending review of pathology report  ______________________________ R. Roetta Sessions, MD Caleen Essex  CC:  Doreen Beam, M.D.  n. eSIGNED:   R. Roetta Sessions at 05/23/2011 12:22 PM  Earnie Larsson, [phone]

## 2011-05-23 NOTE — Op Note (Signed)
[facility] [address] [place], [place]  [address]  ENDOSCOPY PROCEDURE REPORT  PATIENT:  [name], [name]  MR#:  [id number] BIRTHDATE:  May 08, 1935, 75 yrs. old  GENDER:  female  ENDOSCOPIST:  R. Roetta Sessions, MD Caleen Essex Referred by:  Doreen Beam, M.D.  PROCEDURE DATE:  05/23/2011 PROCEDURE:  Diagnostic EGD  INDICATIONS:   abnormal antrum on CT recently  INFORMED CONSENT:   The risks, benefits, limitations, alternatives and imponderables have been discussed.  The potential for biopsy, esophogeal dilation, etc. have also been reviewed.  Questions have been answered.  All parties agreeable.  Please see the history and physical in the medical record for more information.  MEDICATIONS:    Versed 3 mg IV and Demerol 50 mg IV in divided doses. Cetacaine spray.  DESCRIPTION OF PROCEDURE:   The EG-[id number] ([id number]) endoscope was introduced through the mouth and advanced to the second portion of the duodenum without difficulty or limitations.  The mucosal surfaces were surveyed very carefully during advancement of the scope and upon withdrawal.  Retroflexion view of the proximal stomach and esophagogastric junction was performed.  <<PROCEDUREIMAGES>>  FINDINGS:   Couple of tiny distal esophageal erosions; otherwise the tubular esophagus appeared entirely normal. Stomach empty. Small hiatal hernia. Stomach inflated very well. thorough examination of the gastric mucosa including a retroflexion view and close examination of the antrum demonstrated no other abnormalities. There is no ulcer or infiltrating process or extrinsic compression. The pyloric channel was traversed easily. Examination of bulb and second portion revealed no abnormalities.   THERAPEUTIC / DIAGNOSTIC MANEUVERS PERFORMED:    None  COMPLICATIONS:   None  IMPRESSION:    Tiny distal esophageal erosions consistent with mild  reflux esophagitis;  Otherwise, the  gastric mucosa appeared normal. No  tumor seen; no extrinsic compression seen. Normal first and second portion of the duodenum  RECOMMENDATIONS:    See colonoscopy report.  ______________________________ R. Roetta Sessions, MD Caleen Essex  CC:  n. eSIGNED:   R. Roetta Sessions at 05/23/2011 11:28 AM  Earnie Larsson, [phone]

## 2011-05-26 ENCOUNTER — Encounter: Payer: Self-pay | Admitting: Internal Medicine

## 2011-05-28 NOTE — Progress Notes (Signed)
REVIEWED.  

## 2011-08-06 ENCOUNTER — Encounter: Payer: Self-pay | Admitting: Cardiology

## 2011-08-06 ENCOUNTER — Ambulatory Visit (INDEPENDENT_AMBULATORY_CARE_PROVIDER_SITE_OTHER): Payer: Medicare Other | Admitting: Cardiology

## 2011-08-06 VITALS — BP 135/81 | HR 85 | Resp 16 | Ht 65.0 in | Wt 126.0 lb

## 2011-08-06 DIAGNOSIS — I4891 Unspecified atrial fibrillation: Secondary | ICD-10-CM

## 2011-08-06 DIAGNOSIS — I10 Essential (primary) hypertension: Secondary | ICD-10-CM

## 2011-08-06 DIAGNOSIS — Z950 Presence of cardiac pacemaker: Secondary | ICD-10-CM

## 2011-08-06 NOTE — Assessment & Plan Note (Signed)
Followed by Dr. Taylor 

## 2011-08-06 NOTE — Progress Notes (Signed)
Clinical Summary [name] is a 76 y.o.female presenting for followup. She was seen in February. Fortunately, she reports no major progression in palpitations or chest pain. Has had some difficulty with shortness of breath during the pollen season with significant COPD.  She reports compliance with her rate control regimen, was not able to tolerate metoprolol at 3 times a day however. She has done well at twice daily. She reports no bleeding problems on Coumadin.  Record review finds that she is also undergoing a GI evaluation related to abnormality within the rectum.   Allergies  Allergen Reactions  . Sulfonamide Derivatives Rash    REACTION: rash    Current Outpatient Prescriptions  Medication Sig Dispense Refill  . albuterol (PROVENTIL HFA) 108 (90 BASE) MCG/ACT inhaler Inhale 2 puffs into the lungs every 4 (four) hours as needed. For shortness of breath      . ALPRAZolam (XANAX) 0.25 MG tablet Take 0.25 mg by mouth 2 (two) times daily as needed. For anxiety      . Calcium Carbonate-Vitamin D (CALCIUM + D PO) Take 1 tablet by mouth daily.      . digoxin (LANOXIN) 0.125 MG tablet Take 1 tablet (125 mcg total) by mouth daily.  90 tablet  3  . fluticasone-salmeterol (ADVAIR HFA) 115-21 MCG/ACT inhaler Inhale 2 puffs into the lungs 2 (two) times daily.        . furosemide (LASIX) 40 MG tablet Take 40 mg by mouth as needed. For fluid      . metoprolol tartrate (LOPRESSOR) 25 MG tablet Take 25 mg by mouth 2 (two) times daily.       . Multiple Vitamin (MULITIVITAMIN WITH MINERALS) TABS Take 1 tablet by mouth daily.      . pantoprazole (PROTONIX) 40 MG tablet Take 40 mg by mouth daily.      . raloxifene (EVISTA) 60 MG tablet Take 60 mg by mouth daily.        Marland Kitchen tiotropium (SPIRIVA) 18 MCG inhalation capsule Place 18 mcg into inhaler and inhale daily.        . verapamil (COVERA HS) 240 MG (CO) 24 hr tablet Take 360 mg by mouth daily.       Marland Kitchen warfarin (COUMADIN) 5 MG tablet Take 2.5-5 mg by  mouth daily. As directed by coumadin clinic 2.5 mg on Mon-Wed-Fri 5 mg on Sun- Tues- Thurs-Sat        Past Medical History  Diagnosis Date  . Osteoporosis   . Diverticulosis   . Renal cyst   . Hyperthyroidism   . C. difficile colitis     12/06  . Lumbar and sacral arthritis   . COPD (chronic obstructive pulmonary disease)     PFT 10/05/08 - FEV1 0.89(43%), FVC 1.84(62%), FEV1% 49, TLC 5.64(120%), DLCO 38%  . Hypoxemia     Nocturnal oxygen  . Atrial fibrillation     Paroxysmal  . Sick sinus syndrome     Medtronic PPM 2004  . Pulmonary nodule   . Pneumonia due to Haemophilus influenzae 03/2011    with positive blood cultures as well    Social History [name] reports that she has quit smoking. Her smoking use included Cigarettes. She has a 20 pack-year smoking history. She has never used smokeless tobacco. [name] reports that she does not drink alcohol.  Review of Systems Stable appetite, no falls or syncope. No changes in bowel habits. Otherwise negative except as outlined.  Physical Examination Filed Vitals:  08/06/11 0922  BP: 135/81  Pulse: 85  Resp: 16   Chronically ill-appearing woman in no acute distress.  HEENT: Conjunctiva and lids normal, oropharynx with poor dentition.  Neck: Supple, no elevated jugular venous pressure, no thyromegaly.  Cardiac: Irregular rate and rhythm, distant, no S3 gallop.  Lungs: Clear with diminished breath sounds throughout, nonlabored, no wheezing.  Abdomen: Soft, no hepatomegaly.  Extremities: Mild venous stasis, distal pulses one plus.    Problem List and Plan   PAROXYSMAL ATRIAL FIBRILLATION Relatively stable on current medical regimen. No changes made, continue current doses of metoprolol, digoxin, and verapamil. She also continues on Coumadin.  ESSENTIAL HYPERTENSION, BENIGN Blood pressure is reasonably well controlled.  CARDIAC PACEMAKER IN SITU Followed by Dr. [name].     Jonelle Sidle, M.D.,  F.A.C.C.

## 2011-08-06 NOTE — Assessment & Plan Note (Signed)
Blood pressure is reasonably well controlled. ?

## 2011-08-06 NOTE — Patient Instructions (Signed)
Your physician recommends that you schedule a follow-up appointment in: 3 months.  

## 2011-08-06 NOTE — Assessment & Plan Note (Signed)
Relatively stable on current medical regimen. No changes made, continue current doses of metoprolol, digoxin, and verapamil. She also continues on Coumadin.

## 2011-08-30 DIAGNOSIS — R0602 Shortness of breath: Secondary | ICD-10-CM

## 2011-09-01 DIAGNOSIS — R000 Tachycardia, unspecified: Secondary | ICD-10-CM

## 2011-09-10 ENCOUNTER — Telehealth: Payer: Self-pay | Admitting: Cardiology

## 2011-09-10 ENCOUNTER — Other Ambulatory Visit: Payer: Self-pay

## 2011-09-10 MED ORDER — VERAPAMIL HCL 240 MG (CO) PO TB24: ORAL_TABLET | ORAL | Status: DC

## 2011-09-10 NOTE — Telephone Encounter (Signed)
Received call from pharmacy, trx.

## 2011-09-10 NOTE — Telephone Encounter (Signed)
**Note De-Identified [place] Obfuscation** RX sent to [facility] for #135 with 1 refill and RX sent to [facility] drug for #21 with no refills per pt. Request, pt. awrae./LV

## 2011-09-10 NOTE — Telephone Encounter (Signed)
Pharmacy called and stated that Verapamil was increased per patient to 1 and 1/2 tab daily.  Script ran out because pharm has 1 tab daily.  Please contact pharmacy and advise if change is correct.

## 2011-09-11 ENCOUNTER — Telehealth: Payer: Self-pay | Admitting: Cardiology

## 2011-09-11 ENCOUNTER — Other Ambulatory Visit: Payer: Self-pay

## 2011-09-11 MED ORDER — DIGOXIN 125 MCG PO TABS: 125.0000 ug | ORAL_TABLET | Freq: Every day | ORAL | Status: DC

## 2011-09-11 MED ORDER — METOPROLOL TARTRATE 25 MG PO TABS: 25.0000 mg | ORAL_TABLET | Freq: Two times a day (BID) | ORAL | Status: DC

## 2011-09-11 MED ORDER — VERAPAMIL HCL 240 MG (CO) PO TB24: ORAL_TABLET | ORAL | Status: DC

## 2011-09-11 NOTE — Telephone Encounter (Signed)
Needs Verapamil, Digoxin and Toprol sent to [facility]. Please call patient she states she needs to ask some questions about her meds. / tg

## 2011-09-11 NOTE — Telephone Encounter (Signed)
**Note De-Identified [name] Obfuscation** RX's sent to [facility] to fill and all questions answered./LV

## 2011-09-12 ENCOUNTER — Other Ambulatory Visit: Payer: Self-pay | Admitting: *Deleted

## 2011-09-12 MED ORDER — DIGOXIN 125 MCG PO TABS: 125.0000 ug | ORAL_TABLET | Freq: Every day | ORAL | Status: DC

## 2011-09-17 ENCOUNTER — Telehealth: Payer: Self-pay | Admitting: Cardiology

## 2011-09-17 MED ORDER — VERAPAMIL HCL 240 MG (CO) PO TB24: ORAL_TABLET | ORAL | Status: DC

## 2011-09-17 MED ORDER — METOPROLOL TARTRATE 25 MG PO TABS: 25.0000 mg | ORAL_TABLET | Freq: Two times a day (BID) | ORAL | Status: DC

## 2011-09-17 NOTE — Telephone Encounter (Signed)
Patient's medications were called in wrong and to wrong pharmacy. Needs Verapamil and Metoprolol sent to [facility]. / tg

## 2011-09-22 ENCOUNTER — Other Ambulatory Visit: Payer: Self-pay | Admitting: *Deleted

## 2011-09-22 ENCOUNTER — Other Ambulatory Visit: Payer: Self-pay

## 2011-09-22 MED ORDER — VERAPAMIL HCL ER 240 MG PO TBCR: EXTENDED_RELEASE_TABLET | ORAL | Status: DC

## 2011-10-02 ENCOUNTER — Encounter: Payer: Medicare Other | Admitting: Internal Medicine

## 2011-11-12 ENCOUNTER — Ambulatory Visit (INDEPENDENT_AMBULATORY_CARE_PROVIDER_SITE_OTHER): Payer: Medicare Other | Admitting: Cardiology

## 2011-11-12 ENCOUNTER — Encounter: Payer: Self-pay | Admitting: Cardiology

## 2011-11-12 VITALS — BP 112/61 | HR 76 | Ht 65.0 in | Wt 130.0 lb

## 2011-11-12 DIAGNOSIS — R0989 Other specified symptoms and signs involving the circulatory and respiratory systems: Secondary | ICD-10-CM

## 2011-11-12 DIAGNOSIS — I495 Sick sinus syndrome: Secondary | ICD-10-CM

## 2011-11-12 DIAGNOSIS — I4891 Unspecified atrial fibrillation: Secondary | ICD-10-CM

## 2011-11-12 NOTE — Progress Notes (Signed)
Clinical Summary [name] is a 76 y.o.female presenting for followup. She was seen in May. She continues to do well with good heart rate control on the current regimen. She reports no bleeding problems with Coumadin.  Medications reviewed. Lasix has been changed to HCTZ by Dr. Sherril Croon in the interim.  ECG shows a ventricular paced rhythm at 74 BPM. She is due for a device check soon.   Allergies  Allergen Reactions  . Sulfonamide Derivatives Rash    REACTION: rash    Current Outpatient Prescriptions  Medication Sig Dispense Refill  . albuterol (PROVENTIL HFA) 108 (90 BASE) MCG/ACT inhaler Inhale 2 puffs into the lungs every 4 (four) hours as needed. For shortness of breath      . ALPRAZolam (XANAX) 0.25 MG tablet Take 0.25 mg by mouth 2 (two) times daily as needed. For anxiety      . Calcium Carbonate-Vitamin D (CALCIUM + D PO) Take 1 tablet by mouth daily.      . digoxin (LANOXIN) 0.125 MG tablet Take 1 tablet (125 mcg total) by mouth daily.  90 tablet  3  . fluticasone-salmeterol (ADVAIR HFA) 115-21 MCG/ACT inhaler Inhale 2 puffs into the lungs 2 (two) times daily.        . hydrochlorothiazide (MICROZIDE) 12.5 MG capsule Take 12.5 mg by mouth daily.      . metoprolol tartrate (LOPRESSOR) 25 MG tablet Take 1 tablet (25 mg total) by mouth 2 (two) times daily.  180 tablet  3  . Multiple Vitamin (MULITIVITAMIN WITH MINERALS) TABS Take 1 tablet by mouth daily.      . pantoprazole (PROTONIX) 40 MG tablet Take 40 mg by mouth daily.      . raloxifene (EVISTA) 60 MG tablet Take 60 mg by mouth daily.        Marland Kitchen tiotropium (SPIRIVA) 18 MCG inhalation capsule Place 18 mcg into inhaler and inhale daily.        . verapamil (CALAN-SR) 240 MG CR tablet Take 240 mg (whole tablet) in the mornings and 120 mg (1/2 tablet) in the evenings  135 tablet  1  . warfarin (COUMADIN) 5 MG tablet Take 2.5-5 mg by mouth daily. As directed by coumadin clinic 2.5 mg on Mon-Wed-Fri 5 mg on Sun- Tues- Thurs-Sat          Past Medical History  Diagnosis Date  . Osteoporosis   . Diverticulosis   . Renal cyst   . Hyperthyroidism   . C. difficile colitis     12/06  . Lumbar and sacral arthritis   . COPD (chronic obstructive pulmonary disease)     PFT 10/05/08 - FEV1 0.89(43%), FVC 1.84(62%), FEV1% 49, TLC 5.64(120%), DLCO 38%  . Hypoxemia     Nocturnal oxygen  . Atrial fibrillation     Paroxysmal  . Sick sinus syndrome     Medtronic PPM 2004  . Pulmonary nodule   . Pneumonia due to Haemophilus influenzae 03/2011    with positive blood cultures as well    Past Surgical History  Procedure Date  . Total hip arthroplasty     Right  . Insert / replace / remove pacemaker     Medtronic  . Abdominal hysterectomy   . Bronchoscopy with washings 03/2011    mucus in right lower lobe with obstruction and some bleeding.  chroinc bronchitis, lung collapse,  Brushings showed numerous acute inflammatory cells and degenerated materal .    Social History [name] reports that she has quit  smoking. Her smoking use included Cigarettes. She has a 20 pack-year smoking history. She has never used smokeless tobacco. [name] reports that she does not drink alcohol.  Review of Systems No progressive palpitations. Appetite OK. Still using oxygen PRN. Otherwise negative.  Physical Examination Filed Vitals:   11/12/11 1011  BP: 112/61  Pulse: 76    Chronically ill-appearing woman in no acute distress.  HEENT: Conjunctiva and lids normal, oropharynx with poor dentition.  Neck: Supple, no elevated jugular venous pressure, no thyromegaly.  Cardiac: Irregular rate and rhythm, very distant, no S3 gallop.  Lungs: Clear with diminished breath sounds throughout, nonlabored, no wheezing.  Abdomen: Soft, no hepatomegaly.  Extremities: Venous stasis, varicosities, distal pulses one plus.    Problem List and Plan   PAROXYSMAL ATRIAL FIBRILLATION Continue current regimen and observation. Will arrange a 6 month  visit, sooner if needed.  SICK SINUS SYNDROME Status post PPM. Keep followup in the device clinic.    Jonelle Sidle, M.D., F.A.C.C.

## 2011-11-12 NOTE — Patient Instructions (Addendum)
Your physician recommends that you schedule a follow-up appointment in: 6 months  

## 2011-11-12 NOTE — Assessment & Plan Note (Signed)
Continue current regimen and observation. Will arrange a 6 month visit, sooner if needed.

## 2011-11-12 NOTE — Assessment & Plan Note (Signed)
Status post PPM. Keep followup in the device clinic.

## 2011-11-14 DIAGNOSIS — J449 Chronic obstructive pulmonary disease, unspecified: Secondary | ICD-10-CM

## 2011-11-18 ENCOUNTER — Encounter: Payer: Medicare Other | Admitting: Internal Medicine

## 2011-12-02 ENCOUNTER — Encounter: Payer: Self-pay | Admitting: *Deleted

## 2011-12-24 ENCOUNTER — Encounter: Payer: Self-pay | Admitting: *Deleted

## 2011-12-29 ENCOUNTER — Encounter: Payer: Medicare Other | Admitting: Internal Medicine

## 2012-01-05 ENCOUNTER — Ambulatory Visit (INDEPENDENT_AMBULATORY_CARE_PROVIDER_SITE_OTHER): Payer: Medicare Other | Admitting: Internal Medicine

## 2012-01-05 ENCOUNTER — Encounter: Payer: Self-pay | Admitting: Internal Medicine

## 2012-01-05 VITALS — BP 112/64 | HR 67 | Ht 65.0 in | Wt 131.4 lb

## 2012-01-05 DIAGNOSIS — I4891 Unspecified atrial fibrillation: Secondary | ICD-10-CM

## 2012-01-05 DIAGNOSIS — I10 Essential (primary) hypertension: Secondary | ICD-10-CM

## 2012-01-05 DIAGNOSIS — Z950 Presence of cardiac pacemaker: Secondary | ICD-10-CM

## 2012-01-05 LAB — PACEMAKER DEVICE OBSERVATION
AL AMPLITUDE: 0.5 mv
BAMS-0001: 150 {beats}/min
RV LEAD IMPEDENCE PM: 502 Ohm
RV LEAD THRESHOLD: 0.75 V

## 2012-01-05 NOTE — Assessment & Plan Note (Signed)
She now has persistent atrial fibrillation. Her ventricular rate appears to be well-controlled. No change in medical therapy.

## 2012-01-05 NOTE — Assessment & Plan Note (Signed)
Her blood pressure today is well controlled. No change in medical therapy. She will maintain a low-sodium diet. 

## 2012-01-05 NOTE — Assessment & Plan Note (Signed)
Her Medtronic dual-chamber pacemaker is working normally. We'll plan to recheck in several months. 

## 2012-01-05 NOTE — Progress Notes (Signed)
HPI [name] returns today for followup. She is a 76 year old woman with long-standing COPD, chronic diastolic heart failure, symptomatic bradycardia, chronic atrial fibrillation, status post permanent pacemaker insertion. In the interim she has been stable. She notes that she has some good days and some days where she is more short of breath and tired. She has minimal amounts of peripheral edema. She does note audible wheezing at times. Minimal cough. Allergies  Allergen Reactions  . Sulfonamide Derivatives Rash    REACTION: rash     Current Outpatient Prescriptions  Medication Sig Dispense Refill  . ALPRAZolam (XANAX) 0.25 MG tablet Take 0.25 mg by mouth 2 (two) times daily as needed. For anxiety      . cephALEXin (KEFLEX) 500 MG capsule Take 500 mg by mouth 3 (three) times daily.      . digoxin (LANOXIN) 0.125 MG tablet Take 1 tablet (125 mcg total) by mouth daily.  90 tablet  3  . fluticasone-salmeterol (ADVAIR HFA) 115-21 MCG/ACT inhaler Inhale 2 puffs into the lungs 2 (two) times daily.        . hydrochlorothiazide (MICROZIDE) 12.5 MG capsule Take 12.5 mg by mouth as needed.       . metoprolol tartrate (LOPRESSOR) 25 MG tablet Take 1 tablet (25 mg total) by mouth 2 (two) times daily.  180 tablet  3  . Multiple Vitamin (MULITIVITAMIN WITH MINERALS) TABS Take 1 tablet by mouth daily.      . pantoprazole (PROTONIX) 40 MG tablet Take 40 mg by mouth daily.      . raloxifene (EVISTA) 60 MG tablet Take 60 mg by mouth daily.        Marland Kitchen tiotropium (SPIRIVA) 18 MCG inhalation capsule Place 18 mcg into inhaler and inhale daily.        . verapamil (CALAN-SR) 240 MG CR tablet Take 240 mg (whole tablet) in the mornings and 120 mg (1/2 tablet) in the evenings  135 tablet  1  . warfarin (COUMADIN) 5 MG tablet Take 2.5-5 mg by mouth daily. As directed by coumadin clinic 2.5 mg on Mon-Wed-Fri 5 mg on Sun- Tues- Thurs-Sat         Past Medical History  Diagnosis Date  . Osteoporosis   .  Diverticulosis   . Renal cyst   . Hyperthyroidism   . C. difficile colitis     12/06  . Lumbar and sacral arthritis   . COPD (chronic obstructive pulmonary disease)     PFT 10/05/08 - FEV1 0.89(43%), FVC 1.84(62%), FEV1% 49, TLC 5.64(120%), DLCO 38%  . Hypoxemia     Nocturnal oxygen  . Atrial fibrillation     Paroxysmal  . Sick sinus syndrome     Medtronic PPM 2004  . Pulmonary nodule   . Pneumonia due to Haemophilus influenzae 03/2011    with positive blood cultures as well    ROS:   All systems reviewed and negative except as noted in the HPI.   Past Surgical History  Procedure Date  . Total hip arthroplasty     Right  . Insert / replace / remove pacemaker     Medtronic  . Abdominal hysterectomy   . Bronchoscopy with washings 03/2011    mucus in right lower lobe with obstruction and some bleeding.  chroinc bronchitis, lung collapse,  Brushings showed numerous acute inflammatory cells and degenerated materal .     Family History  Problem Relation Age of Onset  . Lung cancer Mother     ???patient states  she had cancer but not sure what type  . Heart disease Brother   . Colon cancer Neg Hx      History   Social History  . Marital Status: Widowed    Spouse Name: N/A    Number of Children: N/A  . Years of Education: N/A   Occupational History  . Retired     Therapist, occupational   Social History Main Topics  . Smoking status: Former Smoker -- 1.0 packs/day for 20 years    Types: Cigarettes  . Smokeless tobacco: Never Used  . Alcohol Use: No  . Drug Use: No  . Sexually Active: Not on file   Other Topics Concern  . Not on file   Social History Narrative  . No narrative on file     BP 112/64  Pulse 67  Ht 5\' 5"  (1.651 m)  Wt 131 lb 6.4 oz (59.603 kg)  BMI 21.87 kg/m2  SpO2 91%  Physical Exam:  Well appearing 76 year old woman, NAD HEENT: Unremarkable Neck:  No JVD, no thyromegally Lungs:  Clear with scattered expiratory wheezes. No rales or  rhonchi HEART:  IRegular rate rhythm, no murmurs, no rubs, no clicks Abd:  soft, positive bowel sounds, no organomegally, no rebound, no guarding Ext:  2 plus pulses, no edema, no cyanosis, no clubbing Skin:  No rashes no nodules Neuro:  CN II through XII intact, motor grossly intact  DEVICE  Normal device function.  See PaceArt for details.   Assess/Plan:

## 2012-01-09 ENCOUNTER — Encounter: Payer: Self-pay | Admitting: Internal Medicine

## 2012-05-17 ENCOUNTER — Ambulatory Visit: Payer: Medicare Other | Admitting: Cardiology

## 2012-07-06 ENCOUNTER — Other Ambulatory Visit: Payer: Self-pay | Admitting: Internal Medicine

## 2012-07-06 ENCOUNTER — Ambulatory Visit (INDEPENDENT_AMBULATORY_CARE_PROVIDER_SITE_OTHER): Payer: Medicare Other | Admitting: *Deleted

## 2012-07-06 DIAGNOSIS — I4891 Unspecified atrial fibrillation: Secondary | ICD-10-CM

## 2012-07-06 DIAGNOSIS — I495 Sick sinus syndrome: Secondary | ICD-10-CM

## 2012-07-06 LAB — PACEMAKER DEVICE OBSERVATION
AL AMPLITUDE: 0.5 mv
BAMS-0001: 150 {beats}/min
RV LEAD IMPEDENCE PM: 528 Ohm
RV LEAD THRESHOLD: 0.75 V
VENTRICULAR PACING PM: 4

## 2012-07-06 NOTE — Progress Notes (Signed)
PPM check 

## 2012-07-22 ENCOUNTER — Encounter: Payer: Self-pay | Admitting: Internal Medicine

## 2012-07-26 ENCOUNTER — Ambulatory Visit (INDEPENDENT_AMBULATORY_CARE_PROVIDER_SITE_OTHER): Payer: Medicare Other | Admitting: Cardiology

## 2012-07-26 ENCOUNTER — Encounter: Payer: Self-pay | Admitting: Cardiology

## 2012-07-26 VITALS — BP 120/70 | HR 106 | Wt 127.0 lb

## 2012-07-26 DIAGNOSIS — R39198 Other difficulties with micturition: Secondary | ICD-10-CM

## 2012-07-26 DIAGNOSIS — R3989 Other symptoms and signs involving the genitourinary system: Secondary | ICD-10-CM

## 2012-07-26 DIAGNOSIS — I10 Essential (primary) hypertension: Secondary | ICD-10-CM

## 2012-07-26 DIAGNOSIS — I4891 Unspecified atrial fibrillation: Secondary | ICD-10-CM

## 2012-07-26 DIAGNOSIS — Z950 Presence of cardiac pacemaker: Secondary | ICD-10-CM

## 2012-07-26 DIAGNOSIS — R0602 Shortness of breath: Secondary | ICD-10-CM

## 2012-07-26 NOTE — Patient Instructions (Addendum)
Your physician recommends that you schedule a follow-up appointment in: 3 MONTHS  Your physician has requested that you have an echocardiogram. Echocardiography is a painless test that uses sound waves to create images of your heart. It provides your doctor with information about the size and shape of your heart and how well your heart's chambers and valves are working. This procedure takes approximately one hour. There are no restrictions for this procedure.  A REFERRAL WAS PLACED FOR UROLOGY A staff member from our office will alert you the with appointment date and time, once available

## 2012-07-26 NOTE — Progress Notes (Signed)
Clinical Summary [name] is a 77 y.o.female last seen in August 2013. She has also maintained interval device checks. She states that for the last several weeks she has had difficulty urinating during the daytime, not specifically dysuria. She states she drinks a lot of water, has also been on a regular diuretic, but does not make urine on a regular basis. She stated Dr. Sherril Croon was going to send her to a urologist in [place], but she did not want to go, and requests a different referral. She also states that she had blood work done a few weeks ago, results to be requested.  From a cardiac perspective she does notice that her heart rate has been more elevated than usual, reports compliance with her regular medications which were effective at last visit. Her ECG today shows atrial fibrillation at 106 beats per minute, nonspecific ST-T changes. She reports a sense of bloating, no leg edema. Weight is down from the last visit.  Last echocardiogram was in January 2013, LVEF 60-65%, mild left atrial enlargement, mild aortic and mitral regurgitation, RVSP 45-50 mm mercury consistent with moderate pulmonary hypertension..   Allergies  Allergen Reactions  . Cardizem (Diltiazem Hcl)   . Sulfonamide Derivatives Rash    REACTION: rash    Current Outpatient Prescriptions  Medication Sig Dispense Refill  . ALPRAZolam (XANAX) 0.25 MG tablet Take 0.25 mg by mouth 2 (two) times daily as needed. For anxiety      . digoxin (LANOXIN) 0.125 MG tablet Take 1 tablet (125 mcg total) by mouth daily.  90 tablet  3  . fluticasone-salmeterol (ADVAIR HFA) 115-21 MCG/ACT inhaler Inhale 2 puffs into the lungs 2 (two) times daily.        . Furosemide (LASIX PO) Take 1 tablet by mouth daily.      . hydrochlorothiazide (MICROZIDE) 12.5 MG capsule Take 12.5 mg by mouth as needed.       . metoprolol tartrate (LOPRESSOR) 25 MG tablet Take 1 tablet (25 mg total) by mouth 2 (two) times daily.  180 tablet  3  . Multiple Vitamin  (MULITIVITAMIN WITH MINERALS) TABS Take 1 tablet by mouth daily.      . pantoprazole (PROTONIX) 40 MG tablet Take 40 mg by mouth daily.      . raloxifene (EVISTA) 60 MG tablet Take 60 mg by mouth daily.        Marland Kitchen tiotropium (SPIRIVA) 18 MCG inhalation capsule Place 18 mcg into inhaler and inhale daily.        . verapamil (CALAN-SR) 240 MG CR tablet Take 240 mg (whole tablet) in the mornings and 120 mg (1/2 tablet) in the evenings  135 tablet  1  . warfarin (COUMADIN) 5 MG tablet Take 2.5-5 mg by mouth daily. As directed by coumadin clinic 2.5 mg on Mon-Wed-Fri 5 mg on Sun- Tues- Thurs-Sat       No current facility-administered medications for this visit.    Past Medical History  Diagnosis Date  . Osteoporosis   . Diverticulosis   . Renal cyst   . Hyperthyroidism   . C. difficile colitis     12/06  . Lumbar and sacral arthritis   . COPD (chronic obstructive pulmonary disease)     PFT 10/05/08 - FEV1 0.89(43%), FVC 1.84(62%), FEV1% 49, TLC 5.64(120%), DLCO 38%  . Hypoxemia     Nocturnal oxygen  . Atrial fibrillation     Paroxysmal  . Sick sinus syndrome     Medtronic PPM 2004  .  Pulmonary nodule   . Pneumonia due to Haemophilus influenzae 03/2011    with positive blood cultures as well    Past Surgical History  Procedure Laterality Date  . Total hip arthroplasty      Right  . Insert / replace / remove pacemaker      Medtronic  . Abdominal hysterectomy    . Bronchoscopy with washings  03/2011    mucus in right lower lobe with obstruction and some bleeding.  chroinc bronchitis, lung collapse,  Brushings showed numerous acute inflammatory cells and degenerated materal .    Social History [name] reports that she has quit smoking. Her smoking use included Cigarettes. She has a 20 pack-year smoking history. She has never used smokeless tobacco. [name] reports that she does not drink alcohol.  Review of Systems Negative except as outlined.  Physical  Examination Filed Vitals:   07/26/12 1436  BP: 120/70  Pulse: 106   Filed Weights   07/26/12 1436  Weight: 127 lb (57.607 kg)    Chronically ill-appearing woman in no acute distress.  HEENT: Conjunctiva and lids normal, oropharynx with poor dentition.  Neck: Supple, elevated jugular venous pressure, no thyromegaly.  Cardiac: Irregular rate and rhythm, very distant, no S3 gallop.  Lungs: Diminished breath sounds throughout, nonlabored, no wheezing.  Abdomen: Soft, protuberant, no hepatomegaly.  Extremities: Venous stasis, varicosities, distal pulses one plus.  Muscular skeletal: No kyphosis. Skin: Warm and dry. Neuropsychiatric: Alert and oriented x3, affect appropriate.   Problem List and Plan   Permanent atrial fibrillation Continue strategy of heart rate control and anticoagulation. She has a pacemaker in place, therefore no concern for symptomatic bradycardia. Medications have been adjusted over time, were affected at the last visit. Plan to followup echocardiogram in light of her other reported symptoms to reassess LV function prior to making any other adjustments.  CARDIAC PACEMAKER-Medtronic Keep followup with Dr. [name].  Difficulty urinating Not sure what to make of this at present. She states she drinks plenty of fluids, is also on a diuretic. She urinates mainly at nighttime. Her weight is down, not suggestive that she is significantly volume overloaded but she does feel a sense of "bloating." She was referred to a urologist by Dr. Sherril Croon, however requests a different referral, and we will assist with this. Reports no dysuria, no history of kidney stones. She had recent lab work which will be requested for review in terms of renal function.  ESSENTIAL HYPERTENSION, BENIGN Blood pressure is normal today.    Jonelle Sidle, M.D., F.A.C.C.

## 2012-07-26 NOTE — Assessment & Plan Note (Signed)
Not sure what to make of this at present. She states she drinks plenty of fluids, is also on a diuretic. She urinates mainly at nighttime. Her weight is down, not suggestive that she is significantly volume overloaded but she does feel a sense of "bloating." She was referred to a urologist by Dr. Sherril Croon, however requests a different referral, and we will assist with this. Reports no dysuria, no history of kidney stones. She had recent lab work which will be requested for review in terms of renal function.

## 2012-07-26 NOTE — Assessment & Plan Note (Signed)
Continue strategy of heart rate control and anticoagulation. She has a pacemaker in place, therefore no concern for symptomatic bradycardia. Medications have been adjusted over time, were affected at the last visit. Plan to followup echocardiogram in light of her other reported symptoms to reassess LV function prior to making any other adjustments.

## 2012-07-26 NOTE — Assessment & Plan Note (Signed)
Blood pressure is normal today. 

## 2012-07-26 NOTE — Assessment & Plan Note (Signed)
Keep followup with Dr. Taylor. 

## 2012-07-27 ENCOUNTER — Encounter: Payer: Self-pay | Admitting: Internal Medicine

## 2012-07-29 ENCOUNTER — Ambulatory Visit: Payer: Medicare Other | Admitting: Gastroenterology

## 2012-08-02 ENCOUNTER — Ambulatory Visit (HOSPITAL_COMMUNITY): Payer: Medicare Other

## 2012-08-11 ENCOUNTER — Encounter: Payer: Self-pay | Admitting: Cardiology

## 2012-09-08 ENCOUNTER — Inpatient Hospital Stay (HOSPITAL_COMMUNITY)
Admission: EM | Admit: 2012-09-08 | Discharge: 2012-09-15 | DRG: 871 | Disposition: A | Discharge status: Home Health | Payer: Medicare Other | Attending: Internal Medicine | Admitting: Internal Medicine

## 2012-09-08 ENCOUNTER — Emergency Department (HOSPITAL_COMMUNITY): Payer: Medicare Other

## 2012-09-08 ENCOUNTER — Encounter (HOSPITAL_COMMUNITY): Payer: Self-pay

## 2012-09-08 DIAGNOSIS — Z9981 Dependence on supplemental oxygen: Secondary | ICD-10-CM

## 2012-09-08 DIAGNOSIS — J9610 Chronic respiratory failure, unspecified whether with hypoxia or hypercapnia: Secondary | ICD-10-CM

## 2012-09-08 DIAGNOSIS — A419 Sepsis, unspecified organism: Principal | ICD-10-CM

## 2012-09-08 DIAGNOSIS — Z7901 Long term (current) use of anticoagulants: Secondary | ICD-10-CM

## 2012-09-08 DIAGNOSIS — I4821 Permanent atrial fibrillation: Secondary | ICD-10-CM

## 2012-09-08 DIAGNOSIS — Z950 Presence of cardiac pacemaker: Secondary | ICD-10-CM

## 2012-09-08 DIAGNOSIS — M47817 Spondylosis without myelopathy or radiculopathy, lumbosacral region: Secondary | ICD-10-CM | POA: Diagnosis present

## 2012-09-08 DIAGNOSIS — Z96649 Presence of unspecified artificial hip joint: Secondary | ICD-10-CM

## 2012-09-08 DIAGNOSIS — K219 Gastro-esophageal reflux disease without esophagitis: Secondary | ICD-10-CM | POA: Diagnosis present

## 2012-09-08 DIAGNOSIS — R39198 Other difficulties with micturition: Secondary | ICD-10-CM

## 2012-09-08 DIAGNOSIS — I4891 Unspecified atrial fibrillation: Secondary | ICD-10-CM | POA: Diagnosis present

## 2012-09-08 DIAGNOSIS — J449 Chronic obstructive pulmonary disease, unspecified: Secondary | ICD-10-CM | POA: Diagnosis present

## 2012-09-08 DIAGNOSIS — Z87891 Personal history of nicotine dependence: Secondary | ICD-10-CM

## 2012-09-08 DIAGNOSIS — IMO0002 Reserved for concepts with insufficient information to code with codable children: Secondary | ICD-10-CM

## 2012-09-08 DIAGNOSIS — I495 Sick sinus syndrome: Secondary | ICD-10-CM | POA: Diagnosis present

## 2012-09-08 DIAGNOSIS — K3189 Other diseases of stomach and duodenum: Secondary | ICD-10-CM

## 2012-09-08 DIAGNOSIS — E876 Hypokalemia: Secondary | ICD-10-CM | POA: Diagnosis present

## 2012-09-08 DIAGNOSIS — E0590 Thyrotoxicosis, unspecified without thyrotoxic crisis or storm: Secondary | ICD-10-CM | POA: Diagnosis present

## 2012-09-08 DIAGNOSIS — E860 Dehydration: Secondary | ICD-10-CM | POA: Diagnosis present

## 2012-09-08 DIAGNOSIS — E43 Unspecified severe protein-calorie malnutrition: Secondary | ICD-10-CM | POA: Diagnosis present

## 2012-09-08 DIAGNOSIS — W19XXXA Unspecified fall, initial encounter: Secondary | ICD-10-CM | POA: Diagnosis present

## 2012-09-08 DIAGNOSIS — J4489 Other specified chronic obstructive pulmonary disease: Secondary | ICD-10-CM | POA: Diagnosis present

## 2012-09-08 DIAGNOSIS — M810 Age-related osteoporosis without current pathological fracture: Secondary | ICD-10-CM | POA: Diagnosis present

## 2012-09-08 DIAGNOSIS — I10 Essential (primary) hypertension: Secondary | ICD-10-CM

## 2012-09-08 DIAGNOSIS — R948 Abnormal results of function studies of other organs and systems: Secondary | ICD-10-CM

## 2012-09-08 DIAGNOSIS — Q619 Cystic kidney disease, unspecified: Secondary | ICD-10-CM

## 2012-09-08 DIAGNOSIS — R112 Nausea with vomiting, unspecified: Secondary | ICD-10-CM | POA: Diagnosis present

## 2012-09-08 DIAGNOSIS — J189 Pneumonia, unspecified organism: Secondary | ICD-10-CM | POA: Diagnosis present

## 2012-09-08 DIAGNOSIS — K629 Disease of anus and rectum, unspecified: Secondary | ICD-10-CM

## 2012-09-08 LAB — BASIC METABOLIC PANEL
BUN: 9 mg/dL (ref 6–23)
Calcium: 8.1 mg/dL — ABNORMAL LOW (ref 8.4–10.5)
Creatinine, Ser: 0.64 mg/dL (ref 0.50–1.10)
GFR calc Af Amer: >90 mL/min (ref >90)

## 2012-09-08 LAB — URINALYSIS, ROUTINE W REFLEX MICROSCOPIC
Bilirubin Urine: NEGATIVE
Ketones, ur: 15 mg/dL — AB
Leukocytes, UA: NEGATIVE
Nitrite: NEGATIVE
Specific Gravity, Urine: 1.01 (ref 1.005–1.030)
Urobilinogen, UA: 0.2 mg/dL (ref 0.0–1.0)
pH: 7 (ref 5.0–8.0)

## 2012-09-08 LAB — DIGOXIN LEVEL: Digoxin Level: 1.1 ng/mL (ref 0.8–2.0)

## 2012-09-08 LAB — CBC WITH DIFFERENTIAL/PLATELET
Basophils Absolute: 0 10*3/uL (ref 0.0–0.1)
Basophils Relative: 0 % (ref 0–1)
Eosinophils Relative: 0 % (ref 0–5)
HCT: 37.6 % (ref 36.0–46.0)
Lymphocytes Relative: 6 % — ABNORMAL LOW (ref 12–46)
MCHC: 31.9 g/dL (ref 30.0–36.0)
Monocytes Absolute: 1.1 10*3/uL — ABNORMAL HIGH (ref 0.1–1.0)
Neutro Abs: 18.6 10*3/uL — ABNORMAL HIGH (ref 1.7–7.7)
Platelets: 228 10*3/uL (ref 150–400)
RDW: 15.4 % (ref 11.5–15.5)
WBC: 21 10*3/uL — ABNORMAL HIGH (ref 4.0–10.5)

## 2012-09-08 LAB — TROPONIN I: Troponin I: <0.3 ng/mL (ref <0.30)

## 2012-09-08 LAB — PROTIME-INR
INR: 1.92 — ABNORMAL HIGH (ref 0.00–1.49)
Prothrombin Time: 21.2 seconds — ABNORMAL HIGH (ref 11.6–15.2)

## 2012-09-08 MED ORDER — MOMETASONE FURO-FORMOTEROL FUM 100-5 MCG/ACT IN AERO
INHALATION_SPRAY | RESPIRATORY_TRACT | Status: AC
  Filled 2012-09-08: qty 8.8

## 2012-09-08 MED ORDER — ACETAMINOPHEN 650 MG RE SUPP: 650.0000 mg | Freq: Four times a day (QID) | RECTAL | Status: DC | PRN

## 2012-09-08 MED ORDER — LEVOFLOXACIN IN D5W 500 MG/100ML IV SOLN
500.0000 mg | Freq: Once | INTRAVENOUS | Status: AC
  Administered 2012-09-08: 500 mg via INTRAVENOUS
  Filled 2012-09-08: qty 100

## 2012-09-08 MED ORDER — ALPRAZOLAM 0.25 MG PO TABS
0.2500 mg | ORAL_TABLET | Freq: Every day | ORAL | Status: DC
  Administered 2012-09-08: 0.25 mg via ORAL
  Filled 2012-09-08: qty 1

## 2012-09-08 MED ORDER — ONDANSETRON HCL 4 MG PO TABS: 4.0000 mg | ORAL_TABLET | Freq: Four times a day (QID) | ORAL | Status: DC | PRN

## 2012-09-08 MED ORDER — SODIUM CHLORIDE 0.9 % IV BOLUS (SEPSIS)
1000.0000 mL | Freq: Once | INTRAVENOUS | Status: AC
  Administered 2012-09-08: 1000 mL via INTRAVENOUS

## 2012-09-08 MED ORDER — DIGOXIN 125 MCG PO TABS
125.0000 ug | ORAL_TABLET | Freq: Every day | ORAL | Status: DC
  Administered 2012-09-08 – 2012-09-15 (×8): 125 ug via ORAL
  Filled 2012-09-08 (×8): qty 1

## 2012-09-08 MED ORDER — VANCOMYCIN HCL IN DEXTROSE 1-5 GM/200ML-% IV SOLN
1000.0000 mg | Freq: Once | INTRAVENOUS | Status: AC
  Administered 2012-09-08: 1000 mg via INTRAVENOUS
  Filled 2012-09-08: qty 200

## 2012-09-08 MED ORDER — SODIUM CHLORIDE 0.9 % IV SOLN
INTRAVENOUS | Status: DC
  Administered 2012-09-08: 16:00:00 via INTRAVENOUS

## 2012-09-08 MED ORDER — WARFARIN SODIUM 2.5 MG PO TABS: 2.5000 mg | ORAL_TABLET | Freq: Every day | ORAL | Status: DC

## 2012-09-08 MED ORDER — VANCOMYCIN HCL 500 MG IV SOLR
500.0000 mg | Freq: Two times a day (BID) | INTRAVENOUS | Status: DC
  Administered 2012-09-09 – 2012-09-10 (×4): 500 mg via INTRAVENOUS
  Filled 2012-09-08 (×9): qty 500

## 2012-09-08 MED ORDER — WARFARIN - PHARMACIST DOSING INPATIENT: Freq: Every day | Status: DC

## 2012-09-08 MED ORDER — VANCOMYCIN HCL IN DEXTROSE 1-5 GM/200ML-% IV SOLN
INTRAVENOUS | Status: AC
  Filled 2012-09-08: qty 200

## 2012-09-08 MED ORDER — ONDANSETRON HCL 4 MG/2ML IJ SOLN
4.0000 mg | Freq: Four times a day (QID) | INTRAMUSCULAR | Status: DC | PRN
  Administered 2012-09-09 – 2012-09-11 (×2): 4 mg via INTRAVENOUS
  Filled 2012-09-08 (×2): qty 2

## 2012-09-08 MED ORDER — POTASSIUM CHLORIDE IN NACL 20-0.9 MEQ/L-% IV SOLN
INTRAVENOUS | Status: DC
  Administered 2012-09-08 – 2012-09-10 (×2): via INTRAVENOUS
  Administered 2012-09-11: 1000 mL via INTRAVENOUS
  Administered 2012-09-12: 07:00:00 via INTRAVENOUS

## 2012-09-08 MED ORDER — GUAIFENESIN ER 600 MG PO TB12
600.0000 mg | ORAL_TABLET | Freq: Two times a day (BID) | ORAL | Status: DC
  Administered 2012-09-08 – 2012-09-15 (×14): 600 mg via ORAL
  Filled 2012-09-08 (×15): qty 1

## 2012-09-08 MED ORDER — PIPERACILLIN-TAZOBACTAM 3.375 G IVPB
INTRAVENOUS | Status: AC
  Filled 2012-09-08: qty 100

## 2012-09-08 MED ORDER — ACETAMINOPHEN 325 MG PO TABS
650.0000 mg | ORAL_TABLET | Freq: Once | ORAL | Status: AC
  Administered 2012-09-08: 650 mg via ORAL
  Filled 2012-09-08: qty 2

## 2012-09-08 MED ORDER — ACETAMINOPHEN 325 MG PO TABS
650.0000 mg | ORAL_TABLET | Freq: Four times a day (QID) | ORAL | Status: DC | PRN
  Administered 2012-09-09 – 2012-09-10 (×2): 650 mg via ORAL
  Filled 2012-09-08 (×2): qty 2

## 2012-09-08 MED ORDER — LEVALBUTEROL HCL 0.63 MG/3ML IN NEBU: 0.6300 mg | INHALATION_SOLUTION | RESPIRATORY_TRACT | Status: DC | PRN

## 2012-09-08 MED ORDER — PANTOPRAZOLE SODIUM 40 MG PO TBEC
40.0000 mg | DELAYED_RELEASE_TABLET | Freq: Every day | ORAL | Status: DC
  Administered 2012-09-08 – 2012-09-11 (×4): 40 mg via ORAL
  Filled 2012-09-08 (×4): qty 1

## 2012-09-08 MED ORDER — WARFARIN SODIUM 5 MG PO TABS
5.0000 mg | ORAL_TABLET | Freq: Once | ORAL | Status: AC
  Administered 2012-09-08: 5 mg via ORAL
  Filled 2012-09-08 (×2): qty 1

## 2012-09-08 MED ORDER — MOMETASONE FURO-FORMOTEROL FUM 100-5 MCG/ACT IN AERO
2.0000 | INHALATION_SPRAY | Freq: Two times a day (BID) | RESPIRATORY_TRACT | Status: DC
  Administered 2012-09-08 – 2012-09-15 (×14): 2 via RESPIRATORY_TRACT
  Filled 2012-09-08: qty 8.8

## 2012-09-08 MED ORDER — LEVALBUTEROL HCL 0.63 MG/3ML IN NEBU
0.6300 mg | INHALATION_SOLUTION | Freq: Four times a day (QID) | RESPIRATORY_TRACT | Status: DC
  Administered 2012-09-08 – 2012-09-11 (×11): 0.63 mg via RESPIRATORY_TRACT
  Filled 2012-09-08 (×11): qty 3

## 2012-09-08 MED ORDER — IPRATROPIUM BROMIDE 0.02 % IN SOLN
0.5000 mg | Freq: Four times a day (QID) | RESPIRATORY_TRACT | Status: DC
  Administered 2012-09-08 – 2012-09-11 (×11): 0.5 mg via RESPIRATORY_TRACT
  Filled 2012-09-08 (×11): qty 2.5

## 2012-09-08 MED ORDER — POTASSIUM CHLORIDE CRYS ER 20 MEQ PO TBCR
40.0000 meq | EXTENDED_RELEASE_TABLET | Freq: Once | ORAL | Status: AC
  Administered 2012-09-08: 40 meq via ORAL
  Filled 2012-09-08: qty 2

## 2012-09-08 MED ORDER — SODIUM CHLORIDE 0.9 % IV BOLUS (SEPSIS)
500.0000 mL | Freq: Once | INTRAVENOUS | Status: AC
  Administered 2012-09-08: 500 mL via INTRAVENOUS

## 2012-09-08 MED ORDER — PIPERACILLIN-TAZOBACTAM 3.375 G IVPB
3.3750 g | Freq: Three times a day (TID) | INTRAVENOUS | Status: DC
  Administered 2012-09-08 – 2012-09-12 (×12): 3.375 g via INTRAVENOUS
  Filled 2012-09-08 (×15): qty 50

## 2012-09-08 NOTE — Progress Notes (Signed)
ANTIBIOTIC CONSULT NOTE - INITIAL  Pharmacy Consult for Vancomycin & Zosyn Indication: HCAP / Aspiration pneumonia  Allergies  Allergen Reactions  . Cardizem (Diltiazem Hcl) Itching and Rash  . Sulfonamide Derivatives Rash    Patient Measurements: Height: 5\' 5"  (165.1 cm) Weight: 128 lb 15.5 oz (58.5 kg) IBW/kg (Calculated) : 57  Vital Signs: Temp: 98.2 F (36.8 C) (06/18 1906) Temp src: Oral (06/18 1906) BP: 132/81 mmHg (06/18 1920) Pulse Rate: 105 (06/18 1920) Intake/Output from previous day:   Intake/Output from this shift:    Labs:  Recent Labs  09/08/12 1450  WBC 21.0*  HGB 12.0  PLT 228  CREATININE 0.64   Estimated Creatinine Clearance: 55.5 ml/min (by C-G formula based on Cr of 0.64). No results found for this basename: VANCOTROUGH, VANCOPEAK, VANCORANDOM, GENTTROUGH, GENTPEAK, GENTRANDOM, TOBRATROUGH, TOBRAPEAK, TOBRARND, AMIKACINPEAK, AMIKACINTROU, AMIKACIN,  in the last 72 hours   Microbiology: No results found for this or any previous visit (from the past 720 hour(s)).  Medical History: Past Medical History  Diagnosis Date  . Osteoporosis   . Diverticulosis   . Renal cyst   . Hyperthyroidism   . C. difficile colitis     12/06  . Lumbar and sacral arthritis   . COPD (chronic obstructive pulmonary disease)     PFT 10/05/08 - FEV1 0.89(43%), FVC 1.84(62%), FEV1% 49, TLC 5.64(120%), DLCO 38%  . Hypoxemia     Nocturnal oxygen  . Atrial fibrillation     Paroxysmal  . Sick sinus syndrome     Medtr[employer] PPM 2004  . Pulmonary nodule   . Pneumonia due to Haemophilus influenzae 03/2011    with positive blood cultures as well    Medications:  Scheduled:  . ALPRAZolam  0.25 mg Oral QHS  . digoxin  125 mcg Oral Daily  . guaiFENesin  600 mg Oral BID  . ipratropium  0.5 mg Nebulization Q6H  . levalbuterol  0.63 mg Nebulization Q6H  . mometasone-formoterol  2 puff Inhalation BID  . pantoprazole  40 mg Oral Daily  . piperacillin-tazobactam (ZOSYN)  IV   3.375 g Intravenous Q8H  . [START ON 09/09/2012] vancomycin  500 mg Intravenous Q12H  . vancomycin  1,000 mg Intravenous Once  . warfarin  5 mg Oral Once  . [START ON 09/09/2012] Warfarin - Pharmacist Dosing Inpatient   Does not apply q1800   Assessment: Vancomycin & Zosyn per pharmacy protocol Patient weight 58.5 kg CrCl > 30 ml/min  Goal of Therapy:  Vancomycin trough level 15-20 mcg/ml  Plan:  Vancomycin 1 GM IV loading dose, then 500 mg IV every 12 hours Zosyn 3.375 GM IV every 8 hours Vancomycin trough at steady state Labs per protocol  Raquel James, Neeka Urista Willeen Cass 09/08/2012,7:46 PM

## 2012-09-08 NOTE — H&P (Signed)
[facility] History and Physical  [name]:[id number] DOB: Dec 23, 1935 DOA: 09/08/2012  Referring physician: Ivery Quale, ER PA PCP: Ignatius Specking., MD  Specialists:   Chief Complaint: weakness  HPI: [name] is a 77 y.o. female who has oxygen-dependent COPD, chronic atrial fibrillation, sick sinus syndrome status post pacemaker and multiple other medical problems. Patient was brought to the emergency room today when she had a fall. She reported feeling increasingly weak today to the point that she had difficulty getting up and walking. She did manage to stand and walk, but felt so weak and dizzy that she ended up falling. She denied any loss of consciousness or head trauma. Denies a chest pain. She reports that she was experiencing chills overnight. She has not noticed any change in her chronic cough, although she has experienced worsening shortness of breath over the past few days. She denies any sick contacts. She has had vomiting and dry heaves. She's not had any diarrhea. Her by mouth intake has been poor over the past several days. She was recently seen at [facility] approximately 2 weeks ago for a possible urinary tract infection. She was felt to be dehydrated at that time as well. They did send her home on Keflex. She completed her antibiotic course but reports that she still felt poorly. She was evaluated in the emergency room where she was noted to be febrile, tachycardic and hypotensive. Imaging reveals a developing pneumonia. She's been referred for admission.  Review of Systems: Pertinent positives as per HPI, otherwise negative  Past Medical History  Diagnosis Date  . Osteoporosis   . Diverticulosis   . Renal cyst   . Hyperthyroidism   . C. difficile colitis     12/06  . Lumbar and sacral arthritis   . COPD (chronic obstructive pulmonary disease)     PFT 10/05/08 - FEV1 0.89(43%), FVC 1.84(62%), FEV1% 49, TLC 5.64(120%), DLCO 38%  .  Hypoxemia     Nocturnal oxygen  . Atrial fibrillation     Paroxysmal  . Sick sinus syndrome     Medtronic PPM 2004  . Pulmonary nodule   . Pneumonia due to Haemophilus influenzae 03/2011    with positive blood cultures as well   Past Surgical History  Procedure Laterality Date  . Total hip arthroplasty      Right  . Insert / replace / remove pacemaker      Medtronic  . Abdominal hysterectomy    . Bronchoscopy with washings  03/2011    mucus in right lower lobe with obstruction and some bleeding.  chroinc bronchitis, lung collapse,  Brushings showed numerous acute inflammatory cells and degenerated materal .  . Esophagogastroduodenoscopy  05/23/2011    JYN:WGNF distal esophageal erosions consistent with mild  reflux esophagitis/otherwise normal  . Colonoscopy  05/23/2011    AOZ:HYQMVHQI colorectal polyps-treated/large rectal polyp likely corresponds to the area of abnormal PET activity   Social History:  reports that she has quit smoking. Her smoking use included Cigarettes. She has a 20 pack-year smoking history. She has never used smokeless tobacco. She reports that she does not drink alcohol or use illicit drugs.   Allergies  Allergen Reactions  . Cardizem (Diltiazem Hcl) Itching and Rash  . Sulfonamide Derivatives Rash    Family History  Problem Relation Age of Onset  . Lung cancer Mother     ???patient states she had cancer but not sure what type  . Heart disease Brother   . Colon  cancer Neg Hx     Prior to Admission medications   Medication Sig Start Date End Date Taking? Authorizing Provider  ALPRAZolam (XANAX) 0.25 MG tablet Take 0.25 mg by mouth at bedtime. For anxiety   Yes Historical Provider, MD  digoxin (LANOXIN) 0.125 MG tablet Take 1 tablet (125 mcg total) by mouth daily. 09/12/11  Yes Jodelle Gross, NP  fluticasone-salmeterol (ADVAIR HFA) [phone] MCG/ACT inhaler Inhale 2 puffs into the lungs 2 (two) times daily.     Yes Historical Provider, MD  furosemide  (LASIX) 40 MG tablet Take 40 mg by mouth daily as needed. Fluid retention   Yes Historical Provider, MD  metaxalone (SKELAXIN) 800 MG tablet Take 400 mg by mouth daily as needed for pain.   Yes Historical Provider, MD  metoprolol tartrate (LOPRESSOR) 25 MG tablet Take 1 tablet (25 mg total) by mouth 2 (two) times daily. 09/17/11 09/16/12 Yes Jonelle Sidle, MD  pantoprazole (PROTONIX) 40 MG tablet Take 40 mg by mouth daily.   Yes Historical Provider, MD  raloxifene (EVISTA) 60 MG tablet Take 60 mg by mouth daily.     Yes Historical Provider, MD  tiotropium (SPIRIVA) 18 MCG inhalation capsule Place 18 mcg into inhaler and inhale daily.     Yes Historical Provider, MD  verapamil (CALAN-SR) 240 MG CR tablet Take 120-240 mg by mouth 2 (two) times daily. Take 240 mg (whole tablet) in the mornings and 120 mg (1/2 tablet) in the evenings 09/22/11  Yes Jonelle Sidle, MD  warfarin (COUMADIN) 5 MG tablet Take 2.5-5 mg by mouth daily. As directed by coumadin clinic 5 mg on Mon-Wed-Fri 2.5 mg on Sun- Tues- Thurs-Sat   Yes Historical Provider, MD   Physical Exam: Filed Vitals:   09/08/12 1453 09/08/12 1514 09/08/12 1517 09/08/12 1525  BP: 112/58 110/78 120/104 89/48  Pulse: 115 113 112 130  Temp: 101.8 F (38.8 C)     TempSrc: Rectal     Resp: 21     SpO2:         General: No acute distress   Eyes: Pupils are equal round reactive to light  ENT: Mucous membranes are dry  Neck: Supple  Cardiovascular: S1, S2, irregular, tachycardic  Respiratory: Rhonchi bilaterally  Abdomen: Soft, nontender, nondistended, positive bowel sounds  Skin: No rashes  Musculoskeletal: No pedal edema bilaterally  Psychiatric: Normal affect, cooperative with exam  Neurologic: Grossly intact, nonfocal  Labs on Admission:  Basic Metabolic Panel:  Recent Labs Lab 09/08/12 1450  NA 137  K 3.2*  CL 97  CO2 32  GLUCOSE 98  BUN 9  CREATININE 0.64  CALCIUM 8.1*   Liver Function Tests: No results  found for this basename: AST, ALT, ALKPHOS, BILITOT, PROT, ALBUMIN,  in the last 168 hours No results found for this basename: LIPASE, AMYLASE,  in the last 168 hours No results found for this basename: AMMONIA,  in the last 168 hours CBC:  Recent Labs Lab 09/08/12 1450  WBC 21.0*  NEUTROABS 18.6*  HGB 12.0  HCT 37.6  MCV 90.0  PLT 228   Cardiac Enzymes:  Recent Labs Lab 09/08/12 1450  TROPONINI <0.30    BNP (last 3 results) No results found for this basename: PROBNP,  in the last 8760 hours CBG: No results found for this basename: GLUCAP,  in the last 168 hours  Radiological Exams on Admission: Dg Ribs Unilateral W/chest Right  09/08/2012   *RADIOLOGY REPORT*  Clinical Data: Right posterior chest pain  secondary to a fall this morning.  RIGHT RIBS AND CHEST - 3+ VIEW  Comparison: Chest x-ray dated 11/14/2011 and chest CT dated 04/09/2012  Findings: There is an old healed fracture of the anterior lateral aspect of the right eighth rib.  No acute rib fractures.  However, the patient does have a new patchy infiltrate in the right midzone superimposed on severe emphysematous disease.  Heart size and pulmonary vascularity is normal.  Dual lead pacer in place.  IMPRESSION:  1.  No acute rib fractures. 2.  New patchy infiltrate in the right midzone superimposed on severe emphysema.  The possibility of aspiration pneumonitis should be considered.   Original Report Authenticated By: Francene Boyers, M.D.   Dg Pelvis 1-2 Views  09/08/2012   *RADIOLOGY REPORT*  Clinical Data: Pelvis pain secondary to a fall this morning.  PELVIS - 1-2 VIEW  Comparison: Radiographs of 05/18/2007 and 04/07/2007  Findings: There is no acute fracture or dislocation.  Right total hip prosthesis in place.  Avascular necrosis of the left femoral head without collapse of the articular surface.  IMPRESSION: No acute abnormality.   Original Report Authenticated By: Francene Boyers, M.D.   Ct Head Wo Contrast  09/08/2012    *RADIOLOGY REPORT*  Clinical Data: Altered level of consciousness secondary to a fall today.  CT HEAD WITHOUT CONTRAST  Technique:  Contiguous axial images were obtained from the base of the skull through the vertex without contrast.  Comparison: None.  Findings: There is no acute intracranial hemorrhage or mass lesion. There are vague areas of lucency in the basal ganglia bilaterally consistent with white matter infarcts, age indeterminate.  There is diffuse slight cerebral cortical and cerebellar atrophy.  No acute osseous abnormality.  Tiny osteoma in the right side of the frontal sinus.  IMPRESSION: Small white matter infarcts in both basal ganglia.  Atrophy. No hemorrhage.   Original Report Authenticated By: Francene Boyers, M.D.   Ct Cervical Spine Wo Contrast  09/08/2012   *RADIOLOGY REPORT*  Clinical Data: Altered level of consciousness and neck pain secondary to a fall today.  CT CERVICAL SPINE WITHOUT CONTRAST  Technique:  Multidetector CT imaging of the cervical spine was performed. Multiplanar CT image reconstructions were also generated.  Comparison: CT scan dated 01/03/2011  Findings: There is no fracture or prevertebral soft tissue swelling.  There is a 3 mm spondylolisthesis of C3 on C4.  This is due to facet arthritis at C3-4. 1.3 mm subluxation at C4-5 is new since the prior exam.  There is also moderate left facet arthritis at C2-3 and there is degenerative disc disease at C6-7 with disc space narrowing and sclerosis and erosions of the endplates, unchanged.  IMPRESSION: No significant abnormalities.   Original Report Authenticated By: Francene Boyers, M.D.    EKG: Independently reviewed. Rapid atrial fib  Assessment/Plan Active Problems:   COPD   Chronic respiratory failure   Sepsis   Healthcare-associated pneumonia   Rapid atrial fibrillation   Dehydration   Hypokalemia   Chronic anticoagulation   1. HCAP/aspiration pneumonia. Patient was recently in the hospital which was risk  for hospital-acquired infections. With her severe vomiting and retching, she may also have some aspiration. We will start her on antibiotic coverage of vancomycin and Zosyn. Continue to monitor her pulmonary status closely. 2. Sepsis. Due to #1. We'll check blood cultures, aggressively hydrate with IV fluids and monitored in the step down unit. She is on broad-spectrum antibiotics as above. 3. Atrial fibrillation with rapid ventricular  response. Likely reactive to underlying dehydration/hypotension as well as fever. We'll need to give IV fluids. Currently her heart rate appears to be improving the low 100s. Restart rate control medications as blood pressure tolerates. She is anticoagulated with Coumadin. 4. Chronic respiratory failure due to COPD. Appears to be at baseline at this time. Continue nebulizer treatments and pulmonary hygiene. 5. Hypokalemia. Replete  Code Status: Full code.  Discussed at length with patient and her daughter.  She has deferred decisions to her daughter, who has requested that patient receive all available treatment. Family Communication: discussed with patient and daughter at the bedside Disposition Plan: pending hospital course  Time spent:  [employer] [facility] Pager [phone]  If 7PM-7AM, please contact night-coverage [email or web address] Password [facility] 09/08/2012, 5:38 PM

## 2012-09-08 NOTE — Progress Notes (Signed)
ANTICOAGULATION CONSULT NOTE - Initial Consult  Pharmacy Consult for Warfarin Indication: atrial fibrillation  Allergies  Allergen Reactions  . Cardizem (Diltiazem Hcl) Itching and Rash  . Sulfonamide Derivatives Rash    Patient Measurements:   Vital Signs: Temp: 98 F (36.7 C) (06/18 1837) Temp src: Oral (06/18 1837) BP: 99/51 mmHg (06/18 1837) Pulse Rate: 105 (06/18 1837)  Labs:  Recent Labs  09/08/12 1450 09/08/12 1532  HGB 12.0  --   HCT 37.6  --   PLT 228  --   LABPROT  --  21.2*  INR  --  1.92*  CREATININE 0.64  --   TROPONINI <0.30  --     The CrCl is unknown because both a height and weight (above a minimum accepted value) are required for this calculation.   Medical History: Past Medical History  Diagnosis Date  . Osteoporosis   . Diverticulosis   . Renal cyst   . Hyperthyroidism   . C. difficile colitis     12/06  . Lumbar and sacral arthritis   . COPD (chronic obstructive pulmonary disease)     PFT 10/05/08 - FEV1 0.89(43%), FVC 1.84(62%), FEV1% 49, TLC 5.64(120%), DLCO 38%  . Hypoxemia     Nocturnal oxygen  . Atrial fibrillation     Paroxysmal  . Sick sinus syndrome     Medtr[employer] PPM 2004  . Pulmonary nodule   . Pneumonia due to Haemophilus influenzae 03/2011    with positive blood cultures as well    Medications:  Infusions:  . 0.9 % NaCl with KCl 20 mEq / L    . piperacillin-tazobactam (ZOSYN)  IV    . vancomycin      Assessment: Continuation of Warfarin from home. PTA Warfarin 5 mg on Mon, Wed, Fri, and 2.5 mg Sun,Tues,Thur,Sat INR 1.92 on admission  Goal of Therapy:  INR 2-3 Monitor platelets by anticoagulation protocol: Yes   Plan: Warfarin 5 mg po tonight INR/PT daily CBC, monitor platelets Labs per protocol  Josephine Igo 09/08/2012,7:07 PM

## 2012-09-08 NOTE — ED Notes (Signed)
Pt reports getting up to walk and fell to the floor.  Pt denies hitting her head.  Pt reports neck and back pain.  Pt reports " it just felt like my legs gave out".  Pt denies any LOC, dizziness.

## 2012-09-08 NOTE — ED Provider Notes (Signed)
History     CSN: [id number]  Arrival date & time 09/08/12  1116   First MD Initiated Contact with Patient 09/08/12 1123      Chief Complaint  Patient presents with  . Fall    (Consider location/radiation/quality/duration/timing/severity/associated sxs/prior treatment) HPI Comments: Patient is a 77 year old female who presents to the emergency department by EMS after having had a near-syncope/syncope episode this morning. The history is provided by the patient and the patient's daughter. The patient's daughter states that she was in another room when she heard A. and. She went back to check on her mother and found her mother in the floor. The patient states that she was getting up to walk to the bathroom when she felt like her leg legs gave out and then everything went black for a second she then found herself on the floor. The patient states she remembers most of what happened. But states that she felt as though she could not get up initially after having fallen. The patient denies hitting her head, but complains of pain of the neck, back, and right hip area. It is of note that the patient is on Coumadin daily.  It is of note that the patient was admitted to the [facility] hospital approximately 2 weeks ago for a urinary tract infection. She received IV antibiotics. The patient and the patient's daughter state that the patient has been weak since that time. The patient's daughter states that the intake has been limited and that her usual energy level has been below par. The patient's temperature has not been checked recently, but the family states that the patient felt warm to touch. Patient presents now for evaluation of the fall as well as the neck back and hip area pain.  The history is provided by the patient and a relative.    Past Medical History  Diagnosis Date  . Osteoporosis   . Diverticulosis   . Renal cyst   . Hyperthyroidism   . C. difficile colitis     12/06  . Lumbar and  sacral arthritis   . COPD (chronic obstructive pulmonary disease)     PFT 10/05/08 - FEV1 0.89(43%), FVC 1.84(62%), FEV1% 49, TLC 5.64(120%), DLCO 38%  . Hypoxemia     Nocturnal oxygen  . Atrial fibrillation     Paroxysmal  . Sick sinus syndrome     Medtronic PPM 2004  . Pulmonary nodule   . Pneumonia due to Haemophilus influenzae 03/2011    with positive blood cultures as well    Past Surgical History  Procedure Laterality Date  . Total hip arthroplasty      Right  . Insert / replace / remove pacemaker      Medtronic  . Abdominal hysterectomy    . Bronchoscopy with washings  03/2011    mucus in right lower lobe with obstruction and some bleeding.  chroinc bronchitis, lung collapse,  Brushings showed numerous acute inflammatory cells and degenerated materal .  . Esophagogastroduodenoscopy  05/23/2011    WUJ:WJXB distal esophageal erosions consistent with mild  reflux esophagitis/otherwise normal  . Colonoscopy  05/23/2011    JYN:WGNFAOZH colorectal polyps-treated/large rectal polyp likely corresponds to the area of abnormal PET activity    Family History  Problem Relation Age of Onset  . Lung cancer Mother     ???patient states she had cancer but not sure what type  . Heart disease Brother   . Colon cancer Neg Hx     History  Substance Use  Topics  . Smoking status: Former Smoker -- 1.00 packs/day for 20 years    Types: Cigarettes  . Smokeless tobacco: Never Used  . Alcohol Use: No    OB History   Grav Para Term Preterm Abortions TAB SAB Ect Mult Living                  Review of Systems  Constitutional: Negative for activity change.       All ROS Neg except as noted in HPI  HENT: Negative for nosebleeds and neck pain.   Eyes: Negative for photophobia and discharge.  Respiratory: Positive for shortness of breath. Negative for cough and wheezing.   Cardiovascular: Positive for palpitations. Negative for chest pain.  Gastrointestinal: Negative for abdominal pain  and blood in stool.  Genitourinary: Negative for dysuria, frequency and hematuria.  Musculoskeletal: Positive for back pain and arthralgias.  Skin: Negative.   Neurological: Negative for dizziness, seizures and speech difficulty.  Psychiatric/Behavioral: Negative for hallucinations and confusion.    Allergies  Cardizem and Sulfonamide derivatives  Home Medications   Current Outpatient Rx  Name  Route  Sig  Dispense  Refill  . ALPRAZolam (XANAX) 0.25 MG tablet   Oral   Take 0.25 mg by mouth at bedtime. For anxiety         . digoxin (LANOXIN) 0.125 MG tablet   Oral   Take 1 tablet (125 mcg total) by mouth daily.   90 tablet   3   . fluticasone-salmeterol (ADVAIR HFA) 115-21 MCG/ACT inhaler   Inhalation   Inhale 2 puffs into the lungs 2 (two) times daily.           . furosemide (LASIX) 40 MG tablet   Oral   Take 40 mg by mouth daily as needed. Fluid retention         . metaxalone (SKELAXIN) 800 MG tablet   Oral   Take 400 mg by mouth daily as needed for pain.         . metoprolol tartrate (LOPRESSOR) 25 MG tablet   Oral   Take 1 tablet (25 mg total) by mouth 2 (two) times daily.   180 tablet   3   . pantoprazole (PROTONIX) 40 MG tablet   Oral   Take 40 mg by mouth daily.         . raloxifene (EVISTA) 60 MG tablet   Oral   Take 60 mg by mouth daily.           Marland Kitchen tiotropium (SPIRIVA) 18 MCG inhalation capsule   Inhalation   Place 18 mcg into inhaler and inhale daily.           . verapamil (CALAN-SR) 240 MG CR tablet   Oral   Take 120-240 mg by mouth 2 (two) times daily. Take 240 mg (whole tablet) in the mornings and 120 mg (1/2 tablet) in the evenings         . warfarin (COUMADIN) 5 MG tablet   Oral   Take 2.5-5 mg by mouth daily. As directed by coumadin clinic 5 mg on Mon-Wed-Fri 2.5 mg on Sun- Tues- Thurs-Sat           BP 129/59  Pulse 142  Resp 21  SpO2 100%  Physical Exam  Nursing note and vitals reviewed. Constitutional: She  is oriented to person, place, and time. She appears well-developed and well-nourished. She is cooperative.  Non-toxic appearance. Cervical collar, nasal cannula and backboard in place.  HENT:  Head: Normocephalic.  Right Ear: Tympanic membrane and external ear normal.  Left Ear: Tympanic membrane and external ear normal.  No palpable hematoma of the scalp appreciated.  No trauma to the tongue or teeth.  Eyes: EOM and lids are normal. Pupils are equal, round, and reactive to light.  Neck: Normal range of motion. Neck supple. Carotid bruit is not present.  Cervical collar in place.  Cardiovascular: Regular rhythm, intact distal pulses and normal pulses.   Tachycardia. 119/min.  2/6 systolic murmur appreciated with occasional skipped beat noted. No rub appreciated.  Pulmonary/Chest: Breath sounds normal. No respiratory distress.  Coarse breath sound bilateral rhonchi present. There is symmetrical rise and fall of the chest. There is tenderness to the right mid rib area. No palpable deformity or hematoma appreciated.  Abdominal: Soft. Bowel sounds are normal. There is no tenderness. There is no guarding.  Musculoskeletal:  There is pain to palpation and attempted movement of the right hip area. There is a well-healed surgical scar from hip replacement surgery. There is crepitus noted with attempted movement of the left hip area. There is good range of motion of the upper extremities. The dorsalis pedis pulse are 1+ bilaterally.  Lymphadenopathy:       Head (right side): No submandibular adenopathy present.       Head (left side): No submandibular adenopathy present.    She has no cervical adenopathy.  Neurological: She is alert and oriented to person, place, and time. She has normal strength. No cranial nerve deficit or sensory deficit.  Skin: Skin is warm and dry. She is not diaphoretic.  Psychiatric: She has a normal mood and affect. Her speech is normal.    ED Course  Procedures (including  critical care time)  Labs Reviewed  URINE CULTURE  BASIC METABOLIC PANEL  CBC WITH DIFFERENTIAL  URINALYSIS, ROUTINE W REFLEX MICROSCOPIC  TROPONIN I  LACTIC ACID, PLASMA   Dg Ribs Unilateral W/chest Right  09/08/2012   *RADIOLOGY REPORT*  Clinical Data: Right posterior chest pain secondary to a fall this morning.  RIGHT RIBS AND CHEST - 3+ VIEW  Comparison: Chest x-ray dated 11/14/2011 and chest CT dated 04/09/2012  Findings: There is an old healed fracture of the anterior lateral aspect of the right eighth rib.  No acute rib fractures.  However, the patient does have a new patchy infiltrate in the right midzone superimposed on severe emphysematous disease.  Heart size and pulmonary vascularity is normal.  Dual lead pacer in place.  IMPRESSION:  1.  No acute rib fractures. 2.  New patchy infiltrate in the right midzone superimposed on severe emphysema.  The possibility of aspiration pneumonitis should be considered.   Original Report Authenticated By: Francene Boyers, M.D.   Dg Pelvis 1-2 Views  09/08/2012   *RADIOLOGY REPORT*  Clinical Data: Pelvis pain secondary to a fall this morning.  PELVIS - 1-2 VIEW  Comparison: Radiographs of 05/18/2007 and 04/07/2007  Findings: There is no acute fracture or dislocation.  Right total hip prosthesis in place.  Avascular necrosis of the left femoral head without collapse of the articular surface.  IMPRESSION: No acute abnormality.   Original Report Authenticated By: Francene Boyers, M.D.   Ct Head Wo Contrast  09/08/2012   *RADIOLOGY REPORT*  Clinical Data: Altered level of consciousness secondary to a fall today.  CT HEAD WITHOUT CONTRAST  Technique:  Contiguous axial images were obtained from the base of the skull through the vertex without contrast.  Comparison: None.  Findings:  There is no acute intracranial hemorrhage or mass lesion. There are vague areas of lucency in the basal ganglia bilaterally consistent with white matter infarcts, age indeterminate.   There is diffuse slight cerebral cortical and cerebellar atrophy.  No acute osseous abnormality.  Tiny osteoma in the right side of the frontal sinus.  IMPRESSION: Small white matter infarcts in both basal ganglia.  Atrophy. No hemorrhage.   Original Report Authenticated By: Francene Boyers, M.D.   Ct Cervical Spine Wo Contrast  09/08/2012   *RADIOLOGY REPORT*  Clinical Data: Altered level of consciousness and neck pain secondary to a fall today.  CT CERVICAL SPINE WITHOUT CONTRAST  Technique:  Multidetector CT imaging of the cervical spine was performed. Multiplanar CT image reconstructions were also generated.  Comparison: CT scan dated 01/03/2011  Findings: There is no fracture or prevertebral soft tissue swelling.  There is a 3 mm spondylolisthesis of C3 on C4.  This is due to facet arthritis at C3-4. 1.3 mm subluxation at C4-5 is new since the prior exam.  There is also moderate left facet arthritis at C2-3 and there is degenerative disc disease at C6-7 with disc space narrowing and sclerosis and erosions of the endplates, unchanged.  IMPRESSION: No significant abnormalities.   Original Report Authenticated By: Francene Boyers, M.D.     No diagnosis found.    MDM  **I have reviewed nursing notes, vital signs, and all appropriate lab and imaging results for this patient.* Patient removed from the long backboard by me. Further examination of the lower back is negative for palpable step off.  The patient's daughter arrived shortly after the patient returned from the x-ray department. Additional history was obtained from the daughter.  The basic metabolic panel reveals potassium to be slightly low at 3.2 , calcium low 8.1. The remainder of the basic metabolic panel is within normal limits. The complete blood count shows a white blood cell count elevation of 21,000, the hemoglobin and hematocrit are normal at 12.0 and 37.6 respectively. The platelet count is normal at 228,000. The neutrophils are  elevated at 89. There is no documented shift to the left. The troponin is normal at less than 0.30. The lactic acid is within normal limits at 1.  Right ribs and chest x-ray reveals no acute rib fracture however it does show a new patchy infiltrate in the right midzone superimposed on severe emphysema. Question of aspiration pneumonitis was entertained. The pelvis x-ray shows avascular necrosis of the left femoral head without collapse, there is a right total hip prosthesis present on the right. No acute fracture appreciated. CT scan of the head shows small white matter infarcts in both basal ganglia but no acute hemorrhage and no skull fracture. CT scan of the cervical spine reveals multiple areas of spondylolisthesis and arthritis, but no fracture or subluxation.  The findings have been discussed with the patient and the daughter. It is recommended that the patient be admitted to the hospital for pneumonia and near-syncope with a fall. Case discussed with the hospitalist and       Kathie Dike, Cordelia Poche 09/20/12 2114

## 2012-09-09 DIAGNOSIS — J449 Chronic obstructive pulmonary disease, unspecified: Secondary | ICD-10-CM

## 2012-09-09 LAB — COMPREHENSIVE METABOLIC PANEL
Albumin: 2.1 g/dL — ABNORMAL LOW (ref 3.5–5.2)
BUN: 8 mg/dL (ref 6–23)
Creatinine, Ser: 0.59 mg/dL (ref 0.50–1.10)
Potassium: 3.6 mEq/L (ref 3.5–5.1)
Sodium: 139 mEq/L (ref 135–145)
Total Protein: 4.9 g/dL — ABNORMAL LOW (ref 6.0–8.3)

## 2012-09-09 LAB — URINE CULTURE

## 2012-09-09 LAB — CBC
HCT: 32.5 % — ABNORMAL LOW (ref 36.0–46.0)
Hemoglobin: 10.6 g/dL — ABNORMAL LOW (ref 12.0–15.0)
MCV: 90.5 fL (ref 78.0–100.0)
RBC: 3.59 MIL/uL — ABNORMAL LOW (ref 3.87–5.11)
WBC: 14 10*3/uL — ABNORMAL HIGH (ref 4.0–10.5)

## 2012-09-09 LAB — PROTIME-INR
INR: 2.24 — ABNORMAL HIGH (ref 0.00–1.49)
Prothrombin Time: 23.8 seconds — ABNORMAL HIGH (ref 11.6–15.2)

## 2012-09-09 MED ORDER — WARFARIN SODIUM 2.5 MG PO TABS
2.5000 mg | ORAL_TABLET | Freq: Once | ORAL | Status: AC
  Administered 2012-09-09: 2.5 mg via ORAL
  Filled 2012-09-09: qty 1

## 2012-09-09 MED ORDER — ALPRAZOLAM 0.25 MG PO TABS
0.2500 mg | ORAL_TABLET | Freq: Three times a day (TID) | ORAL | Status: DC | PRN
  Administered 2012-09-10 – 2012-09-14 (×5): 0.25 mg via ORAL
  Filled 2012-09-09 (×6): qty 1

## 2012-09-09 MED ORDER — WARFARIN - PHARMACIST DOSING INPATIENT
Status: DC
  Administered 2012-09-13 – 2012-09-14 (×2)

## 2012-09-09 MED ORDER — PRO-STAT SUGAR FREE PO LIQD
30.0000 mL | Freq: Three times a day (TID) | ORAL | Status: DC
  Administered 2012-09-09 – 2012-09-13 (×9): 30 mL via ORAL
  Filled 2012-09-09 (×10): qty 30

## 2012-09-09 MED ORDER — ENSURE COMPLETE PO LIQD
237.0000 mL | Freq: Every day | ORAL | Status: DC
  Administered 2012-09-09 – 2012-09-14 (×6): 237 mL via ORAL

## 2012-09-09 MED ORDER — VERAPAMIL HCL ER 240 MG PO TBCR
120.0000 mg | EXTENDED_RELEASE_TABLET | Freq: Every day | ORAL | Status: DC
  Administered 2012-09-09: 120 mg via ORAL
  Filled 2012-09-09 (×2): qty 1

## 2012-09-09 NOTE — Progress Notes (Signed)
INITIAL NUTRITION ASSESSMENT  DOCUMENTATION CODES Per approved criteria  -Severe  malnutrition in the context of social or environmental circumstances and acute illness   INTERVENTION:  Ensure Complete po BID, each supplement provides 350 kcal and 13 grams of protein.  ProStat 30 ml q HS (100 kcal, 15 gr protein)  Consult to CSW for possible nutrition/socialization community resources  NUTRITION DIAGNOSIS: Inadequate oral intake related to weekly meal pattern, and decreased appetite, current pneumonia  as evidenced by pt diet hx reports she doesn't like to eat alone therefore her main meal each day is dinner, and for 2 weeks prior to admission she's had increased weakness and decreased appetite (energy intake <75% for > 7d), severe muscle wasting (quadricep, knee, and triceps/biceps).  Goal: Pt to meet >/= 90% of their estimated nutrition needs  Monitor:  Po intake, labs and wt trends  Reason for Assessment:  Consult to assess nutrition status, Malnutrition Screen Score= 2  77 y.o. female  Admitting Dx includes: sepsis due to pneumonia  ASSESSMENT: Pt very pleasant lady who lives with one of her children. Daily meal pattern- no breakfast, applesauce or some type fruit for lunch and evening meal usually protein and vegetables. Drinks water and one Ensure daily. Reports she doesn't eat well during the day because she doesn't want to eat alone. Lately over past couple of weeks she hasn't been hungry likely related current acute illness. Discussed with her the importance of nutrition intake throughout the day and option of increasing her intake of Ensure while family is away to meet nutrition needs while family is away. Her weight history is stable but nutrition intake inadequate based on her diet hx. Severe malnutrition  Will refer to CSW for possible community options to increase her socialization opportunities and improve her overall nutrition intake.  Height: Ht Readings from Last  1 Encounters:  09/08/12 5\' 5"  (1.651 m)    Weight: Wt Readings from Last 1 Encounters:  09/09/12 128 lb 8.5 oz (58.3 kg)    Ideal Body Weight: 125# (56.8 kg)  % Ideal Body Weight: 103%  Wt Readings from Last 10 Encounters:  09/09/12 128 lb 8.5 oz (58.3 kg)  07/26/12 127 lb (57.607 kg)  01/05/12 131 lb 6.4 oz (59.603 kg)  11/12/11 130 lb (58.968 kg)  08/06/11 126 lb (57.153 kg)  05/12/11 123 lb 6.4 oz (55.974 kg)  05/09/11 123 lb (55.792 kg)  09/30/10 112 lb (50.803 kg)  06/25/10 111 lb (50.349 kg)  05/28/10 113 lb (51.256 kg)    Usual Body Weight: 125-130# (over past year)  % Usual Body Weight: 100%  BMI:  Body mass index is 21.39 kg/(m^2).normal range  Estimated Nutritional Needs: Kcal: 1450-1740 Protein: 70-80 gr Fluid: >1700 ml/day  Skin: No issues noted  Diet Order: Cardiac  EDUCATION NEEDS: -Education needs addressed   Intake/Output Summary (Last 24 hours) at 09/09/12 1400 Last data filed at 09/09/12 1300  Gross per 24 hour  Intake 2757.5 ml  Output   1050 ml  Net 1707.5 ml    Last BM: 09/07/12  Labs:   Recent Labs Lab 09/08/12 1450 09/09/12 0457  NA 137 139  K 3.2* 3.6  CL 97 101  CO2 32 31  BUN 9 8  CREATININE 0.64 0.59  CALCIUM 8.1* 8.0*  GLUCOSE 98 95    CBG (last 3)  No results found for this basename: GLUCAP,  in the last 72 hours  Scheduled Meds: . digoxin  125 mcg Oral Daily  . guaiFENesin  600 mg Oral BID  . ipratropium  0.5 mg Nebulization Q6H  . levalbuterol  0.63 mg Nebulization Q6H  . mometasone-formoterol  2 puff Inhalation BID  . pantoprazole  40 mg Oral Daily  . piperacillin-tazobactam (ZOSYN)  IV  3.375 g Intravenous Q8H  . vancomycin  500 mg Intravenous Q12H  . verapamil  120 mg Oral Daily  . warfarin  2.5 mg Oral Once  . [START ON 09/10/2012] Warfarin - Pharmacist Dosing Inpatient   Does not apply Q24H    Continuous Infusions: . 0.9 % NaCl with KCl 20 mEq / L 50 mL/hr at 09/09/12 1300    Past Medical  History  Diagnosis Date  . Osteoporosis   . Diverticulosis   . Renal cyst   . Hyperthyroidism   . C. difficile colitis     12/06  . Lumbar and sacral arthritis   . COPD (chronic obstructive pulmonary disease)     PFT 10/05/08 - FEV1 0.89(43%), FVC 1.84(62%), FEV1% 49, TLC 5.64(120%), DLCO 38%  . Hypoxemia     Nocturnal oxygen  . Atrial fibrillation     Paroxysmal  . Sick sinus syndrome     Medtronic PPM 2004  . Pulmonary nodule   . Pneumonia due to Haemophilus influenzae 03/2011    with positive blood cultures as well    Past Surgical History  Procedure Laterality Date  . Total hip arthroplasty      Right  . Insert / replace / remove pacemaker      Medtronic  . Abdominal hysterectomy    . Bronchoscopy with washings  03/2011    mucus in right lower lobe with obstruction and some bleeding.  chroinc bronchitis, lung collapse,  Brushings showed numerous acute inflammatory cells and degenerated materal .  . Esophagogastroduodenoscopy  05/23/2011    ZOX:WRUE distal esophageal erosions consistent with mild  reflux esophagitis/otherwise normal  . Colonoscopy  05/23/2011    AVW:[id number] colorectal polyps-treated/large rectal polyp likely corresponds to the area of abnormal PET activity    Royann Shivers MS,RD,LDN,CSG Office: #[phone] Pager: [phone]

## 2012-09-09 NOTE — Progress Notes (Signed)
PT Cancellation Note  Patient Details Name: [name] MRN: [id number] DOB: 17-Dec-1935   Cancelled Treatment:    Reason Eval/Treat Not Completed: Fatigue/lethargy limiting ability to participate Pt unable to work with me this afternoon due to severe fatigue, malaise.  We will try again tomorrow.  Myrlene Broker L 09/09/2012, 1:37 PM

## 2012-09-09 NOTE — Progress Notes (Signed)
ANTICOAGULATION CONSULT NOTE  Pharmacy Consult for Warfarin Indication: atrial fibrillation  Allergies  Allergen Reactions  . Cardizem (Diltiazem Hcl) Itching and Rash  . Sulfonamide Derivatives Rash    Patient Measurements: Height: 5\' 5"  (165.1 cm) Weight: 128 lb 8.5 oz (58.3 kg) IBW/kg (Calculated) : 57 Vital Signs: Temp: 99.1 F (37.3 C) (06/19 0400) Temp src: Oral (06/19 0400) BP: 108/64 mmHg (06/19 0800) Pulse Rate: 118 (06/19 0800)  Labs:  Recent Labs  09/08/12 1450 09/08/12 1532 09/09/12 0457  HGB 12.0  --  10.6*  HCT 37.6  --  32.5*  PLT 228  --  207  LABPROT  --  21.2* 23.8*  INR  --  1.92* 2.24*  CREATININE 0.64  --  0.59  TROPONINI <0.30  --   --    Estimated Creatinine Clearance: 55.5 ml/min (by C-G formula based on Cr of 0.59).  Medical History: Past Medical History  Diagnosis Date  . Osteoporosis   . Diverticulosis   . Renal cyst   . Hyperthyroidism   . C. difficile colitis     12/06  . Lumbar and sacral arthritis   . COPD (chronic obstructive pulmonary disease)     PFT 10/05/08 - FEV1 0.89(43%), FVC 1.84(62%), FEV1% 49, TLC 5.64(120%), DLCO 38%  . Hypoxemia     Nocturnal oxygen  . Atrial fibrillation     Paroxysmal  . Sick sinus syndrome     Medtronic PPM 2004  . Pulmonary nodule   . Pneumonia due to Haemophilus influenzae 03/2011    with positive blood cultures as well    Medications:  Medications Prior to Admission  Medication Sig Dispense Refill  . ALPRAZolam (XANAX) 0.25 MG tablet Take 0.25 mg by mouth at bedtime. For anxiety      . digoxin (LANOXIN) 0.125 MG tablet Take 1 tablet (125 mcg total) by mouth daily.  90 tablet  3  . fluticasone-salmeterol (ADVAIR HFA) 115-21 MCG/ACT inhaler Inhale 2 puffs into the lungs 2 (two) times daily.        . furosemide (LASIX) 40 MG tablet Take 40 mg by mouth daily as needed. Fluid retention      . metaxalone (SKELAXIN) 800 MG tablet Take 400 mg by mouth daily as needed for pain.      .  metoprolol tartrate (LOPRESSOR) 25 MG tablet Take 1 tablet (25 mg total) by mouth 2 (two) times daily.  180 tablet  3  . pantoprazole (PROTONIX) 40 MG tablet Take 40 mg by mouth daily.      . raloxifene (EVISTA) 60 MG tablet Take 60 mg by mouth daily.        Marland Kitchen tiotropium (SPIRIVA) 18 MCG inhalation capsule Place 18 mcg into inhaler and inhale daily.        . verapamil (CALAN-SR) 240 MG CR tablet Take 120-240 mg by mouth 2 (two) times daily. Take 240 mg (whole tablet) in the mornings and 120 mg (1/2 tablet) in the evenings      . warfarin (COUMADIN) 5 MG tablet Take 2.5-5 mg by mouth daily. As directed by coumadin clinic 5 mg on Mon-Wed-Fri 2.5 mg on Sun- Tues- Thurs-Sat       Assessment: Continuation of Warfarin from home. PTA Warfarin 5 mg on Mon, Wed, Fri, and 2.5 mg Sun,Tues,Thur,Sat  Goal of Therapy:  INR 2-3   Plan: Warfarin 2.5 mg po today. INR/PT daily Labs per protocol  Mady Gemma 09/09/2012,9:08 AM

## 2012-09-09 NOTE — Progress Notes (Signed)
UR Chart Review Completed  

## 2012-09-09 NOTE — Care Management Note (Signed)
    Page 1 of 2   09/15/2012     10:32:12 AM   CARE MANAGEMENT NOTE 09/15/2012  Patient:  MARIYANA, FULOP   Account Number:  [id number]  Date Initiated:  09/09/2012  Documentation initiated by:  Anibal Henderson  Subjective/Objective Assessment:   admitted with COPD, PNA and possible sepsis. Pt is from home, lives with daughter and son-in-law. She is usually fairly independent, except is on O2 at home. She has had HH before and uses [facility]     Action/Plan:   may need HH to follow for disease management- will follow   Anticipated DC Date:  09/12/2012   Anticipated DC Plan:  HOME W HOME HEALTH SERVICES      DC Planning Services  CM consult      [facility] Choice  HOME HEALTH   Choice offered to / List presented to:  C-1 Patient        HH arranged  HH-1 RN  HH-10 DISEASE MANAGEMENT  HH-2 PT      HH agency  [facility]   Status of service:  Completed, signed off Medicare Important Message given?  YES (If response is "NO", the following Medicare IM given date fields will be blank) Date Medicare IM given:  09/15/2012 Date Additional Medicare IM given:    Discharge Disposition:  HOME W HOME HEALTH SERVICES  Per UR Regulation:  Reviewed for med. necessity/level of care/duration of stay  If discussed at Long Length of Stay Meetings, dates discussed:   09/14/2012    Comments:  09/15/12 1030 Mack Thurmon Amie Critchley, RN BSN CM Pt discharged home today with [facility] RN and PT. Alroy Bailiff of [facility] is aware and will collect the pts information from the chart. Pt already has home O2 and walker. No DME needs noted. HH services to start within 48 hours. Pt and pts nurse aware of discharge arrangements.  09/09/12 1000 Anibal Henderson RN/CM

## 2012-09-09 NOTE — Progress Notes (Signed)
[facility] PROGRESS NOTE  [name] ZOX:[id number] DOB: 10-11-1935 DOA: 09/08/2012 PCP: Ignatius Specking., MD  Assessment/Plan: 1. Sepsis due to pneumonia. Blood pressure and heart rate are improving. She's been afebrile. Leukocytosis is also improving. Continue current treatments. 2. HCAP/aspiration pneumonia. Continue broad-spectrum antibiotics and pulmonary hygiene.  3. Chronic respiratory failure due to COPD. Patient is on 2.5 L of oxygen at home. Continue current treatments/nebulizer treatments. 4. Atrial for relation with ventricular response. Blood pressure is better. Heart rate still remains elevated. We'll start low-dose verapamil. She is anticoagulated on Coumadin. 5. Severe protein calorie malnutrition. Nutrition consult  Code Status: Full code Family Communication: Discussed with patient Disposition Plan: To be determined   Consultants:  None  Procedures:  None  Antibiotics:  Vancomycin 6/18  Zosyn 6/18  HPI/Subjective: Continues to feel very weak. Has been coughing with production of white and brown sputum.   Objective: Filed Vitals:   09/09/12 0600 09/09/12 0700 09/09/12 0724 09/09/12 0800  BP:    108/64  Pulse: 98 99  118  Temp:    98.8 F (37.1 C)  TempSrc:    Oral  Resp: 29 29  28   Height:      Weight:      SpO2: 92% 92% 92% 90%    Intake/Output Summary (Last 24 hours) at 09/09/12 0927 Last data filed at 09/09/12 0400  Gross per 24 hour  Intake   1425 ml  Output    900 ml  Net    525 ml   Filed Weights   09/08/12 1906 09/09/12 0500  Weight: 58.5 kg (128 lb 15.5 oz) 58.3 kg (128 lb 8.5 oz)    Exam:   General:  No acute distress  Cardiovascular: S1, S2, irregular  Respiratory: Diminished breath sounds but clear bilaterally  Abdomen: Soft, nontender, nondistended, positive bowel sounds  Musculoskeletal: No pedal edema bilaterally   Data Reviewed: Basic Metabolic Panel:  Recent Labs Lab 09/08/12 1450  09/09/12 0457  NA 137 139  K 3.2* 3.6  CL 97 101  CO2 32 31  GLUCOSE 98 95  BUN 9 8  CREATININE 0.64 0.59  CALCIUM 8.1* 8.0*   Liver Function Tests:  Recent Labs Lab 09/09/12 0457  AST 14  ALT 6  ALKPHOS 88  BILITOT 1.2  PROT 4.9*  ALBUMIN 2.1*   No results found for this basename: LIPASE, AMYLASE,  in the last 168 hours No results found for this basename: AMMONIA,  in the last 168 hours CBC:  Recent Labs Lab 09/08/12 1450 09/09/12 0457  WBC 21.0* 14.0*  NEUTROABS 18.6*  --   HGB 12.0 10.6*  HCT 37.6 32.5*  MCV 90.0 90.5  PLT 228 207   Cardiac Enzymes:  Recent Labs Lab 09/08/12 1450  TROPONINI <0.30   BNP (last 3 results) No results found for this basename: PROBNP,  in the last 8760 hours CBG: No results found for this basename: GLUCAP,  in the last 168 hours  Recent Results (from the past 240 hour(s))  MRSA PCR SCREENING     Status: None   Collection Time    09/08/12  7:15 PM      Result Value Range Status   MRSA by PCR NEGATIVE  NEGATIVE Final   Comment:            The GeneXpert MRSA Assay (FDA     approved for NASAL specimens     only), is one component of a     comprehensive MRSA colonization  surveillance program. It is not     intended to diagnose MRSA     infection nor to guide or     monitor treatment for     MRSA infections.     Studies: Dg Ribs Unilateral W/chest Right  09/08/2012   *RADIOLOGY REPORT*  Clinical Data: Right posterior chest pain secondary to a fall this morning.  RIGHT RIBS AND CHEST - 3+ VIEW  Comparison: Chest x-ray dated 11/14/2011 and chest CT dated 04/09/2012  Findings: There is an old healed fracture of the anterior lateral aspect of the right eighth rib.  No acute rib fractures.  However, the patient does have a new patchy infiltrate in the right midzone superimposed on severe emphysematous disease.  Heart size and pulmonary vascularity is normal.  Dual lead pacer in place.  IMPRESSION:  1.  No acute rib  fractures. 2.  New patchy infiltrate in the right midzone superimposed on severe emphysema.  The possibility of aspiration pneumonitis should be considered.   Original Report Authenticated By: Francene Boyers, M.D.   Dg Pelvis 1-2 Views  09/08/2012   *RADIOLOGY REPORT*  Clinical Data: Pelvis pain secondary to a fall this morning.  PELVIS - 1-2 VIEW  Comparison: Radiographs of 05/18/2007 and 04/07/2007  Findings: There is no acute fracture or dislocation.  Right total hip prosthesis in place.  Avascular necrosis of the left femoral head without collapse of the articular surface.  IMPRESSION: No acute abnormality.   Original Report Authenticated By: Francene Boyers, M.D.   Ct Head Wo Contrast  09/08/2012   *RADIOLOGY REPORT*  Clinical Data: Altered level of consciousness secondary to a fall today.  CT HEAD WITHOUT CONTRAST  Technique:  Contiguous axial images were obtained from the base of the skull through the vertex without contrast.  Comparison: None.  Findings: There is no acute intracranial hemorrhage or mass lesion. There are vague areas of lucency in the basal ganglia bilaterally consistent with white matter infarcts, age indeterminate.  There is diffuse slight cerebral cortical and cerebellar atrophy.  No acute osseous abnormality.  Tiny osteoma in the right side of the frontal sinus.  IMPRESSION: Small white matter infarcts in both basal ganglia.  Atrophy. No hemorrhage.   Original Report Authenticated By: Francene Boyers, M.D.   Ct Cervical Spine Wo Contrast  09/08/2012   *RADIOLOGY REPORT*  Clinical Data: Altered level of consciousness and neck pain secondary to a fall today.  CT CERVICAL SPINE WITHOUT CONTRAST  Technique:  Multidetector CT imaging of the cervical spine was performed. Multiplanar CT image reconstructions were also generated.  Comparison: CT scan dated 01/03/2011  Findings: There is no fracture or prevertebral soft tissue swelling.  There is a 3 mm spondylolisthesis of C3 on C4.  This is  due to facet arthritis at C3-4. 1.3 mm subluxation at C4-5 is new since the prior exam.  There is also moderate left facet arthritis at C2-3 and there is degenerative disc disease at C6-7 with disc space narrowing and sclerosis and erosions of the endplates, unchanged.  IMPRESSION: No significant abnormalities.   Original Report Authenticated By: Francene Boyers, M.D.    Scheduled Meds: . ALPRAZolam  0.25 mg Oral QHS  . digoxin  125 mcg Oral Daily  . guaiFENesin  600 mg Oral BID  . ipratropium  0.5 mg Nebulization Q6H  . levalbuterol  0.63 mg Nebulization Q6H  . mometasone-formoterol  2 puff Inhalation BID  . pantoprazole  40 mg Oral Daily  . piperacillin-tazobactam (ZOSYN)  IV  3.375 g Intravenous Q8H  . vancomycin  500 mg Intravenous Q12H  . warfarin  2.5 mg Oral Once  . [START ON 09/10/2012] Warfarin - Pharmacist Dosing Inpatient   Does not apply Q24H   Continuous Infusions: . 0.9 % NaCl with KCl 20 mEq / L 125 mL/hr at 09/09/12 0400    Active Problems:   COPD   Chronic respiratory failure   Sepsis   Healthcare-associated pneumonia   Rapid atrial fibrillation   Dehydration   Hypokalemia   Chronic anticoagulation    Time spent:    Ala Kratz  [facility] Pager [phone]. If 7PM-7AM, please contact night-coverage at [email or web address], password [facility] 09/09/2012, 9:27 AM  LOS: 1 day

## 2012-09-10 ENCOUNTER — Inpatient Hospital Stay (HOSPITAL_COMMUNITY): Payer: Medicare Other

## 2012-09-10 DIAGNOSIS — E43 Unspecified severe protein-calorie malnutrition: Secondary | ICD-10-CM | POA: Diagnosis present

## 2012-09-10 LAB — CBC
HCT: 32.3 % — ABNORMAL LOW (ref 36.0–46.0)
MCH: 28.4 pg (ref 26.0–34.0)
MCV: 90.7 fL (ref 78.0–100.0)
Platelets: 204 10*3/uL (ref 150–400)
RDW: 15.7 % — ABNORMAL HIGH (ref 11.5–15.5)

## 2012-09-10 LAB — BASIC METABOLIC PANEL
BUN: 7 mg/dL (ref 6–23)
CO2: 32 mEq/L (ref 19–32)
Calcium: 8.5 mg/dL (ref 8.4–10.5)
Creatinine, Ser: 0.58 mg/dL (ref 0.50–1.10)
GFR calc Af Amer: >90 mL/min (ref >90)

## 2012-09-10 LAB — LEGIONELLA ANTIGEN, URINE

## 2012-09-10 LAB — LIPASE, BLOOD: Lipase: 9 U/L — ABNORMAL LOW (ref 11–59)

## 2012-09-10 MED ORDER — VERAPAMIL HCL ER 240 MG PO TBCR
120.0000 mg | EXTENDED_RELEASE_TABLET | Freq: Every day | ORAL | Status: DC
  Administered 2012-09-10 – 2012-09-14 (×6): 120 mg via ORAL
  Filled 2012-09-10 (×6): qty 1

## 2012-09-10 MED ORDER — VERAPAMIL HCL ER 120 MG PO TBCR
120.0000 mg | EXTENDED_RELEASE_TABLET | Freq: Once | ORAL | Status: DC
  Filled 2012-09-10: qty 1

## 2012-09-10 MED ORDER — VERAPAMIL HCL ER 240 MG PO TBCR
240.0000 mg | EXTENDED_RELEASE_TABLET | Freq: Every morning | ORAL | Status: DC
  Administered 2012-09-11 – 2012-09-15 (×5): 240 mg via ORAL
  Filled 2012-09-10 (×5): qty 1

## 2012-09-10 NOTE — Evaluation (Signed)
Physical Therapy Evaluation Patient Details Name: [name] MRN: [id number] DOB: [birth year] Today's Date: 09/10/2012 Time: 1120-1200 PT Time Calculation (min): 40 min  PT Assessment / Plan / Recommendation Clinical Impression  Pt is a very pleasant 77 yo admitted to [facility] for pneumonia.  Pt tends to breathe through her mouth.  Pt was counseled on why you need to breathe through your nose and to practice proper breathing 3x/day.  Pt able to complete exercises but needed three breaks to get through them.  Pt  O2 stat decreased to 87 upon sitting but recovered within one minute period.  Standing O2 decreased to 88 but recovered within one minue of time.  Pt only able to tolerate standing for one minute periods.  Will progress as able    PT Assessment  Patient needs continued PT services    Follow Up Recommendations  Home health PT    Does the patient have the potential to tolerate intense rehabilitation    no  Barriers to Discharge  none      Equipment Recommendations    none   Recommendations for Other Services   none  Frequency Min 3X/week    Precautions / Restrictions Precautions Precautions: Fall Restrictions Weight Bearing Restrictions: No   Pertinent Vitals/Pain 0/10      Mobility  Bed Mobility Bed Mobility: Rolling Right;Right Sidelying to Sit Rolling Right: 6: Modified independent (Device/Increase time) Right Sidelying to Sit: 4: Min assist Transfers Transfers: Sit to Stand;Stand Pivot Transfers Sit to Stand: 4: Min assist Ambulation/Gait Ambulation/Gait Assistance: 4: Min guard Assistive device: None Ambulation/Gait Assistance Details: No gt due to falling O2    Exercises General Exercises - Lower Extremity Ankle Circles/Pumps: AROM;Both;10 reps Gluteal Sets: 10 reps (Bridge ) Long Arc Quad: Both;10 reps Straight Leg Raises: Both;10 reps Heel Raises: Both;10 reps   PT Diagnosis: Difficulty walking;Generalized weakness  PT Problem List: Decreased  activity tolerance PT Treatment Interventions: Gait training;Functional mobility training;Therapeutic activities;Therapeutic exercise   PT Goals Acute Rehab PT Goals PT Goal Formulation: With patient Time For Goal Achievement: 09/14/12 Potential to Achieve Goals: Good Pt will Roll Supine to Right Side: Independently PT Goal: Rolling Supine to Right Side - Progress: Goal set today Pt will go Supine/Side to Sit: with modified independence PT Goal: Supine/Side to Sit - Progress: Goal set today Pt will go Sit to Stand: with modified independence PT Goal: Sit to Stand - Progress: Goal set today Pt will Transfer Bed to Chair/Chair to Bed: with modified independence PT Transfer Goal: Bed to Chair/Chair to Bed - Progress: Goal set today Pt will Stand: 3 - 5 min PT Goal: Stand - Progress: Goal set today Pt will Ambulate: 16 - 50 feet;with modified independence PT Goal: Ambulate - Progress: Goal set today  Visit Information  Last PT Received On: 09/10/12    Subjective Data  Subjective: Pt lives with daughter normally does not use assistive device but has cane and walker Patient Stated Goal: To get stronger   Prior Functioning  Home Living Lives With: Daughter Available Help at Discharge: Family Type of Home: House Home Access: Stairs to enter Entrance Stairs-Rails: Right Home Layout: One level Home Adaptive Equipment: Walker - rolling;Wheelchair - manual;Bedside commode/3-in-1;Straight cane;Crutches Prior Function Level of Independence: Independent Able to Take Stairs?: Yes Driving: No Vocation: Retired Musician: No difficulties    Cognition       Extremity/Trunk Assessment Right Lower Extremity Assessment RLE ROM/Strength/Tone: [facility] for tasks assessed Left Lower Extremity Assessment LLE ROM/Strength/Tone: [facility]  for tasks assessed   Balance    End of Session PT - End of Session Equipment Utilized During Treatment: Gait belt Patient left: in chair;with call  bell/phone within reach Nurse Communication: Mobility status  GP     Maansi Wike,CINDY 09/10/2012, 12:11 PM

## 2012-09-10 NOTE — Plan of Care (Signed)
Problem: Consults Goal: COPD Patient Education (See Patient Education Module for education specifics.)  Outcome: Progressing Patient is new to [facility], from [place].  Has been taught breathing tips for COPD and is using incentive spirometry.   Goal: Skin Care Protocol Initiated - if Braden Score 18 or less If consults are not indicated, leave blank or document N/A  Outcome: Progressing Patient has edema to right arm and redness to bilat arms.  Manual BP being taken to prevent further skin breakdown.   Goal: Case Management Consult (COPD) A. Determine the need for home health or [facility] care management. B. Determine PCP and / or establish PCP for patient if no PCP. C. Determine need and eligibility for Pulmonary Rehab and obtain referral. D. Coordinate home health services and PT/OT recommendations prior patient discharge.  Outcome: Progressing Considering SNF at discharge

## 2012-09-10 NOTE — Progress Notes (Signed)
ANTICOAGULATION CONSULT NOTE  Pharmacy Consult for Warfarin Indication: atrial fibrillation  Allergies  Allergen Reactions  . Cardizem (Diltiazem Hcl) Itching and Rash  . Sulfonamide Derivatives Rash    Patient Measurements: Height: 5\' 5"  (165.1 cm) Weight: 127 lb 13.9 oz (58 kg) IBW/kg (Calculated) : 57 Vital Signs: Temp: 98.7 F (37.1 C) (06/20 0400) Temp src: Oral (06/20 0400) BP: 118/68 mmHg (06/20 0400) Pulse Rate: 102 (06/20 0600)  Labs:  Recent Labs  09/08/12 1450 09/08/12 1532 09/09/12 0457 09/10/12 0431  HGB 12.0  --  10.6* 10.1*  HCT 37.6  --  32.5* 32.3*  PLT 228  --  207 204  LABPROT  --  21.2* 23.8* 34.6*  INR  --  1.92* 2.24* 3.71*  CREATININE 0.64  --  0.59 0.58  TROPONINI <0.30  --   --   --    Estimated Creatinine Clearance: 55.5 ml/min (by C-G formula based on Cr of 0.58).  Medical History: Past Medical History  Diagnosis Date  . Osteoporosis   . Diverticulosis   . Renal cyst   . Hyperthyroidism   . C. difficile colitis     12/06  . Lumbar and sacral arthritis   . COPD (chronic obstructive pulmonary disease)     PFT 10/05/08 - FEV1 0.89(43%), FVC 1.84(62%), FEV1% 49, TLC 5.64(120%), DLCO 38%  . Hypoxemia     Nocturnal oxygen  . Atrial fibrillation     Paroxysmal  . Sick sinus syndrome     [employer] PPM 2004  . Pulmonary nodule   . Pneumonia due to Haemophilus influenzae 03/2011    with positive blood cultures as well    Medications:  Medications Prior to Admission  Medication Sig Dispense Refill  . ALPRAZolam (XANAX) 0.25 MG tablet Take 0.25 mg by mouth at bedtime. For anxiety      . digoxin (LANOXIN) 0.125 MG tablet Take 1 tablet (125 mcg total) by mouth daily.  90 tablet  3  . fluticasone-salmeterol (ADVAIR HFA) 115-21 MCG/ACT inhaler Inhale 2 puffs into the lungs 2 (two) times daily.        . furosemide (LASIX) 40 MG tablet Take 40 mg by mouth daily as needed. Fluid retention      . metaxalone (SKELAXIN) 800 MG tablet Take 400  mg by mouth daily as needed for pain.      . metoprolol tartrate (LOPRESSOR) 25 MG tablet Take 1 tablet (25 mg total) by mouth 2 (two) times daily.  180 tablet  3  . pantoprazole (PROTONIX) 40 MG tablet Take 40 mg by mouth daily.      . raloxifene (EVISTA) 60 MG tablet Take 60 mg by mouth daily.        Marland Kitchen tiotropium (SPIRIVA) 18 MCG inhalation capsule Place 18 mcg into inhaler and inhale daily.        . verapamil (CALAN-SR) 240 MG CR tablet Take 120-240 mg by mouth 2 (two) times daily. Take 240 mg (whole tablet) in the mornings and 120 mg (1/2 tablet) in the evenings      . warfarin (COUMADIN) 5 MG tablet Take 2.5-5 mg by mouth daily. As directed by coumadin clinic 5 mg on Mon-Wed-Fri 2.5 mg on Sun- Tues- Thurs-Sat       Assessment: 77 yo F on chronic warfarin (2.5mg  daily except 5mg  on MWF) for Afib.  INR was therapeutic on admission, but has risen above goal range today.  This is likely due to acute illness.  No bleeding noted.  Goal of Therapy:  INR 2-3   Plan: Hold Warfarin today INR/PT daily  Kazim Corrales, [facility] 09/10/2012,11:52 AM

## 2012-09-10 NOTE — Progress Notes (Signed)
[facility] PROGRESS NOTE  [name] YNW:[id number] DOB: 12-23-1935 DOA: 09/08/2012 PCP: Ignatius Specking., MD  Assessment/Plan: 1. Sepsis due to pneumonia. Patient had low-grade fever yesterday. Blood pressure appears to be stable. Leukocytosis is resolved. We'll continue current antibiotic regimen. 2. HCAP/aspiration pneumonia. Continue broad-spectrum antibiotics and pulmonary hygiene. Check chest x-ray to track progression. 3. Chronic respiratory failure due to COPD. Patient is on 2.5 L of oxygen at home. Continue current treatments/nebulizer treatments. 4. Atrial fibrillation with rapid ventricular response. Blood pressure is better. Heart rate still remains elevated. Verapamil was started yesterday and this will be titrated up as tolerated. She is anticoagulated on Coumadin. 5. Severe protein calorie malnutrition. Nutrition consult 6. Nausea, dry heaves. Patient reports that she has persistent nausea and dry heaving. Her by mouth intake has been very poor. Symptoms are worse when she stands up from a sitting position. Abdomen was otherwise benign. LFTs are unremarkable. We'll check serum lipase. Check abdominal x-ray.  Code Status: Full code Family Communication: Discussed with patient Disposition Plan: To be determined   Consultants:  None  Procedures:  None  Antibiotics:  Vancomycin 6/18  Zosyn 6/18  HPI/Subjective: Feels very weak. Complaints of persistent nausea and dry heaves. By mouth intake has been poor. Breathing appears to be stable.   Objective: Filed Vitals:   09/10/12 0400 09/10/12 0500 09/10/12 0600 09/10/12 0808  BP: 118/68     Pulse: 107 98 102   Temp: 98.7 F (37.1 C)     TempSrc: Oral     Resp: 27 26 29    Height:      Weight:  58 kg (127 lb 13.9 oz)    SpO2: 94% 96% 95% 93%    Intake/Output Summary (Last 24 hours) at 09/10/12 1022 Last data filed at 09/10/12 0600  Gross per 24 hour  Intake   1400 ml  Output   1050 ml  Net    350  ml   Filed Weights   09/08/12 1906 09/09/12 0500 09/10/12 0500  Weight: 58.5 kg (128 lb 15.5 oz) 58.3 kg (128 lb 8.5 oz) 58 kg (127 lb 13.9 oz)    Exam:   General:  No acute distress, appears to be chronically ill  Cardiovascular: S1, S2, irregular, tachycardic  Respiratory: Diminished breath sounds but clear bilaterally  Abdomen: Soft, nontender, nondistended, positive bowel sounds  Musculoskeletal: No pedal edema bilaterally, right arm is swollen likely due to infiltration of IVs.  Data Reviewed: Basic Metabolic Panel:  Recent Labs Lab 09/08/12 1450 09/09/12 0457 09/10/12 0431  NA 137 139 139  K 3.2* 3.6 3.3*  CL 97 101 101  CO2 32 31 32  GLUCOSE 98 95 93  BUN 9 8 7   CREATININE 0.64 0.59 0.58  CALCIUM 8.1* 8.0* 8.5   Liver Function Tests:  Recent Labs Lab 09/09/12 0457  AST 14  ALT 6  ALKPHOS 88  BILITOT 1.2  PROT 4.9*  ALBUMIN 2.1*   No results found for this basename: LIPASE, AMYLASE,  in the last 168 hours No results found for this basename: AMMONIA,  in the last 168 hours CBC:  Recent Labs Lab 09/08/12 1450 09/09/12 0457 09/10/12 0431  WBC 21.0* 14.0* 8.4  NEUTROABS 18.6*  --   --   HGB 12.0 10.6* 10.1*  HCT 37.6 32.5* 32.3*  MCV 90.0 90.5 90.7  PLT 228 207 204   Cardiac Enzymes:  Recent Labs Lab 09/08/12 1450  TROPONINI <0.30   BNP (last 3 results) No results found  for this basename: PROBNP,  in the last 8760 hours CBG: No results found for this basename: GLUCAP,  in the last 168 hours  Recent Results (from the past 240 hour(s))  URINE CULTURE     Status: None   Collection Time    09/08/12  4:52 PM      Result Value Range Status   Specimen Description URINE, CATHETERIZED   Final   Special Requests NONE   Final   Culture  Setup Time 09/08/2012 17:52   Final   Colony Count NO GROWTH   Final   Culture NO GROWTH   Final   Report Status 09/09/2012 FINAL   Final  MRSA PCR SCREENING     Status: None   Collection Time     09/08/12  7:15 PM      Result Value Range Status   MRSA by PCR NEGATIVE  NEGATIVE Final   Comment:            The GeneXpert MRSA Assay (FDA     approved for NASAL specimens     only), is one component of a     comprehensive MRSA colonization     surveillance program. It is not     intended to diagnose MRSA     infection nor to guide or     monitor treatment for     MRSA infections.  CULTURE, BLOOD (ROUTINE X 2)     Status: None   Collection Time    09/08/12  7:57 PM      Result Value Range Status   Specimen Description BLOOD RIGHT HAND   Final   Special Requests BOTTLES DRAWN AEROBIC ONLY 4CC EACH   Final   Culture NO GROWTH 2 DAYS   Final   Report Status PENDING   Incomplete  CULTURE, BLOOD (ROUTINE X 2)     Status: None   Collection Time    09/08/12  8:09 PM      Result Value Range Status   Specimen Description BLOOD LEFT ANTECUBITAL   Final   Special Requests BOTTLES DRAWN AEROBIC AND ANAEROBIC 12CC EACH   Final   Culture NO GROWTH 2 DAYS   Final   Report Status PENDING   Incomplete     Studies: Dg Ribs Unilateral W/chest Right  09/08/2012   *RADIOLOGY REPORT*  Clinical Data: Right posterior chest pain secondary to a fall this morning.  RIGHT RIBS AND CHEST - 3+ VIEW  Comparison: Chest x-ray dated 11/14/2011 and chest CT dated 04/09/2012  Findings: There is an old healed fracture of the anterior lateral aspect of the right eighth rib.  No acute rib fractures.  However, the patient does have a new patchy infiltrate in the right midzone superimposed on severe emphysematous disease.  Heart size and pulmonary vascularity is normal.  Dual lead pacer in place.  IMPRESSION:  1.  No acute rib fractures. 2.  New patchy infiltrate in the right midzone superimposed on severe emphysema.  The possibility of aspiration pneumonitis should be considered.   Original Report Authenticated By: Francene Boyers, M.D.   Dg Pelvis 1-2 Views  09/08/2012   *RADIOLOGY REPORT*  Clinical Data: Pelvis pain  secondary to a fall this morning.  PELVIS - 1-2 VIEW  Comparison: Radiographs of 05/18/2007 and 04/07/2007  Findings: There is no acute fracture or dislocation.  Right total hip prosthesis in place.  Avascular necrosis of the left femoral head without collapse of the articular surface.  IMPRESSION: No acute abnormality.  Original Report Authenticated By: Francene Boyers, M.D.   Ct Head Wo Contrast  09/08/2012   *RADIOLOGY REPORT*  Clinical Data: Altered level of consciousness secondary to a fall today.  CT HEAD WITHOUT CONTRAST  Technique:  Contiguous axial images were obtained from the base of the skull through the vertex without contrast.  Comparison: None.  Findings: There is no acute intracranial hemorrhage or mass lesion. There are vague areas of lucency in the basal ganglia bilaterally consistent with white matter infarcts, age indeterminate.  There is diffuse slight cerebral cortical and cerebellar atrophy.  No acute osseous abnormality.  Tiny osteoma in the right side of the frontal sinus.  IMPRESSION: Small white matter infarcts in both basal ganglia.  Atrophy. No hemorrhage.   Original Report Authenticated By: Francene Boyers, M.D.   Ct Cervical Spine Wo Contrast  09/08/2012   *RADIOLOGY REPORT*  Clinical Data: Altered level of consciousness and neck pain secondary to a fall today.  CT CERVICAL SPINE WITHOUT CONTRAST  Technique:  Multidetector CT imaging of the cervical spine was performed. Multiplanar CT image reconstructions were also generated.  Comparison: CT scan dated 01/03/2011  Findings: There is no fracture or prevertebral soft tissue swelling.  There is a 3 mm spondylolisthesis of C3 on C4.  This is due to facet arthritis at C3-4. 1.3 mm subluxation at C4-5 is new since the prior exam.  There is also moderate left facet arthritis at C2-3 and there is degenerative disc disease at C6-7 with disc space narrowing and sclerosis and erosions of the endplates, unchanged.  IMPRESSION: No significant  abnormalities.   Original Report Authenticated By: Francene Boyers, M.D.    Scheduled Meds: . digoxin  125 mcg Oral Daily  . feeding supplement  237 mL Oral QPC supper  . feeding supplement  30 mL Oral TID WC  . guaiFENesin  600 mg Oral BID  . ipratropium  0.5 mg Nebulization Q6H  . levalbuterol  0.63 mg Nebulization Q6H  . mometasone-formoterol  2 puff Inhalation BID  . pantoprazole  40 mg Oral Daily  . piperacillin-tazobactam (ZOSYN)  IV  3.375 g Intravenous Q8H  . vancomycin  500 mg Intravenous Q12H  . verapamil  120 mg Oral Once  . verapamil  120-240 mg Oral BID  . Warfarin - Pharmacist Dosing Inpatient   Does not apply Q24H   Continuous Infusions: . 0.9 % NaCl with KCl 20 mEq / L 50 mL/hr at 09/10/12 0600    Active Problems:   COPD   Chronic respiratory failure   Sepsis   Healthcare-associated pneumonia   Rapid atrial fibrillation   Dehydration   Hypokalemia   Chronic anticoagulation   Protein-calorie malnutrition, severe    Time spent:    [facility]  [facility] Pager [phone]. If 7PM-7AM, please contact night-coverage at [email or web address], password [facility] 09/10/2012, 10:22 AM  LOS: 2 days

## 2012-09-10 NOTE — Clinical Social Work Psychosocial (Signed)
Clinical Social Work Department BRIEF PSYCHOSOCIAL ASSESSMENT 09/10/2012  Patient:  [name], [name]     Account Number:  [id number]     Admit date:  09/08/2012  Clinical Social Worker:  Nancie Neas  Date/Time:  09/10/2012 01:30 PM  Referred by:  CSW  Date Referred:  09/10/2012 Referred for  Other - See comment   Other Referral:   [facility]   Interview type:  Patient Other interview type:    PSYCHOSOCIAL DATA Living Status:  FAMILY Admitted from facility:   Level of care:   Primary support name:  [name] Primary support relationship to patient:  CHILD, ADULT Degree of support available:   supportive per pt    CURRENT CONCERNS Current Concerns  Other - See comment   Other Concerns:    SOCIAL WORK ASSESSMENT / PLAN CSW met with pt after referral from nutritionist. Pt alert and oriented and reports she lives with her daughter and son-in-law. Pt is generally independent and takes care of the home while her daughter and son-in-law are at work during the day. She said on Wednesday she tried to get out of bed in the morning and it took her three attempts. When she finally got up, pt fell. Her daughter-in-law happened to be at home because she didn't want to leave pt not feeling well and called EMS. Pt worked with PT today and recommendation is for home health. Pt reports she only enjoys eating with other people around. She has no problems eating when her daughter and son-in-law are home, but during the day when she is alone she just can't eat. Pt says she is just a "social eater." CSW recommended [facility] in [place] which is Monday-Thursday and a meal with activities are provided. Pt said she would consider this.   Assessment/plan status:  Referral to [facility] Other assessment/ plan:   Information/referral to community resources:   [facility]    PATIENT'S/FAMILY'S RESPONSE TO PLAN OF CARE: Pt appreciative of referral to [facility]  and said she would give it a try. No other SW needs reported, CSW signing off.       Derenda Fennel, [place] [phone]

## 2012-09-11 ENCOUNTER — Inpatient Hospital Stay (HOSPITAL_COMMUNITY): Payer: Medicare Other

## 2012-09-11 DIAGNOSIS — R1013 Epigastric pain: Secondary | ICD-10-CM

## 2012-09-11 DIAGNOSIS — K3189 Other diseases of stomach and duodenum: Secondary | ICD-10-CM

## 2012-09-11 LAB — BASIC METABOLIC PANEL
BUN: 8 mg/dL (ref 6–23)
CO2: 33 mEq/L — ABNORMAL HIGH (ref 19–32)
Calcium: 8.3 mg/dL — ABNORMAL LOW (ref 8.4–10.5)
Chloride: 101 mEq/L (ref 96–112)
Creatinine, Ser: 0.52 mg/dL (ref 0.50–1.10)

## 2012-09-11 LAB — VANCOMYCIN, TROUGH: Vancomycin Tr: 7.6 ug/mL — ABNORMAL LOW (ref 10.0–20.0)

## 2012-09-11 LAB — CBC
HCT: 33 % — ABNORMAL LOW (ref 36.0–46.0)
MCH: 28.2 pg (ref 26.0–34.0)
MCV: 91.2 fL (ref 78.0–100.0)
Platelets: 191 10*3/uL (ref 150–400)
RBC: 3.62 MIL/uL — ABNORMAL LOW (ref 3.87–5.11)

## 2012-09-11 MED ORDER — SACCHAROMYCES BOULARDII 250 MG PO CAPS
250.0000 mg | ORAL_CAPSULE | Freq: Two times a day (BID) | ORAL | Status: DC
  Administered 2012-09-11 – 2012-09-15 (×7): 250 mg via ORAL
  Filled 2012-09-11 (×9): qty 1

## 2012-09-11 MED ORDER — VANCOMYCIN HCL IN DEXTROSE 1-5 GM/200ML-% IV SOLN
1000.0000 mg | Freq: Two times a day (BID) | INTRAVENOUS | Status: DC
  Administered 2012-09-11 – 2012-09-12 (×3): 1000 mg via INTRAVENOUS
  Filled 2012-09-11 (×5): qty 200

## 2012-09-11 MED ORDER — FUROSEMIDE 10 MG/ML IJ SOLN
60.0000 mg | Freq: Once | INTRAMUSCULAR | Status: AC
  Administered 2012-09-11: 60 mg via INTRAVENOUS
  Filled 2012-09-11: qty 6

## 2012-09-11 MED ORDER — PANTOPRAZOLE SODIUM 40 MG PO TBEC
40.0000 mg | DELAYED_RELEASE_TABLET | Freq: Two times a day (BID) | ORAL | Status: DC
  Administered 2012-09-11 – 2012-09-15 (×7): 40 mg via ORAL
  Filled 2012-09-11 (×8): qty 1

## 2012-09-11 MED ORDER — IPRATROPIUM BROMIDE 0.02 % IN SOLN
0.5000 mg | RESPIRATORY_TRACT | Status: DC
  Administered 2012-09-11 – 2012-09-15 (×24): 0.5 mg via RESPIRATORY_TRACT
  Filled 2012-09-11 (×25): qty 2.5

## 2012-09-11 MED ORDER — PANTOPRAZOLE SODIUM 40 MG PO TBEC: 40.0000 mg | DELAYED_RELEASE_TABLET | Freq: Two times a day (BID) | ORAL | Status: DC

## 2012-09-11 MED ORDER — LEVALBUTEROL HCL 0.63 MG/3ML IN NEBU
0.6300 mg | INHALATION_SOLUTION | RESPIRATORY_TRACT | Status: DC
  Administered 2012-09-11 – 2012-09-15 (×25): 0.63 mg via RESPIRATORY_TRACT
  Filled 2012-09-11 (×25): qty 3

## 2012-09-11 MED ORDER — METRONIDAZOLE 500 MG PO TABS
500.0000 mg | ORAL_TABLET | Freq: Three times a day (TID) | ORAL | Status: DC
  Administered 2012-09-11 – 2012-09-13 (×4): 500 mg via ORAL
  Filled 2012-09-11 (×5): qty 1

## 2012-09-11 MED ORDER — ONDANSETRON HCL 4 MG/2ML IJ SOLN
4.0000 mg | Freq: Three times a day (TID) | INTRAMUSCULAR | Status: DC
  Administered 2012-09-11 – 2012-09-14 (×12): 4 mg via INTRAVENOUS
  Filled 2012-09-11 (×12): qty 2

## 2012-09-11 MED ORDER — VITAMIN K1 10 MG/ML IJ SOLN
5.0000 mg | Freq: Once | INTRAVENOUS | Status: AC
  Administered 2012-09-11: 5 mg via INTRAVENOUS
  Filled 2012-09-11: qty 0.5

## 2012-09-11 MED ORDER — POTASSIUM CHLORIDE CRYS ER 20 MEQ PO TBCR
40.0000 meq | EXTENDED_RELEASE_TABLET | Freq: Once | ORAL | Status: AC
  Administered 2012-09-11: 40 meq via ORAL
  Filled 2012-09-11: qty 2

## 2012-09-11 MED ORDER — METOPROLOL TARTRATE 25 MG PO TABS
25.0000 mg | ORAL_TABLET | Freq: Two times a day (BID) | ORAL | Status: DC
  Administered 2012-09-11: 25 mg via ORAL
  Filled 2012-09-11: qty 1

## 2012-09-11 MED ORDER — METOPROLOL TARTRATE 25 MG PO TABS
12.5000 mg | ORAL_TABLET | Freq: Two times a day (BID) | ORAL | Status: DC
  Administered 2012-09-11 – 2012-09-15 (×8): 12.5 mg via ORAL
  Filled 2012-09-11 (×8): qty 1

## 2012-09-11 NOTE — Progress Notes (Signed)
[facility] PROGRESS NOTE  SAFARI CINQUE ZOX:[id number] DOB: January 19, 1936 DOA: 09/08/2012 PCP: Ignatius Specking., MD  Assessment/Plan: 1. Sepsis due to pneumonia. Continued low grade temps.  Leukocytosis has resolved.  Clinically she is looking better. Continue current treatments. 2. HCAP/aspiration pneumonia. Continue broad-spectrum antibiotics and pulmonary hygiene. Chest x-ray shows persistent infiltrate consistent with pneumonia. Continue current treatments. 3. Chronic respiratory failure due to COPD. Patient is on 2.5 L of oxygen at home. Continue current treatments/nebulizer treatments. She feels that she is still mildly short of breath, but may be approaching her baseline. 4. Atrial fibrillation with rapid ventricular response. Blood pressure is better. Although her to slowly, appears to be significantly from yesterday. Blood pressure is stable. We will low-dose Lopressor through she does not appear to have any active wheezing. She is anticoagulated on Coumadin. 5. Severe protein calorie malnutrition. Nutrition consult 6. Nausea, dry heaves. Patient reports that she has persistent nausea and dry heaving. She has 2 urge to vomit to any by mouth intake other than water. She reports that she is having symptoms on and off for 2 months, but more so over the past week. She has seen Dr. Dionicia Abler in the past. Liver function tests and lipase are unremarkable. She is on proton pump inhibitors for GERD. She does well with significant relief with antiemetics. We will check an abdominal ultrasound to evaluate her gallbladder. Increase Protonix to twice a day. We will request a gastroenterology consultation to see if she needs an EGD.  Code Status: Full code Family Communication: Discussed with patient Disposition Plan: Transfer to telemetry today. Continue physical therapy, she will need to be home with home health physical  therapy   Consultants:  None  Procedures:  None  Antibiotics:  Vancomycin 6/18  Zosyn 6/18  HPI/Subjective: Feels very weak. Complaints of persistent nausea and dry heaves. By mouth intake has been poor. Breathing appears to be stable.   Objective: Filed Vitals:   09/11/12 0600 09/11/12 0709 09/11/12 0741 09/11/12 0936  BP:    125/70  Pulse: 94   96  Temp:   100.2 F (37.9 C)   TempSrc:   Oral   Resp: 31     Height:      Weight:      SpO2: 94% 96%      Intake/Output Summary (Last 24 hours) at 09/11/12 1030 Last data filed at 09/11/12 0600  Gross per 24 hour  Intake   1550 ml  Output     50 ml  Net   1500 ml   Filed Weights   09/09/12 0500 09/10/12 0500 09/11/12 0500  Weight: 58.3 kg (128 lb 8.5 oz) 58 kg (127 lb 13.9 oz) 58.5 kg (128 lb 15.5 oz)    Exam:   General:  No acute distress, appears to be chronically ill  Cardiovascular: S1, S2, irregular, tachycardic  Respiratory: Diminished breath sounds but clear bilaterally  Abdomen: Soft, nontender, nondistended, positive bowel sounds  Musculoskeletal: No pedal edema bilaterally, right arm is swollen likely due to infiltration of IVs.  Data Reviewed: Basic Metabolic Panel:  Recent Labs Lab 09/08/12 1450 09/09/12 0457 09/10/12 0431 09/11/12 0521  NA 137 139 139 138  K 3.2* 3.6 3.3* 3.3*  CL 97 101 101 101  CO2 32 31 32 33*  GLUCOSE 98 95 93 137*  BUN 9 8 7 8   CREATININE 0.64 0.59 0.58 0.52  CALCIUM 8.1* 8.0* 8.5 8.3*   Liver Function Tests:  Recent Labs Lab 09/09/12 0457  AST  14  ALT 6  ALKPHOS 88  BILITOT 1.2  PROT 4.9*  ALBUMIN 2.1*    Recent Labs Lab 09/10/12 0431  LIPASE 9*   No results found for this basename: AMMONIA,  in the last 168 hours CBC:  Recent Labs Lab 09/08/12 1450 09/09/12 0457 09/10/12 0431 09/11/12 0521  WBC 21.0* 14.0* 8.4 7.4  NEUTROABS 18.6*  --   --   --   HGB 12.0 10.6* 10.1* 10.2*  HCT 37.6 32.5* 32.3* 33.0*  MCV 90.0 90.5 90.7 91.2   PLT 228 207 204 191   Cardiac Enzymes:  Recent Labs Lab 09/08/12 1450  TROPONINI <0.30   BNP (last 3 results) No results found for this basename: PROBNP,  in the last 8760 hours CBG: No results found for this basename: GLUCAP,  in the last 168 hours  Recent Results (from the past 240 hour(s))  URINE CULTURE     Status: None   Collection Time    09/08/12  4:52 PM      Result Value Range Status   Specimen Description URINE, CATHETERIZED   Final   Special Requests NONE   Final   Culture  Setup Time 09/08/2012 17:52   Final   Colony Count NO GROWTH   Final   Culture NO GROWTH   Final   Report Status 09/09/2012 FINAL   Final  MRSA PCR SCREENING     Status: None   Collection Time    09/08/12  7:15 PM      Result Value Range Status   MRSA by PCR NEGATIVE  NEGATIVE Final   Comment:            The GeneXpert MRSA Assay (FDA     approved for NASAL specimens     only), is one component of a     comprehensive MRSA colonization     surveillance program. It is not     intended to diagnose MRSA     infection nor to guide or     monitor treatment for     MRSA infections.  CULTURE, BLOOD (ROUTINE X 2)     Status: None   Collection Time    09/08/12  7:57 PM      Result Value Range Status   Specimen Description BLOOD RIGHT HAND   Final   Special Requests BOTTLES DRAWN AEROBIC ONLY 4CC EACH   Final   Culture NO GROWTH 3 DAYS   Final   Report Status PENDING   Incomplete  CULTURE, BLOOD (ROUTINE X 2)     Status: None   Collection Time    09/08/12  8:09 PM      Result Value Range Status   Specimen Description BLOOD LEFT ANTECUBITAL   Final   Special Requests BOTTLES DRAWN AEROBIC AND ANAEROBIC 12CC EACH   Final   Culture NO GROWTH 3 DAYS   Final   Report Status PENDING   Incomplete     Studies: Dg Chest Port 1 View  09/10/2012   *RADIOLOGY REPORT*  Clinical Data: Short of breath, nausea and vomiting  PORTABLE CHEST - 1 VIEW  Comparison: Prior chest x-ray 09/08/2012  Findings:  Stable position of left subclavian approach cardiac rhythm maintenance device with leads projecting over the right atrium and right ventricle.  Stable cardiomegaly.  Atherosclerotic calcifications noted in the transverse aorta.  Similar appearance of diffuse patchy airspace disease in the right mid and lower lobe. Probable small right parapneumonic effusion.  Slightly increased linear opacities  in the left lung base. Background emphysema and COPD.  IMPRESSION:  1.  Similar appearance of diffuse patchy airspace disease in the right mid and lower lobe compared to 09/08/2012. Imaging appearance is most suggestive of pneumonia. 2.  Small right pleural effusion, possibly parapneumonic. 3.  Slightly increased linear opacities in the left lung base favored to reflect atelectasis.  Early developing infiltrate is difficult to exclude.   Original Report Authenticated By: Malachy Moan, M.D.   Dg Abd Portable 1v  09/10/2012   *RADIOLOGY REPORT*  Clinical Data: Nausea, vomiting  PORTABLE ABDOMEN - 1 VIEW  Comparison: Prior radiographs of the pelvis 09/08/2012; prior CT abdomen/pelvis 08/24/2012  Findings: Unremarkable, nonobstructed bowel gas pattern. Incompletely imaged right total hip arthroplasty.  No hardware complication of the visualized portions.  The bones appear osteopenic.  There is rotary dextroconvex scoliosis of the lumbar spine.  Scattered atherosclerotic vascular calcifications are noted.  Probable cardiomegaly.  IMPRESSION:  1.  Nonobstructed bowel gas pattern. 2.  Incompletely imaged cardiomegaly. 3.  Dextroconvex rotary lumbar scoliosis.   Original Report Authenticated By: Malachy Moan, M.D.    Scheduled Meds: . digoxin  125 mcg Oral Daily  . feeding supplement  237 mL Oral QPC supper  . feeding supplement  30 mL Oral TID WC  . guaiFENesin  600 mg Oral BID  . ipratropium  0.5 mg Nebulization Q6H  . levalbuterol  0.63 mg Nebulization Q4H  . mometasone-formoterol  2 puff Inhalation BID  .  pantoprazole  40 mg Oral Daily  . piperacillin-tazobactam (ZOSYN)  IV  3.375 g Intravenous Q8H  . vancomycin  1,000 mg Intravenous Q12H  . verapamil  120 mg Oral QHS  . verapamil  240 mg Oral q morning - 10a  . Warfarin - Pharmacist Dosing Inpatient   Does not apply Q24H   Continuous Infusions: . 0.9 % NaCl with KCl 20 mEq / L 50 mL/hr at 09/11/12 0600    Active Problems:   COPD   Chronic respiratory failure   Sepsis   Healthcare-associated pneumonia   Rapid atrial fibrillation   Dehydration   Hypokalemia   Chronic anticoagulation   Protein-calorie malnutrition, severe    Time spent:    [facility]  [facility] Pager [phone]. If 7PM-7AM, please contact night-coverage at [email or web address], password [facility] 09/11/2012, 10:30 AM  LOS: 3 days

## 2012-09-11 NOTE — Progress Notes (Signed)
Report called and given to meagan bullins, RN. Patient being transferred to dept 300. Patient alert, oriented, and in stable condition at the time of transfer. Patient transported to room 319 in wheelchair on oxygen. Patient's daughter at bedside during transfer. Patient's belongings transported with her.

## 2012-09-11 NOTE — Consult Note (Addendum)
Referring Provider: No ref. provider found Primary Care Physician:  Ignatius Specking., MD Primary Gastroenterologist:  DR. Jena Gauss  Reason for Consultation:  HEAVING AND GAGGING  HPI:  COUGH AND SOB ARE WORSE SINCE SHE'S FELT WEAK. PROBLEMS WITH HEAVING AND GAGGING FOR PAST 3-4 MOS. SEEMS TO BE MORE FREQUENT AT TIMES THEN HAS TIMES WHEN SHE FEELS OK. EPISODE ARE RANDOM. NO VOMITING. FEELS LIKE HOT WATER POURING OUT OF HER MOUTH. TAKING GENERIC PROTONIX ONCE A DAY. DR. Nolberto Hanlon. NO ENERGY FOR PAST 3-4 MOS. DOESN'T SEEM RELATED TO INDIGESTION. Weight up to 134 lbs in 2014 but now 128 lbs. +5 LBS SINCE MAR 2013. NO HAs OR CHANGE IN HER VISION. HAD WATERY DIARRHEA ABOUT AN HOUR AGO. WAS CONSTIPATED FOR ONE MO. TOOK DULCOLAX PO AND THEN SUPP AND STARTED HAVING A REGULAR BM. FELT HAD SUBJECTIVE FEVER(Tm 100F) AND CHILLS. QUIT SMOKING IN 2005. NO ETOH. NO ASPIRIN, BC/GOODY POWDERS, IBUPROFEN/MOTRIN, OR NAPROXEN/ALEVE. CAME TO ED FOR WEAK AND SHE FELL AND FAMILY COULDN'T GET HER UP.  ADMITTED TO [facility] FOR PNA/FALLS & NOW ON ZOSYN/VANCOMYCIN. 1 EPISODE OF C DIFF IN 2006.  PT DENIES BRBPR, vomiting, melena, abd pain, problems swallowing,heartburn or indigestion.  Past Medical History  Diagnosis Date  . Osteoporosis   . Diverticulosis   . Renal cyst   . Hyperthyroidism   . C. difficile colitis     12/06  . Lumbar and sacral arthritis   . COPD (chronic obstructive pulmonary disease)     PFT 10/05/08 - FEV1 0.89(43%), FVC 1.84(62%), FEV1% 49, TLC 5.64(120%), DLCO 38%  . Hypoxemia     Nocturnal oxygen  . Atrial fibrillation     Paroxysmal  . Sick sinus syndrome     Medtronic PPM 2004  . Pulmonary nodule   . Pneumonia due to Haemophilus influenzae 03/2011    with positive blood cultures as well    Past Surgical History  Procedure Laterality Date  . Total hip arthroplasty      Right  . Insert / replace / remove pacemaker      Medtronic  . Abdominal hysterectomy    . Bronchoscopy with washings   03/2011    mucus in right lower lobe with obstruction and some bleeding.  chroinc bronchitis, lung collapse,  Brushings showed numerous acute inflammatory cells and degenerated materal .  . Esophagogastroduodenoscopy  05/23/2011    ZOX:WRUE distal esophageal erosions consistent with mild  reflux esophagitis/otherwise normal  . Colonoscopy  05/23/2011    AVW:[id number] colorectal polyps-treated/large rectal polyp likely corresponds to the area of abnormal PET activity    Prior to Admission medications   Medication Sig Start Date End Date Taking? Authorizing Provider  ALPRAZolam (XANAX) 0.25 MG tablet Take 0.25 mg by mouth at bedtime. For anxiety   Yes Historical Provider, MD  digoxin (LANOXIN) 0.125 MG tablet Take 1 tablet (125 mcg total) by mouth daily. 09/12/11  Yes Jodelle Gross, NP  fluticasone-salmeterol (ADVAIR HFA) [phone] MCG/ACT inhaler Inhale 2 puffs into the lungs 2 (two) times daily.     Yes Historical Provider, MD  furosemide (LASIX) 40 MG tablet Take 40 mg by mouth daily as needed. Fluid retention   Yes Historical Provider, MD  metaxalone (SKELAXIN) 800 MG tablet Take 400 mg by mouth daily as needed for pain.   Yes Historical Provider, MD  metoprolol tartrate (LOPRESSOR) 25 MG tablet Take 1 tablet (25 mg total) by mouth 2 (two) times daily. 09/17/11 09/16/12 Yes Jonelle Sidle, MD  pantoprazole (PROTONIX)  40 MG tablet Take 40 mg by mouth daily.   Yes Historical Provider, MD  raloxifene (EVISTA) 60 MG tablet Take 60 mg by mouth daily.     Yes Historical Provider, MD  tiotropium (SPIRIVA) 18 MCG inhalation capsule Place 18 mcg into inhaler and inhale daily.     Yes Historical Provider, MD  verapamil (CALAN-SR) 240 MG CR tablet Take 120-240 mg by mouth 2 (two) times daily. Take 240 mg (whole tablet) in the mornings and 120 mg (1/2 tablet) in the evenings 09/22/11  Yes Jonelle Sidle, MD  warfarin (COUMADIN) 5 MG tablet Take 2.5-5 mg by mouth daily. As directed by coumadin clinic 5 mg  on Mon-Wed-Fri 2.5 mg on Sun- Tues- Thurs-Sat   Yes Historical Provider, MD    Current Facility-Administered Medications  Medication Dose Route Frequency Provider Last Rate Last Dose  .         .            .         . ALPRAZolam Prudy Feeler) tablet 0.25 mg  0.25 mg Oral TID PRN Erick Blinks, MD   0.25 mg at 09/10/12 2125  . digoxin (LANOXIN) tablet 125 mcg  125 mcg Oral Daily Erick Blinks, MD   125 mcg at 09/11/12 0936  . feeding supplement (ENSURE COMPLETE) liquid 237 mL  237 mL Oral QPC supper Charlann Noss Weisner, RD   237 mL at 09/10/12 1800  . feeding supplement (PRO-STAT SUGAR FREE 64) liquid 30 mL  30 mL Oral TID WC Francene Boyers, RD   30 mL at 09/10/12 1836  . guaiFENesin (MUCINEX) 12 hr tablet 600 mg  600 mg Oral BID Erick Blinks, MD   600 mg at 09/11/12 0936  . ipratropium (ATROVENT) nebulizer solution 0.5 mg  0.5 mg Nebulization Q4H Erick Blinks, MD   0.5 mg at 09/11/12 1156  . levalbuterol (XOPENEX) nebulizer solution 0.63 mg  0.63 mg Nebulization Q2H PRN Erick Blinks, MD      . levalbuterol (XOPENEX) nebulizer solution 0.63 mg  0.63 mg Nebulization Q4H Erick Blinks, MD   0.63 mg at 09/11/12 1156  . metoprolol tartrate (LOPRESSOR) tablet 25 mg  25 mg Oral BID Erick Blinks, MD   25 mg at 09/11/12 1125  . mometasone-formoterol (DULERA) 100-5 MCG/ACT inhaler 2 puff  2 puff Inhalation BID Erick Blinks, MD   2 puff at 09/11/12 0722  . ondansetron (ZOFRAN) tablet 4 mg  4 mg Oral Q6H PRN Erick Blinks, MD       Or  . ondansetron (ZOFRAN) injection 4 mg  4 mg Intravenous Q6H PRN Erick Blinks, MD   4 mg at 09/11/12 1146  . pantoprazole (PROTONIX) EC tablet 40 mg  40 mg Oral BID Erick Blinks, MD      . piperacillin-tazobactam (ZOSYN) IVPB 3.375 g  3.375 g Intravenous Q8H Erick Blinks, MD   3.375 g at 09/11/12 1124  . vancomycin (VANCOCIN) IVPB 1000 mg/200 mL premix  1,000 mg Intravenous Q12H [facility] [place], RPH   1,000 mg at 09/11/12 0948  . verapamil  (CALAN-SR) CR tablet 120 mg  120 mg Oral QHS Erick Blinks, MD   120 mg at 09/10/12 2125  . verapamil (CALAN-SR) CR tablet 240 mg  240 mg Oral q morning - 10a Erick Blinks, MD   240 mg at 09/11/12 0937  . Warfarin - Pharmacist Dosing Inpatient   Does not apply Q24H Erick Blinks, MD  Allergies as of 09/08/2012 - Review Complete 09/08/2012  Allergen Reaction Noted  . Cardizem (diltiazem hcl) Itching and Rash 07/06/2012  . Sulfonamide derivatives Rash     Family History:  Colon Cancer  negative                           Polyps  negative   History   Social History  . Marital Status: Widowed    Spouse Name: N/A    Number of Children: N/A  . Years of Education: N/A   Occupational History  . Retired     Therapist, occupational   Social History Main Topics  . Smoking status: Former Smoker -- 1.00 packs/day for 20 years    Types: Cigarettes  . Smokeless tobacco: Never Used  . Alcohol Use: No  . Drug Use: No  . Sexually Active: Not on file   Other Topics Concern  . Not on file   Social History Narrative  . No narrative on file    Review of Systems: PER HPI OTHERWISE ALL SYSTEMS ARE NEGATIVE.   Vitals: Blood pressure 135/80, pulse 107, temperature 100.2 F (37.9 C), temperature source Oral, resp. rate 31, height 5\' 5"  (1.651 m), weight 128 lb 15.5 oz (58.5 kg), SpO2 90.00%.  Physical Exam: General:   Alert,  pleasant and cooperative, ABLE TO COMPLETE FULL SENTENCES. Head:  Normocephalic and atraumatic. Eyes:  Sclera clear, no icterus.   Conjunctiva pink. Neck:  Supple; no masses. Lungs:  POOR AIR MOVEMENT BILATERALLY, COARSE BS BILATERALLY, INTERMITTENT wheezes. MILD distress. Heart:  IRREGULAR rhythm; UNABLE TO APPRECIATE A MURMUR. Abdomen:  Soft, nontender and nondistended. Normal bowel sounds, without guarding, and without rebound.   Msk:  Symmetrical, BARREL CHEST Extremities:  Without edema. Neurologic:  Alert and  oriented x4;  grossly normal  neurologically. Skin:  MULTIPLE AREAS OF ECCHYMOSIS, BAND-AID ON RIGHT ANKLE Cervical Nodes:  No significant cervical adenopathy. Psych:  Alert and cooperative. Normal mood and FLAT affect.  Lab Results:  Recent Labs  09/09/12 0457 09/10/12 0431 09/11/12 0521  WBC 14.0* 8.4 7.4  HGB 10.6* 10.1* 10.2*  HCT 32.5* 32.3* 33.0*  PLT 207 204 191   BMET  Recent Labs  09/10/12 0431 09/11/12 0521  NA 139 138  K 3.3* 3.3*  CL 101 101  CO2 32 33*  GLUCOSE 93 137*  BUN 7 8  CREATININE 0.58 0.52  CALCIUM 8.5 8.3*   LFT  Recent Labs  09/09/12 0457  PROT 4.9*  ALBUMIN 2.1*  AST 14  ALT 6  ALKPHOS 88  BILITOT 1.2     Studies/Results: PORTABLE CXR/ABD FILM: R PNA, COPD, NAIAP  Impression: PT WITH PMHx: GERD ON PROTONIX QD AT HOME. PRESENTS WITH WORSENING COUGH & DYSPEPSIA. RECENT EGD [facility] 2013. SX MOST LIKELY DUE TO UNCONTROLLED GERD ON DAILY PPI & EXACERBATED BY COUGHING. WEIGHT LOSS MOST LIKELY DUE TO PULMONARY CACHEXIA. PMHX:  CDIFF COLITIS ON VANC/ZOS AND HAVING DIARRHEA. ON COUMADIN, NO S/sX OF ACTIVE GIB. PT GOAL INR 2-3. ELEVATED INR DUE TO POOR PO INTAKE/ABX  Plan: 1. ADD ZOFRAN QAC AND HS. BID PPI AT 0800/1700. PLAN TO CONTINUE BID PPI AS OP. 2. FULL  LIQUID DIET. CONTINUE ENSURE 3. ADD FLORA-STOR AND FLAGYL TO PREVENT C DIFF. 4. MONITOR FOR S/SX OF C DIFF 5. PT NOT A CANDIDATE FOR SEDATION AT THIS TIME. NO GOOD CLINICAL INDICATION FOR EGD/CT ABD/PELVIS/HEAD AT THIS TIME. 6. VIT K 5 MG IVx1. MONITOR INR  QD. 7. AWAIT RESULTS OF U/S.  DISCUSSED PLAN WITH PT AND FAMILY(DAUGHTER/GRANDDAUGHTER).     LOS: 3 days   [facility]  09/11/2012, 12:06 PM

## 2012-09-11 NOTE — Progress Notes (Signed)
ANTICOAGULATION CONSULT NOTE  Pharmacy Consult for Warfarin Indication: atrial fibrillation  Allergies  Allergen Reactions  . Cardizem (Diltiazem Hcl) Itching and Rash  . Sulfonamide Derivatives Rash    Patient Measurements: Height: 5\' 5"  (165.1 cm) Weight: 128 lb 15.5 oz (58.5 kg) IBW/kg (Calculated) : 57 Vital Signs: Temp: 100.2 F (37.9 C) (06/21 0741) Temp src: Oral (06/21 0741) BP: 120/70 mmHg (06/21 0400) Pulse Rate: 94 (06/21 0600)  Labs:  Recent Labs  09/08/12 1450  09/09/12 0457 09/10/12 0431 09/11/12 0521  HGB 12.0  --  10.6* 10.1* 10.2*  HCT 37.6  --  32.5* 32.3* 33.0*  PLT 228  --  207 204 191  LABPROT  --   < > 23.8* 34.6* 36.5*  INR  --   < > 2.24* 3.71* 3.99*  CREATININE 0.64  --  0.59 0.58 0.52  TROPONINI <0.30  --   --   --   --   < > = values in this interval not displayed. Estimated Creatinine Clearance: 55.5 ml/min (by C-G formula based on Cr of 0.52).  Medical History: Past Medical History  Diagnosis Date  . Osteoporosis   . Diverticulosis   . Renal cyst   . Hyperthyroidism   . C. difficile colitis     12/06  . Lumbar and sacral arthritis   . COPD (chronic obstructive pulmonary disease)     PFT 10/05/08 - FEV1 0.89(43%), FVC 1.84(62%), FEV1% 49, TLC 5.64(120%), DLCO 38%  . Hypoxemia     Nocturnal oxygen  . Atrial fibrillation     Paroxysmal  . Sick sinus syndrome     Medtronic PPM 2004  . Pulmonary nodule   . Pneumonia due to Haemophilus influenzae 03/2011    with positive blood cultures as well    Medications:  Medications Prior to Admission  Medication Sig Dispense Refill  . ALPRAZolam (XANAX) 0.25 MG tablet Take 0.25 mg by mouth at bedtime. For anxiety      . digoxin (LANOXIN) 0.125 MG tablet Take 1 tablet (125 mcg total) by mouth daily.  90 tablet  3  . fluticasone-salmeterol (ADVAIR HFA) 115-21 MCG/ACT inhaler Inhale 2 puffs into the lungs 2 (two) times daily.        . furosemide (LASIX) 40 MG tablet Take 40 mg by mouth  daily as needed. Fluid retention      . metaxalone (SKELAXIN) 800 MG tablet Take 400 mg by mouth daily as needed for pain.      . metoprolol tartrate (LOPRESSOR) 25 MG tablet Take 1 tablet (25 mg total) by mouth 2 (two) times daily.  180 tablet  3  . pantoprazole (PROTONIX) 40 MG tablet Take 40 mg by mouth daily.      . raloxifene (EVISTA) 60 MG tablet Take 60 mg by mouth daily.        Marland Kitchen tiotropium (SPIRIVA) 18 MCG inhalation capsule Place 18 mcg into inhaler and inhale daily.        . verapamil (CALAN-SR) 240 MG CR tablet Take 120-240 mg by mouth 2 (two) times daily. Take 240 mg (whole tablet) in the mornings and 120 mg (1/2 tablet) in the evenings      . warfarin (COUMADIN) 5 MG tablet Take 2.5-5 mg by mouth daily. As directed by coumadin clinic 5 mg on Mon-Wed-Fri 2.5 mg on Sun- Tues- Thurs-Sat       Assessment: 77 yo F on chronic warfarin (2.5mg  daily except 5mg  on MWF) for Afib.  INR was therapeutic on  admission, but continues to rise despite holding warfarin dose.  This is likely due to acute illness.  No bleeding noted.   Goal of Therapy:  INR 2-3   Plan: Hold Warfarin again today INR/PT daily  Elson Clan 09/11/2012,9:12 AM

## 2012-09-11 NOTE — Progress Notes (Signed)
Patient refuses Pro-stat because of the taste and consistency but agrees to drink a vanilla ensure. Ensure will be kept at bedside.

## 2012-09-11 NOTE — Progress Notes (Signed)
Pt now on 5lpm nasal cannula, pulse oximetry 89, resp rate 32 , BNP  At 1031 this am 2403 .  *RADIOLOGY REPORT*  Clinical Data: Shortness of breath.  PORTABLE CHEST - 1 VIEW  Comparison: 09/10/2012.  Findings: The cardiac silhouette, mediastinal and hilar contours  are stable. The pacer wires are stable. Stable underlying  emphysematous changes with worsening bilateral airspace process,  right greater than left. This is likely infiltrates. Persistent  right pleural effusion.  IMPRESSION:  Worsening bilateral airspace process, right greater than left

## 2012-09-11 NOTE — Progress Notes (Signed)
ANTIBIOTIC CONSULT NOTE  Pharmacy Consult for Vancomycin & Zosyn Indication: HCAP / Aspiration pneumonia  Allergies  Allergen Reactions  . Cardizem (Diltiazem Hcl) Itching and Rash  . Sulfonamide Derivatives Rash    Patient Measurements: Height: 5\' 5"  (165.1 cm) Weight: 128 lb 15.5 oz (58.5 kg) IBW/kg (Calculated) : 57  Vital Signs: Temp: 100.2 F (37.9 C) (06/21 0741) Temp src: Oral (06/21 0741) BP: 120/70 mmHg (06/21 0400) Pulse Rate: 94 (06/21 0600) Intake/Output from previous day: 06/20 0701 - 06/21 0700 In: 1550 [I.V.:1200; IV Piggyback:350] Out: 50 [Urine:50] Intake/Output from this shift:    Labs:  Recent Labs  09/09/12 0457 09/10/12 0431 09/11/12 0521  WBC 14.0* 8.4 7.4  HGB 10.6* 10.1* 10.2*  PLT 207 204 191  CREATININE 0.59 0.58 0.52   Estimated Creatinine Clearance: 55.5 ml/min (by C-G formula based on Cr of 0.52).  Recent Labs  09/11/12 0759  VANCOTROUGH 7.6*     Microbiology: Recent Results (from the past 720 hour(s))  URINE CULTURE     Status: None   Collection Time    09/08/12  4:52 PM      Result Value Range Status   Specimen Description URINE, CATHETERIZED   Final   Special Requests NONE   Final   Culture  Setup Time 09/08/2012 17:52   Final   Colony Count NO GROWTH   Final   Culture NO GROWTH   Final   Report Status 09/09/2012 FINAL   Final  MRSA PCR SCREENING     Status: None   Collection Time    09/08/12  7:15 PM      Result Value Range Status   MRSA by PCR NEGATIVE  NEGATIVE Final   Comment:            The GeneXpert MRSA Assay (FDA     approved for NASAL specimens     only), is one component of a     comprehensive MRSA colonization     surveillance program. It is not     intended to diagnose MRSA     infection nor to guide or     monitor treatment for     MRSA infections.  CULTURE, BLOOD (ROUTINE X 2)     Status: None   Collection Time    09/08/12  7:57 PM      Result Value Range Status   Specimen Description BLOOD  RIGHT HAND   Final   Special Requests BOTTLES DRAWN AEROBIC ONLY 4CC EACH   Final   Culture NO GROWTH 3 DAYS   Final   Report Status PENDING   Incomplete  CULTURE, BLOOD (ROUTINE X 2)     Status: None   Collection Time    09/08/12  8:09 PM      Result Value Range Status   Specimen Description BLOOD LEFT ANTECUBITAL   Final   Special Requests BOTTLES DRAWN AEROBIC AND ANAEROBIC 12CC EACH   Final   Culture NO GROWTH 3 DAYS   Final   Report Status PENDING   Incomplete    Medical History: Past Medical History  Diagnosis Date  . Osteoporosis   . Diverticulosis   . Renal cyst   . Hyperthyroidism   . C. difficile colitis     12/06  . Lumbar and sacral arthritis   . COPD (chronic obstructive pulmonary disease)     PFT 10/05/08 - FEV1 0.89(43%), FVC 1.84(62%), FEV1% 49, TLC 5.64(120%), DLCO 38%  . Hypoxemia     Nocturnal  oxygen  . Atrial fibrillation     Paroxysmal  . Sick sinus syndrome     [employer] PPM 2004  . Pulmonary nodule   . Pneumonia due to Haemophilus influenzae 03/2011    with positive blood cultures as well    Medications:  Scheduled:  . digoxin  125 mcg Oral Daily  . feeding supplement  237 mL Oral QPC supper  . feeding supplement  30 mL Oral TID WC  . guaiFENesin  600 mg Oral BID  . ipratropium  0.5 mg Nebulization Q6H  . levalbuterol  0.63 mg Nebulization Q6H  . mometasone-formoterol  2 puff Inhalation BID  . pantoprazole  40 mg Oral Daily  . piperacillin-tazobactam (ZOSYN)  IV  3.375 g Intravenous Q8H  . vancomycin  1,000 mg Intravenous Q12H  . verapamil  120 mg Oral QHS  . verapamil  240 mg Oral q morning - 10a  . Warfarin - Pharmacist Dosing Inpatient   Does not apply Q24H   Assessment: Patient is on day#4 Vancomycin & Zosyn for HCAP.   Her renal function has been stable.  Patient is clearing Vancomycin better than estimated as evidenced by low Vancomycin trough level today.    Goal of Therapy:  Vancomycin trough level 15-20 mcg/ml  Plan:   Increase Vancomycin 1 GM IV every 12 hours Continue Zosyn 3.375 GM IV every 8 hours Recheck Vancomycin trough at steady state if medication continues Monitor renal function and cx data  Duration of therapy per MD  Elson Clan 09/11/2012,9:18 AM

## 2012-09-12 ENCOUNTER — Inpatient Hospital Stay (HOSPITAL_COMMUNITY): Payer: Medicare Other

## 2012-09-12 LAB — CBC
HCT: 32.7 % — ABNORMAL LOW (ref 36.0–46.0)
Hemoglobin: 10.1 g/dL — ABNORMAL LOW (ref 12.0–15.0)
MCH: 28.2 pg (ref 26.0–34.0)
RBC: 3.58 MIL/uL — ABNORMAL LOW (ref 3.87–5.11)

## 2012-09-12 LAB — BASIC METABOLIC PANEL
BUN: 7 mg/dL (ref 6–23)
CO2: 34 mEq/L — ABNORMAL HIGH (ref 19–32)
Calcium: 8.2 mg/dL — ABNORMAL LOW (ref 8.4–10.5)
Glucose, Bld: 98 mg/dL (ref 70–99)
Potassium: 3.2 mEq/L — ABNORMAL LOW (ref 3.5–5.1)
Sodium: 138 mEq/L (ref 135–145)

## 2012-09-12 LAB — PROTIME-INR: Prothrombin Time: 15.6 seconds — ABNORMAL HIGH (ref 11.6–15.2)

## 2012-09-12 MED ORDER — WARFARIN SODIUM 2.5 MG PO TABS
2.5000 mg | ORAL_TABLET | Freq: Once | ORAL | Status: AC
  Administered 2012-09-12: 2.5 mg via ORAL
  Filled 2012-09-12: qty 1

## 2012-09-12 MED ORDER — FUROSEMIDE 10 MG/ML IJ SOLN
40.0000 mg | Freq: Once | INTRAMUSCULAR | Status: AC
  Administered 2012-09-12: 40 mg via INTRAVENOUS
  Filled 2012-09-12: qty 4

## 2012-09-12 MED ORDER — LEVOFLOXACIN 750 MG PO TABS
750.0000 mg | ORAL_TABLET | Freq: Every day | ORAL | Status: DC
  Administered 2012-09-12 – 2012-09-15 (×4): 750 mg via ORAL
  Filled 2012-09-12 (×4): qty 1

## 2012-09-12 MED ORDER — POTASSIUM CHLORIDE CRYS ER 20 MEQ PO TBCR
40.0000 meq | EXTENDED_RELEASE_TABLET | ORAL | Status: AC
Start: 2012-09-12 — End: 2012-09-12
  Administered 2012-09-12: 40 meq via ORAL
  Filled 2012-09-12: qty 1
  Filled 2012-09-12: qty 2
  Filled 2012-09-12: qty 1

## 2012-09-12 NOTE — Progress Notes (Signed)
ANTICOAGULATION CONSULT NOTE  Pharmacy Consult for Warfarin Indication: atrial fibrillation  Allergies  Allergen Reactions  . Cardizem (Diltiazem Hcl) Itching and Rash  . Sulfonamide Derivatives Rash    Patient Measurements: Height: 5\' 5"  (165.1 cm) Weight: 128 lb 15.5 oz (58.5 kg) IBW/kg (Calculated) : 57 Vital Signs: Temp: 98.6 F (37 C) (06/22 0535) Temp src: Oral (06/22 0535) BP: 116/82 mmHg (06/22 0535) Pulse Rate: 94 (06/22 0535)  Labs:  Recent Labs  09/10/12 0431 09/11/12 0521 09/12/12 0556  HGB 10.1* 10.2* 10.1*  HCT 32.3* 33.0* 32.7*  PLT 204 191 201  LABPROT 34.6* 36.5* 15.6*  INR 3.71* 3.99* 1.27  CREATININE 0.58 0.52 0.65   Estimated Creatinine Clearance: 55.5 ml/min (by C-G formula based on Cr of 0.65).  Medical History: Past Medical History  Diagnosis Date  . Osteoporosis   . Diverticulosis   . Renal cyst   . Hyperthyroidism   . C. difficile colitis     12/06  . Lumbar and sacral arthritis   . COPD (chronic obstructive pulmonary disease)     PFT 10/05/08 - FEV1 0.89(43%), FVC 1.84(62%), FEV1% 49, TLC 5.64(120%), DLCO 38%  . Hypoxemia     Nocturnal oxygen  . Atrial fibrillation     Paroxysmal  . Sick sinus syndrome     [employer] PPM 2004  . Pulmonary nodule   . Pneumonia due to Haemophilus influenzae 03/2011    with positive blood cultures as well    Medications:  Medications Prior to Admission  Medication Sig Dispense Refill  . ALPRAZolam (XANAX) 0.25 MG tablet Take 0.25 mg by mouth at bedtime. For anxiety      . digoxin (LANOXIN) 0.125 MG tablet Take 1 tablet (125 mcg total) by mouth daily.  90 tablet  3  . fluticasone-salmeterol (ADVAIR HFA) 115-21 MCG/ACT inhaler Inhale 2 puffs into the lungs 2 (two) times daily.        . furosemide (LASIX) 40 MG tablet Take 40 mg by mouth daily as needed. Fluid retention      . metaxalone (SKELAXIN) 800 MG tablet Take 400 mg by mouth daily as needed for pain.      . metoprolol tartrate (LOPRESSOR)  25 MG tablet Take 1 tablet (25 mg total) by mouth 2 (two) times daily.  180 tablet  3  . pantoprazole (PROTONIX) 40 MG tablet Take 40 mg by mouth daily.      . raloxifene (EVISTA) 60 MG tablet Take 60 mg by mouth daily.        Marland Kitchen tiotropium (SPIRIVA) 18 MCG inhalation capsule Place 18 mcg into inhaler and inhale daily.        . verapamil (CALAN-SR) 240 MG CR tablet Take 120-240 mg by mouth 2 (two) times daily. Take 240 mg (whole tablet) in the mornings and 120 mg (1/2 tablet) in the evenings      . warfarin (COUMADIN) 5 MG tablet Take 2.5-5 mg by mouth daily. As directed by coumadin clinic 5 mg on Mon-Wed-Fri 2.5 mg on Sun- Tues- Thurs-Sat       Assessment: 77 yo F on chronic warfarin (2.5mg  daily except 5mg  on MWF) for Afib.  INR was therapeutic on admission, but continued to rise to supra-therapeutic levels likely due to acute illness, concomitant antibiotics, and poor oral intake. She was given Vit K 5mg  IV on 6/21 and INR is now at baseline.    No bleeding noted.   Goal of Therapy:  INR 2-3   Plan: Warfarin 2.5mg  po  x1 today INR/PT daily  Elson Clan 09/12/2012,9:32 AM

## 2012-09-12 NOTE — Progress Notes (Signed)
Patient having multiple bowel movements. c diff PCR was ordered and it came back negative.

## 2012-09-12 NOTE — Progress Notes (Signed)
At the beginning of the shift, patient had to have her oxygen brought up to 5L and her oxygen stat was on 89%. Patient alert and oriented. Breath sounds diminished, but patient was very short of breath. Doctor was notified and new orders were given for a chest xray. Once results came back doctor was made aware and ordered a one time dose of IV lasix. Patient has urinated over 1000cc in the last 3 hours. Patient breathing better now and oxygen stat is 94% on 3.5L. Will continue to monitor.

## 2012-09-12 NOTE — Progress Notes (Signed)
Subjective: Since I last evaluated the patient GAGGING AND HEAVING IS BETTER. HAVING WATERY STOOLS BUT THINKS ITS BETTER. NOT INTERESTED IN A SOFT MECH DIET TODAY. C/O MILD ABD PAIN.  Objective: Vital signs in last 24 hours: Temp:  [98.6 F (37 C)] 98.6 F (37 C) (06/22 0535) Pulse Rate:  [84-94] 94 (06/22 0535) Resp:  [32] 32 (06/21 1938) BP: (116-135)/(80-82) 116/82 mmHg (06/22 0535) SpO2:  [88 %-94 %] 91 % (06/22 0718) Last BM Date: 09/11/12  Intake/Output from previous day: 06/21 0701 - 06/22 0700 In: 1674.2 [I.V.:1124.2; IV Piggyback:550] Out: 2350 [Urine:2350] Intake/Output this shift:    General appearance: alert, cooperative and no distress Resp: diminished breath sounds anterior - bilateral and bilaterally Cardio: irregular rhythm,  GI: soft, MILD TTP IN ALL 4 QUADRANTS, NO REBOUND OR GUARDING; bowel sounds normal;  Lab Results:  Recent Labs  09/10/12 0431 09/11/12 0521 09/12/12 0556  WBC 8.4 7.4 7.0  HGB 10.1* 10.2* 10.1*  HCT 32.3* 33.0* 32.7*  PLT 204 191 201   BMET  Recent Labs  09/10/12 0431 09/11/12 0521 09/12/12 0556  NA 139 138 138  K 3.3* 3.3* 3.2*  CL 101 101 97  CO2 32 33* 34*  GLUCOSE 93 137* 98  BUN 7 8 7   CREATININE 0.58 0.52 0.65  CALCIUM 8.5 8.3* 8.2*   LFT No results found for this basename: PROT, ALBUMIN, AST, ALT, ALKPHOS, BILITOT, BILIDIR, IBILI,  in the last 72 hours PT/INR  Recent Labs  09/11/12 0521 09/12/12 0556  LABPROT 36.5* 15.6*  INR 3.99* 1.27   Hepatitis Panel No results found for this basename: HEPBSAG, HCVAB, HEPAIGM, HEPBIGM,  in the last 72 hours C-Diff No results found for this basename: CDIFFTOX,  in the last 72 hours Fecal Lactopherrin No results found for this basename: FECLLACTOFRN,  in the last 72 hours  Studies/Results: Dg Chest Port 1 View  09/11/2012   *RADIOLOGY REPORT*  Clinical Data: Shortness of breath.  PORTABLE CHEST - 1 VIEW  Comparison: 09/10/2012.  Findings: The cardiac silhouette,  mediastinal and hilar contours are stable.  The pacer wires are stable.  Stable underlying emphysematous changes with worsening bilateral airspace process, right greater than left.  This is likely infiltrates.  Persistent right pleural effusion.  IMPRESSION: Worsening bilateral airspace process, right greater than left.   Original Report Authenticated By: Rudie Meyer, M.D.   US Abdomen Limited Ruq  09/12/2012   *RADIOLOGY REPORT*  Clinical Data:  Nausea and vomiting.  LIMITED ABDOMINAL ULTRASOUND - RIGHT UPPER QUADRANT  Comparison:  None.  Findings:  Gallbladder:  No shadowing gallstones or echogenic sludge.  No gallbladder wall thickening or pericholecystic fluid.  Negative sonographic Murphy's sign according to the ultrasound technologist.  Common bile duct:  Normal caliber of 5 mm.  Liver:  No focal mass lesion seen.  Within normal limits in parenchymal echogenicity.  IMPRESSION: Normal right upper quadrant ultrasound.                    Original Report Authenticated By: Irish Lack, M.D.    Medications: I have reviewed the patient's current medications.  Assessment/Plan: ADMITTED WITH DYSPEPSIA/UNCONTROLLED REFLUX. CLINICALLY IMPROVED AFTER BID PPI/ZOFRAN QAC/HS. CXR JUN 21 REVEALS WORSENING INFILTRATES BIL. WATERY/LOOSE STOOL MOST LIKELY DUE TO ABX. ON EMPIRIC FLAGYL/FLORASTOR DUE TO HX: C DIFF COLITIS. INR 1.27 AFTER VIT 5 5 MG IV.  PLAN:  1. CONTINUE FLORASTOR/FLAGYL 2. FULL LIQUID DIET 3. BID PPI/ZOFRAN QAC/HS    LOS: 4 days  Harlea Goetzinger 09/12/2012, 12:17 PM

## 2012-09-12 NOTE — Progress Notes (Signed)
[facility] PROGRESS NOTE  [name] ZOX:[id number] DOB: May 22, 1935 DOA: 09/08/2012 PCP: Ignatius Specking., MD  Assessment/Plan: 1. Sepsis due to pneumonia. She is now afebrile. Leukocytosis has resolved.  Clinically she is looking better. Continue current treatments. 2. HCAP/aspiration pneumonia. She has not been febrile and WBC count is normal. We'll discontinue vancomycin and Zosyn. Start the patient on oral levofloxacin. 3. Chronic respiratory failure due to COPD. Patient is on 2.5 L of oxygen at home. Continue current treatments/nebulizer treatments. Repeat x-ray today showed increasing interstitial edema. She did receive one dose of Lasix with good diuresis. We'll give her another dose today. Continue to wean down oxygen as tolerated.  4. Atrial fibrillation with rapid ventricular response. Heart rate appears to be doing better on beta blocker and verapamil. Continue current treatments. She is anticoagulated on Coumadin. 5. Severe protein calorie malnutrition. Nutrition consult 6. Nausea, dry heaves. Patient reports that she has persistent nausea and dry heaving. She has seen Dr. Karilyn Cota in the past. Liver function tests and lipase are unremarkable. Abdominal ultrasound also unremarkable. She is on proton pump inhibitors for GERD. She was seen by gastroenterology yesterday. Zofran was changed to scheduled dosing every 6 hours and Protonix was changed to twice a day. The patient reports some improvement in her symptoms today. We'll continue to advance diet as tolerated.  Code Status: Full code Family Communication: Discussed with patient Disposition Plan: Discharge home once improved. Continue physical therapy, she will need to be home with home health physical therapy   Consultants:  None  Procedures:  None  Antibiotics:  Vancomycin 6/18 - 6/22  Zosyn 6/18 - 6/22  HPI/Subjective: Still feels very weak. Nausea appears to be improving some. She is able to tolerate soup  today. She is having some loose stools today. C. difficile was found to be negative.   Objective: Filed Vitals:   09/12/12 0348 09/12/12 0535 09/12/12 0718 09/12/12 1445  BP:  116/82  103/45  Pulse:  94  70  Temp:  98.6 F (37 C)  97.9 F (36.6 C)  TempSrc:  Oral  Oral  Resp:    20  Height:      Weight:      SpO2: 88% 93% 91% 93%    Intake/Output Summary (Last 24 hours) at 09/12/12 1515 Last data filed at 09/12/12 1446  Gross per 24 hour  Intake 2274.17 ml  Output   3000 ml  Net -725.83 ml   Filed Weights   09/09/12 0500 09/10/12 0500 09/11/12 0500  Weight: 58.3 kg (128 lb 8.5 oz) 58 kg (127 lb 13.9 oz) 58.5 kg (128 lb 15.5 oz)    Exam:   General:  No acute distress, appears to be chronically ill  Cardiovascular: S1, S2, irregular, tachycardic  Respiratory: diminished breath sounds at bases with slight crackles.  Abdomen: Soft, nontender, nondistended, positive bowel sounds  Musculoskeletal: no pedal edema b/l.  Data Reviewed: Basic Metabolic Panel:  Recent Labs Lab 09/08/12 1450 09/09/12 0457 09/10/12 0431 09/11/12 0521 09/12/12 0556  NA 137 139 139 138 138  K 3.2* 3.6 3.3* 3.3* 3.2*  CL 97 101 101 101 97  CO2 32 31 32 33* 34*  GLUCOSE 98 95 93 137* 98  BUN 9 8 7 8 7   CREATININE 0.64 0.59 0.58 0.52 0.65  CALCIUM 8.1* 8.0* 8.5 8.3* 8.2*   Liver Function Tests:  Recent Labs Lab 09/09/12 0457  AST 14  ALT 6  ALKPHOS 88  BILITOT 1.2  PROT 4.9*  ALBUMIN 2.1*    Recent Labs Lab 09/10/12 0431  LIPASE 9*   No results found for this basename: AMMONIA,  in the last 168 hours CBC:  Recent Labs Lab 09/08/12 1450 09/09/12 0457 09/10/12 0431 09/11/12 0521 09/12/12 0556  WBC 21.0* 14.0* 8.4 7.4 7.0  NEUTROABS 18.6*  --   --   --   --   HGB 12.0 10.6* 10.1* 10.2* 10.1*  HCT 37.6 32.5* 32.3* 33.0* 32.7*  MCV 90.0 90.5 90.7 91.2 91.3  PLT 228 207 204 191 201   Cardiac Enzymes:  Recent Labs Lab 09/08/12 1450  TROPONINI <0.30   BNP  (last 3 results)  Recent Labs  09/11/12 1031  PROBNP 2403.0*   CBG: No results found for this basename: GLUCAP,  in the last 168 hours  Recent Results (from the past 240 hour(s))  URINE CULTURE     Status: None   Collection Time    09/08/12  4:52 PM      Result Value Range Status   Specimen Description URINE, CATHETERIZED   Final   Special Requests NONE   Final   Culture  Setup Time 09/08/2012 17:52   Final   Colony Count NO GROWTH   Final   Culture NO GROWTH   Final   Report Status 09/09/2012 FINAL   Final  MRSA PCR SCREENING     Status: None   Collection Time    09/08/12  7:15 PM      Result Value Range Status   MRSA by PCR NEGATIVE  NEGATIVE Final   Comment:            The GeneXpert MRSA Assay (FDA     approved for NASAL specimens     only), is one component of a     comprehensive MRSA colonization     surveillance program. It is not     intended to diagnose MRSA     infection nor to guide or     monitor treatment for     MRSA infections.  CULTURE, BLOOD (ROUTINE X 2)     Status: None   Collection Time    09/08/12  7:57 PM      Result Value Range Status   Specimen Description BLOOD RIGHT HAND   Final   Special Requests BOTTLES DRAWN AEROBIC ONLY 4CC EACH   Final   Culture NO GROWTH 4 DAYS   Final   Report Status PENDING   Incomplete  CULTURE, BLOOD (ROUTINE X 2)     Status: None   Collection Time    09/08/12  8:09 PM      Result Value Range Status   Specimen Description BLOOD LEFT ANTECUBITAL   Final   Special Requests BOTTLES DRAWN AEROBIC AND ANAEROBIC 12CC EACH   Final   Culture NO GROWTH 4 DAYS   Final   Report Status PENDING   Incomplete  CLOSTRIDIUM DIFFICILE BY PCR     Status: None   Collection Time    09/11/12  8:29 PM      Result Value Range Status   C difficile by pcr NEGATIVE  NEGATIVE Final     Studies: Dg Chest Port 1 View  09/11/2012   *RADIOLOGY REPORT*  Clinical Data: Shortness of breath.  PORTABLE CHEST - 1 VIEW  Comparison: 09/10/2012.   Findings: The cardiac silhouette, mediastinal and hilar contours are stable.  The pacer wires are stable.  Stable underlying emphysematous changes with worsening bilateral airspace process, right greater  than left.  This is likely infiltrates.  Persistent right pleural effusion.  IMPRESSION: Worsening bilateral airspace process, right greater than left.   Original Report Authenticated By: Rudie Meyer, M.D.   US Abdomen Limited Ruq  09/12/2012   *RADIOLOGY REPORT*  Clinical Data:  Nausea and vomiting.  LIMITED ABDOMINAL ULTRASOUND - RIGHT UPPER QUADRANT  Comparison:  None.  Findings:  Gallbladder:  No shadowing gallstones or echogenic sludge.  No gallbladder wall thickening or pericholecystic fluid.  Negative sonographic Murphy's sign according to the ultrasound technologist.  Common bile duct:  Normal caliber of 5 mm.  Liver:  No focal mass lesion seen.  Within normal limits in parenchymal echogenicity.  IMPRESSION: Normal right upper quadrant ultrasound.                    Original Report Authenticated By: Irish Lack, M.D.    Scheduled Meds: . digoxin  125 mcg Oral Daily  . feeding supplement  237 mL Oral QPC supper  . feeding supplement  30 mL Oral TID WC  . furosemide  40 mg Intravenous Once  . guaiFENesin  600 mg Oral BID  . ipratropium  0.5 mg Nebulization Q4H  . levalbuterol  0.63 mg Nebulization Q4H  . levofloxacin  750 mg Oral Daily  . metoprolol tartrate  12.5 mg Oral BID  . metroNIDAZOLE  500 mg Oral TID WC  . mometasone-formoterol  2 puff Inhalation BID  . ondansetron  4 mg Intravenous TID AC & HS  . pantoprazole  40 mg Oral BID AC  . potassium chloride  40 mEq Oral Q3H  . saccharomyces boulardii  250 mg Oral BID  . verapamil  120 mg Oral QHS  . verapamil  240 mg Oral q morning - 10a  . warfarin  2.5 mg Oral ONCE-1800  . Warfarin - Pharmacist Dosing Inpatient   Does not apply Q24H   Continuous Infusions:    Active Problems:   COPD   Chronic respiratory failure    Sepsis   Healthcare-associated pneumonia   Rapid atrial fibrillation   Dehydration   Hypokalemia   Chronic anticoagulation   Protein-calorie malnutrition, severe    Time spent:    [facility]  [facility] Pager [phone]. If 7PM-7AM, please contact night-coverage at [email or web address], password [facility] 09/12/2012, 3:15 PM  LOS: 4 days

## 2012-09-12 NOTE — Progress Notes (Signed)
Increased oxygen back to 4lpm/[place] for now. Pulse ox 88 while at 3.5 lpm. Pt was asleep, dropped to 86 going to Bathroom.

## 2012-09-13 DIAGNOSIS — R112 Nausea with vomiting, unspecified: Secondary | ICD-10-CM

## 2012-09-13 DIAGNOSIS — E876 Hypokalemia: Secondary | ICD-10-CM

## 2012-09-13 DIAGNOSIS — I369 Nonrheumatic tricuspid valve disorder, unspecified: Secondary | ICD-10-CM

## 2012-09-13 DIAGNOSIS — I10 Essential (primary) hypertension: Secondary | ICD-10-CM

## 2012-09-13 DIAGNOSIS — E860 Dehydration: Secondary | ICD-10-CM

## 2012-09-13 LAB — CULTURE, BLOOD (ROUTINE X 2)
Culture: NO GROWTH
Culture: NO GROWTH

## 2012-09-13 LAB — PROTIME-INR
INR: 1.2 (ref 0.00–1.49)
Prothrombin Time: 15 seconds (ref 11.6–15.2)

## 2012-09-13 LAB — BASIC METABOLIC PANEL
CO2: 38 mEq/L — ABNORMAL HIGH (ref 19–32)
Chloride: 94 mEq/L — ABNORMAL LOW (ref 96–112)
GFR calc Af Amer: >90 mL/min (ref >90)
Potassium: 3.1 mEq/L — ABNORMAL LOW (ref 3.5–5.1)

## 2012-09-13 LAB — CBC
HCT: 33.6 % — ABNORMAL LOW (ref 36.0–46.0)
Hemoglobin: 10.5 g/dL — ABNORMAL LOW (ref 12.0–15.0)
WBC: 6.9 10*3/uL (ref 4.0–10.5)

## 2012-09-13 MED ORDER — PROMETHAZINE HCL 25 MG/ML IJ SOLN
6.2500 mg | Freq: Three times a day (TID) | INTRAMUSCULAR | Status: DC | PRN
Start: 2012-09-13 — End: 2012-09-15
  Administered 2012-09-13: 6.25 mg via INTRAVENOUS
  Filled 2012-09-13: qty 1

## 2012-09-13 MED ORDER — METOCLOPRAMIDE HCL 5 MG/ML IJ SOLN
5.0000 mg | Freq: Two times a day (BID) | INTRAMUSCULAR | Status: DC
  Administered 2012-09-14 (×2): 5 mg via INTRAVENOUS
  Filled 2012-09-13 (×2): qty 2

## 2012-09-13 MED ORDER — POTASSIUM CHLORIDE CRYS ER 20 MEQ PO TBCR
40.0000 meq | EXTENDED_RELEASE_TABLET | Freq: Once | ORAL | Status: AC
  Administered 2012-09-13: 40 meq via ORAL
  Filled 2012-09-13: qty 2

## 2012-09-13 MED ORDER — WARFARIN SODIUM 5 MG PO TABS
5.0000 mg | ORAL_TABLET | Freq: Once | ORAL | Status: AC
  Administered 2012-09-13: 5 mg via ORAL
  Filled 2012-09-13: qty 1

## 2012-09-13 NOTE — Progress Notes (Addendum)
[name]:[id number] DOB: 10/08/1935 DOA: 09/08/2012 PCP: Ignatius Specking., MD   Subjective: This lady says she feels still weak. She has not been mobilizing much. She does not want to advance her diet much either. She still somewhat short of breath. Still had some watery stools yesterday.           Physical Exam: Blood pressure 110/65, pulse 121, temperature 98.3 F (36.8 C), temperature source Oral, resp. rate 20, height 5\' 5"  (1.651 m), weight 58.5 kg (128 lb 15.5 oz), SpO2 90.00%. Afebrile. Does not appear to be toxic. There is no increased work of breathing. Heart sounds are present and in atrial fibrillation, ventricular rate is somewhat increased today. Lung fields are clinically clear without any evidence of crackles, wheezing or bronchial breathing. She is alert and orientated without any focal neurological signs. She appears to be cachectic.   Investigations:  Recent Results (from the past 240 hour(s))  URINE CULTURE     Status: None   Collection Time    09/08/12  4:52 PM      Result Value Range Status   Specimen Description URINE, CATHETERIZED   Final   Special Requests NONE   Final   Culture  Setup Time 09/08/2012 17:52   Final   Colony Count NO GROWTH   Final   Culture NO GROWTH   Final   Report Status 09/09/2012 FINAL   Final  MRSA PCR SCREENING     Status: None   Collection Time    09/08/12  7:15 PM      Result Value Range Status   MRSA by PCR NEGATIVE  NEGATIVE Final   Comment:            The GeneXpert MRSA Assay (FDA     approved for NASAL specimens     only), is one component of a     comprehensive MRSA colonization     surveillance program. It is not     intended to diagnose MRSA     infection nor to guide or     monitor treatment for     MRSA infections.  CULTURE, BLOOD (ROUTINE X 2)     Status: None   Collection Time    09/08/12  7:57 PM      Result Value Range Status   Specimen Description BLOOD RIGHT HAND   Final   Special  Requests BOTTLES DRAWN AEROBIC ONLY 4CC EACH   Final   Culture NO GROWTH 4 DAYS   Final   Report Status PENDING   Incomplete  CULTURE, BLOOD (ROUTINE X 2)     Status: None   Collection Time    09/08/12  8:09 PM      Result Value Range Status   Specimen Description BLOOD LEFT ANTECUBITAL   Final   Special Requests BOTTLES DRAWN AEROBIC AND ANAEROBIC 12CC EACH   Final   Culture NO GROWTH 4 DAYS   Final   Report Status PENDING   Incomplete  CLOSTRIDIUM DIFFICILE BY PCR     Status: None   Collection Time    09/11/12  8:29 PM      Result Value Range Status   C difficile by pcr NEGATIVE  NEGATIVE Final     Basic Metabolic Panel:  Recent Labs  81/19/14 0556 09/13/12 0445  NA 138 138  K 3.2* 3.1*  CL 97 94*  CO2 34* 38*  GLUCOSE 98 74  BUN 7 8  CREATININE 0.65 0.69  CALCIUM 8.2*  8.5   Liver Function Tests: No results found for this basename: AST, ALT, ALKPHOS, BILITOT, PROT, ALBUMIN,  in the last 72 hours   CBC:  Recent Labs  09/12/12 0556 09/13/12 0445  WBC 7.0 6.9  HGB 10.1* 10.5*  HCT 32.7* 33.6*  MCV 91.3 90.6  PLT 201 214    Dg Chest Port 1 View  09/11/2012   *RADIOLOGY REPORT*  Clinical Data: Shortness of breath.  PORTABLE CHEST - 1 VIEW  Comparison: 09/10/2012.  Findings: The cardiac silhouette, mediastinal and hilar contours are stable.  The pacer wires are stable.  Stable underlying emphysematous changes with worsening bilateral airspace process, right greater than left.  This is likely infiltrates.  Persistent right pleural effusion.  IMPRESSION: Worsening bilateral airspace process, right greater than left.   Original Report Authenticated By: Rudie Meyer, M.D.   US Abdomen Limited Ruq  09/12/2012   *RADIOLOGY REPORT*  Clinical Data:  Nausea and vomiting.  LIMITED ABDOMINAL ULTRASOUND - RIGHT UPPER QUADRANT  Comparison:  None.  Findings:  Gallbladder:  No shadowing gallstones or echogenic sludge.  No gallbladder wall thickening or pericholecystic fluid.   Negative sonographic Murphy's sign according to the ultrasound technologist.  Common bile duct:  Normal caliber of 5 mm.  Liver:  No focal mass lesion seen.  Within normal limits in parenchymal echogenicity.  IMPRESSION: Normal right upper quadrant ultrasound.                    Original Report Authenticated By: Irish Lack, M.D.      Medications: I have reviewed the patient's current medications.  Impression: 1. Healthcare associated pneumonia/aspiration pneumonia. Improving. On oral antibiotics now. 2. COPD and chronic respiratory failure, appears to be at her baseline in terms of oxygen requirement. 3. Atrial fibrillation with rapid ventricular response, ventricular rate is increased today. 4. Severe protein calorie malnutrition secondary to #1 and generalized deconditioning, and gastrointestinal issues. 5. GERD, causing significant symptoms. C. difficile toxin negative. 6. Hypokalemia.     Plan: 1. Discontinue metronidazole. Replete potassium. 2. Encourage mobilization and advancing diet.  Consultants:  Gastroenterology, Dr. Darrick Penna.   Procedures:  None.   Antibiotics:  Oral Levaquin started 09/12/2012.                   Code Status: Full code.  Family Communication: Discussed plan with patient at the bedside.   Disposition Plan: Depending on progress.  Time spent: 20 minutes.   LOS: 5 days   Wilson Singer Pager [phone]  09/13/2012, 9:43 AM

## 2012-09-13 NOTE — Progress Notes (Signed)
Subjective:  No appetite. Ate soup and oatmeal yesterday but doesn't not feel like eating this morning. No vomiting. Feels very weak. Only getting out of bed to use bedside commode. 5+ watery stools yesterday. Last one at midnight. No abdominal pain or blood in stool.   Objective: Vital signs in last 24 hours: Temp:  [97.9 F (36.6 C)-98.5 F (36.9 C)] 98.3 F (36.8 C) (06/23 0515) Pulse Rate:  [70-128] 121 (06/23 0515) Resp:  [20] 20 (06/23 0515) BP: (95-110)/(45-65) 110/65 mmHg (06/23 0515) SpO2:  [89 %-98 %] 90 % (06/23 0757) Last BM Date: 09/12/12 General:   Alert,  Chronically-ill appearing WF, pursed-lip breathing. NAD. Head:  Normocephalic and atraumatic. Eyes:  Sclera clear, no icterus.   Abdomen:  Soft, nontender and nondistended.  Normal bowel sounds, without guarding, and without rebound.   Extremities:  Without clubbing, deformity or edema. Neurologic:  Alert and  oriented x4;  grossly normal neurologically. Skin:  Intact without significant lesions or rashes. Psych:  Alert and cooperative. Normal mood and affect.  Intake/Output from previous day: 06/22 0701 - 06/23 0700 In: 600 [P.O.:600] Out: 650 [Urine:650] Intake/Output this shift:    Lab Results: CBC  Recent Labs  09/11/12 0521 09/12/12 0556 09/13/12 0445  WBC 7.4 7.0 6.9  HGB 10.2* 10.1* 10.5*  HCT 33.0* 32.7* 33.6*  MCV 91.2 91.3 90.6  PLT 191 201 214   BMET  Recent Labs  09/11/12 0521 09/12/12 0556 09/13/12 0445  NA 138 138 138  K 3.3* 3.2* 3.1*  CL 101 97 94*  CO2 33* 34* 38*  GLUCOSE 137* 98 74  BUN 8 7 8   CREATININE 0.52 0.65 0.69  CALCIUM 8.3* 8.2* 8.5   LFTs No results found for this basename: BILITOT, BILIDIR, IBILI, ALKPHOS, AST, ALT, PROT, ALBUMIN,  in the last 72 hours No results found for this basename: LIPASE,  in the last 72 hours PT/INR  Recent Labs  09/11/12 0521 09/12/12 0556 09/13/12 0445  LABPROT 36.5* 15.6* 15.0  INR 3.99* 1.27 1.20      Imaging  Studies: Ct Head Wo Contrast  09/08/2012   *RADIOLOGY REPORT*  Clinical Data: Altered level of consciousness secondary to a fall today.  CT HEAD WITHOUT CONTRAST  Technique:  Contiguous axial images were obtained from the base of the skull through the vertex without contrast.  Comparison: None.  Findings: There is no acute intracranial hemorrhage or mass lesion. There are vague areas of lucency in the basal ganglia bilaterally consistent with white matter infarcts, age indeterminate.  There is diffuse slight cerebral cortical and cerebellar atrophy.  No acute osseous abnormality.  Tiny osteoma in the right side of the frontal sinus.  IMPRESSION: Small white matter infarcts in both basal ganglia.  Atrophy. No hemorrhage.   Original Report Authenticated By: Francene Boyers, M.D.    Dg Chest Port 1 View  09/11/2012   *RADIOLOGY REPORT*  Clinical Data: Shortness of breath.  PORTABLE CHEST - 1 VIEW  Comparison: 09/10/2012.  Findings: The cardiac silhouette, mediastinal and hilar contours are stable.  The pacer wires are stable.  Stable underlying emphysematous changes with worsening bilateral airspace process, right greater than left.  This is likely infiltrates.  Persistent right pleural effusion.  IMPRESSION: Worsening bilateral airspace process, right greater than left.   Original Report Authenticated By: Rudie Meyer, M.D.    Dg Abd Portable 1v  09/10/2012   *RADIOLOGY REPORT*  Clinical Data: Nausea, vomiting  PORTABLE ABDOMEN - 1 VIEW  Comparison: Prior radiographs  of the pelvis 09/08/2012; prior CT abdomen/pelvis 08/24/2012  Findings: Unremarkable, nonobstructed bowel gas pattern. Incompletely imaged right total hip arthroplasty.  No hardware complication of the visualized portions.  The bones appear osteopenic.  There is rotary dextroconvex scoliosis of the lumbar spine.  Scattered atherosclerotic vascular calcifications are noted.  Probable cardiomegaly.  IMPRESSION:  1.  Nonobstructed bowel gas pattern. 2.   Incompletely imaged cardiomegaly. 3.  Dextroconvex rotary lumbar scoliosis.   Original Report Authenticated By: Malachy Moan, M.D.   US Abdomen Limited Ruq  09/12/2012   *RADIOLOGY REPORT*  Clinical Data:  Nausea and vomiting.  LIMITED ABDOMINAL ULTRASOUND - RIGHT UPPER QUADRANT  Comparison:  None.  Findings:  Gallbladder:  No shadowing gallstones or echogenic sludge.  No gallbladder wall thickening or pericholecystic fluid.  Negative sonographic Murphy's sign according to the ultrasound technologist.  Common bile duct:  Normal caliber of 5 mm.  Liver:  No focal mass lesion seen.  Within normal limits in parenchymal echogenicity.  IMPRESSION: Normal right upper quadrant ultrasound.                    Original Report Authenticated By: Irish Lack, M.D.  [2 weeks]   Assessment: 77 y/o female with oxygen-dependent COPD, chronic A.Fib on coumadin, sick sinus syndrome s/p pacemaker who presented to ER secondary to fall and increasing weakness. Admitted with developing pneumonia/sepsis. Complains of several month h/o gagging/heaving/anorexia, GERD, weight loss. Watery stools since admission. Last EGD 2013 as outlined. On Flagyl/Flora-stor due to prior h/o C.Diff and trying to prevent recurrence (current PCR negative). Abd u/s unremarkable. No gallstones or wall thickening. Eating 25-50% of meals the last 24 hours. On Zofran ATC, Protonix BID. Diarrhea "little better" per patient. Likely related to antibiotics. Continue to monitor.   CT A/P with contrast on 08/24/12 at [facility]:  IMPRESSION:  1. Diverticulosis. No radiographic evidence of diverticulitis. 2. No evidence of abdominal or pelvic metastatic disease or other acute findings.  Plan: 1. Continue current medical management. Continue full liquids, patient is not interested in advancing diet.  2. Check fasting AM cortisol level.  3. Recent contrast CT A/P reassuring.    LOS: 5 days   Tana Coast  09/13/2012, 8:29 AM

## 2012-09-13 NOTE — Progress Notes (Signed)
REVIEWED. AGREE.PT NOT A CANDIDATE FOR GES-DOUBT SHE CAN LAY FLAT FOR 2 HOURS. AWAIT CORTISOL. CHECK ANEMIA PANEL. MONITOR DAILY. CONSIDER EGD FOR GASTRIC/DUODENAL Bx.

## 2012-09-13 NOTE — Progress Notes (Signed)
PT Cancellation Note  Patient Details Name: [name] MRN: [id number] DOB: [birth year]   Cancelled Treatment:    Reason Eval/Treat Not Completed: Fatigue/lethargy limiting ability to participate Pt refuses PT.  She states that she is simply too weak to try to work with [place].  She reports having to be up to the [facility] frequently due to persistent diarrhea.  I had a lengthy discussion with pt and daughter.  Daughter states that she does not want pt to go to "the nursing home" under any circumstances.  They have a friend who can come and stay with pt while family is at work.  Daughter is very concerned about pt's continued GI distress and wants some diagnosis/tx of that so that pt can start eating again.  I did explain that once pt has reached medical stability, discharge is usually the next step and that pt may not be strong enough at d/c to manage even with friend's assistance.  Daughter states that Dr. Darrick Penna told her that pt would be here for probably another week and she hopes that pt will feel stronger by then.  We will try again to work with pt tomorrow.  Konrad Penta 09/13/2012, 12:45 PM

## 2012-09-13 NOTE — Progress Notes (Signed)
ANTICOAGULATION CONSULT NOTE  Pharmacy Consult for Warfarin Indication: atrial fibrillation  Allergies  Allergen Reactions  . Cardizem (Diltiazem Hcl) Itching and Rash  . Sulfonamide Derivatives Rash    Patient Measurements: Height: 5\' 5"  (165.1 cm) Weight: 128 lb 15.5 oz (58.5 kg) IBW/kg (Calculated) : 57 Vital Signs: Temp: 98.3 F (36.8 C) (06/23 0515) Temp src: Oral (06/23 0515) BP: 110/65 mmHg (06/23 0515) Pulse Rate: 121 (06/23 0515)  Labs:  Recent Labs  09/11/12 0521 09/12/12 0556 09/13/12 0445  HGB 10.2* 10.1* 10.5*  HCT 33.0* 32.7* 33.6*  PLT 191 201 214  LABPROT 36.5* 15.6* 15.0  INR 3.99* 1.27 1.20  CREATININE 0.52 0.65 0.69   Estimated Creatinine Clearance: 55.5 ml/min (by C-G formula based on Cr of 0.69).  Medical History: Past Medical History  Diagnosis Date  . Osteoporosis   . Diverticulosis   . Renal cyst   . Hyperthyroidism   . C. difficile colitis     12/06  . Lumbar and sacral arthritis   . COPD (chronic obstructive pulmonary disease)     PFT 10/05/08 - FEV1 0.89(43%), FVC 1.84(62%), FEV1% 49, TLC 5.64(120%), DLCO 38%  . Hypoxemia     Nocturnal oxygen  . Atrial fibrillation     Paroxysmal  . Sick sinus syndrome     Medtr[employer] PPM 2004  . Pulmonary nodule   . Pneumonia due to Haemophilus influenzae 03/2011    with positive blood cultures as well    Medications:  Medications Prior to Admission  Medication Sig Dispense Refill  . ALPRAZolam (XANAX) 0.25 MG tablet Take 0.25 mg by mouth at bedtime. For anxiety      . digoxin (LANOXIN) 0.125 MG tablet Take 1 tablet (125 mcg total) by mouth daily.  90 tablet  3  . fluticasone-salmeterol (ADVAIR HFA) 115-21 MCG/ACT inhaler Inhale 2 puffs into the lungs 2 (two) times daily.        . furosemide (LASIX) 40 MG tablet Take 40 mg by mouth daily as needed. Fluid retention      . metaxalone (SKELAXIN) 800 MG tablet Take 400 mg by mouth daily as needed for pain.      . metoprolol tartrate (LOPRESSOR)  25 MG tablet Take 1 tablet (25 mg total) by mouth 2 (two) times daily.  180 tablet  3  . pantoprazole (PROTONIX) 40 MG tablet Take 40 mg by mouth daily.      . raloxifene (EVISTA) 60 MG tablet Take 60 mg by mouth daily.        Marland Kitchen tiotropium (SPIRIVA) 18 MCG inhalation capsule Place 18 mcg into inhaler and inhale daily.        . verapamil (CALAN-SR) 240 MG CR tablet Take 120-240 mg by mouth 2 (two) times daily. Take 240 mg (whole tablet) in the mornings and 120 mg (1/2 tablet) in the evenings      . warfarin (COUMADIN) 5 MG tablet Take 2.5-5 mg by mouth daily. As directed by coumadin clinic 5 mg on Mon-Wed-Fri 2.5 mg on Sun- Tues- Thurs-Sat       Assessment: 77 yo F on chronic warfarin (2.5mg  daily except 5mg  on MWF) for Afib.  INR was therapeutic on admission, but continued to rise to supra-therapeutic levels likely due to acute illness, concomitant antibiotics, and poor oral intake. She was given Vit K 5mg  IV on 6/21 and INR is now at baseline.   Coumadin response will likely be blunted by administration of Vitamin K.  Pt still not eating much. No  bleeding noted.   Goal of Therapy:  INR 2-3   Plan: Warfarin 5mg  po x1 today INR/PT daily  Valrie Hart A 09/13/2012,10:00 AM

## 2012-09-13 NOTE — Progress Notes (Signed)
Called by nursing staff. Patient had several episodes of heaving and vomiting a small amount of nonbloody emesis. Patient sent her daughter to get a chocolate milk shake per nursing staff.   Discussed with Dr. Darrick Penna. Add Reglan 5mg  IV before breakfast and lunch. Phenergan 6.25mg  IV prn.

## 2012-09-13 NOTE — Progress Notes (Signed)
REVIEWED.  

## 2012-09-13 NOTE — Progress Notes (Signed)
Nutrition Follow-up   INTERVENTION:  Continue  Ensure Complete po BID, each supplement provides 350 kcal and 13 grams of protein.  D/c ProStat 30 ml q HS (100 kcal, 15 gr protein). Pt unable to tolerate taste at this time.  NUTRITION DIAGNOSIS: Inadequate oral intake; ongoing  Goal: Pt to meet >/= 90% of their estimated nutrition needs  Monitor:  Diet advancement , tolerance, po intake, labs and wt trends   77 y.o. female  Admitting Dx includes: sepsis due to pneumonia  ASSESSMENT: Pt appetite and po intake poor over the weekend (0-25%). Diet downgraded on Saturday to full liquids. Tolerating some soups. Her daughter has brought in a chocolate milkshake that she is sipping. She likes Ensure and prefers vanilla. We discussed option of adding ice cream if diarrhea continues to improve.    Height: Ht Readings from Last 1 Encounters:  09/08/12 5\' 5"  (1.651 m)    Weight: Wt Readings from Last 1 Encounters:  09/11/12 128 lb 15.5 oz (58.5 kg)    Ideal Body Weight: 125# (56.8 kg)  % Ideal Body Weight: 103%  Wt Readings from Last 10 Encounters:  09/11/12 128 lb 15.5 oz (58.5 kg)  07/26/12 127 lb (57.607 kg)  01/05/12 131 lb 6.4 oz (59.603 kg)  11/12/11 130 lb (58.968 kg)  08/06/11 126 lb (57.153 kg)  05/12/11 123 lb 6.4 oz (55.974 kg)  05/09/11 123 lb (55.792 kg)  09/30/10 112 lb (50.803 kg)  06/25/10 111 lb (50.349 kg)  05/28/10 113 lb (51.256 kg)    Usual Body Weight: 125-130# (over past year)  % Usual Body Weight: 100%  BMI:  Body mass index is 21.46 kg/(m^2).normal range  Estimated Nutritional Needs: Kcal: 1450-1740 Protein: 70-80 gr Fluid: >1700 ml/day  Skin: No issues noted  Diet Order: Full Liquid  EDUCATION NEEDS: -Education needs addressed   Intake/Output Summary (Last 24 hours) at 09/13/12 1217 Last data filed at 09/13/12 0900  Gross per 24 hour  Intake    420 ml  Output    650 ml  Net   -230 ml    Last BM: 09/13/12  Labs:   Recent  Labs Lab 09/11/12 0521 09/12/12 0556 09/13/12 0445  NA 138 138 138  K 3.3* 3.2* 3.1*  CL 101 97 94*  CO2 33* 34* 38*  BUN 8 7 8   CREATININE 0.52 0.65 0.69  CALCIUM 8.3* 8.2* 8.5  GLUCOSE 137* 98 74    CBG (last 3)  No results found for this basename: GLUCAP,  in the last 72 hours  Scheduled Meds: . digoxin  125 mcg Oral Daily  . feeding supplement  237 mL Oral QPC supper  . guaiFENesin  600 mg Oral BID  . ipratropium  0.5 mg Nebulization Q4H  . levalbuterol  0.63 mg Nebulization Q4H  . levofloxacin  750 mg Oral Daily  . metoprolol tartrate  12.5 mg Oral BID  . mometasone-formoterol  2 puff Inhalation BID  . ondansetron  4 mg Intravenous TID AC & HS  . pantoprazole  40 mg Oral BID AC  . saccharomyces boulardii  250 mg Oral BID  . verapamil  120 mg Oral QHS  . verapamil  240 mg Oral q morning - 10a  . warfarin  5 mg Oral Once  . Warfarin - Pharmacist Dosing Inpatient   Does not apply Q24H    Continuous Infusions:    Past Medical History  Diagnosis Date  . Osteoporosis   . Diverticulosis   . Renal cyst   .  Hyperthyroidism   . C. difficile colitis     12/06  . Lumbar and sacral arthritis   . COPD (chronic obstructive pulmonary disease)     PFT 10/05/08 - FEV1 0.89(43%), FVC 1.84(62%), FEV1% 49, TLC 5.64(120%), DLCO 38%  . Hypoxemia     Nocturnal oxygen  . Atrial fibrillation     Paroxysmal  . Sick sinus syndrome     Medtronic PPM 2004  . Pulmonary nodule   . Pneumonia due to Haemophilus influenzae 03/2011    with positive blood cultures as well    Past Surgical History  Procedure Laterality Date  . Total hip arthroplasty      Right  . Insert / replace / remove pacemaker      Medtronic  . Abdominal hysterectomy    . Bronchoscopy with washings  03/2011    mucus in right lower lobe with obstruction and some bleeding.  chroinc bronchitis, lung collapse,  Brushings showed numerous acute inflammatory cells and degenerated materal .  .  Esophagogastroduodenoscopy  05/23/2011    ZOX:WRUE distal esophageal erosions consistent with mild  reflux esophagitis/otherwise normal  . Colonoscopy  05/23/2011    AVW:[id number] colorectal polyps-treated/large rectal polyp likely corresponds to the area of abnormal PET activity    Royann Shivers MS,RD,LDN,CSG Office: #[phone] Pager: [phone]

## 2012-09-13 NOTE — Progress Notes (Signed)
Tana Coast, PA paged.  Returned page and notified of pt's continued nausea and has had some emesis.  Stated that she will review orders and probably order some PRN Phenergan.  Wants to also see what lab results show in the a.m.  Will continue to monitor.

## 2012-09-13 NOTE — Progress Notes (Signed)
*  PRELIMINARY RESULTS* Echocardiogram 2D Echocardiogram has been performed.  Conrad Norton 09/13/2012, 1:59 PM

## 2012-09-13 NOTE — Plan of Care (Signed)
Problem: Discharge Progression Outcomes Goal: Tolerating diet Outcome: Not Progressing Not able to tolerate diet.  Unable to advance diet.

## 2012-09-14 DIAGNOSIS — K3189 Other diseases of stomach and duodenum: Secondary | ICD-10-CM

## 2012-09-14 DIAGNOSIS — E43 Unspecified severe protein-calorie malnutrition: Secondary | ICD-10-CM

## 2012-09-14 DIAGNOSIS — R110 Nausea: Secondary | ICD-10-CM

## 2012-09-14 DIAGNOSIS — R1013 Epigastric pain: Secondary | ICD-10-CM

## 2012-09-14 LAB — BASIC METABOLIC PANEL
CO2: 36 mEq/L — ABNORMAL HIGH (ref 19–32)
Chloride: 97 mEq/L (ref 96–112)
Potassium: 3.3 mEq/L — ABNORMAL LOW (ref 3.5–5.1)
Sodium: 138 mEq/L (ref 135–145)

## 2012-09-14 LAB — IRON AND TIBC
Iron: 32 ug/dL — ABNORMAL LOW (ref 42–135)
Saturation Ratios: 21 % (ref 20–55)
TIBC: 153 ug/dL — ABNORMAL LOW (ref 250–470)

## 2012-09-14 LAB — FOLATE: Folate: 18 ng/mL

## 2012-09-14 LAB — VITAMIN B12: Vitamin B-12: 866 pg/mL (ref 211–911)

## 2012-09-14 LAB — PROTIME-INR: INR: 1.31 (ref 0.00–1.49)

## 2012-09-14 MED ORDER — METRONIDAZOLE 500 MG PO TABS
500.0000 mg | ORAL_TABLET | Freq: Three times a day (TID) | ORAL | Status: DC
  Administered 2012-09-14 – 2012-09-15 (×4): 500 mg via ORAL
  Filled 2012-09-14 (×4): qty 1

## 2012-09-14 MED ORDER — ONDANSETRON HCL 4 MG PO TABS
4.0000 mg | ORAL_TABLET | Freq: Three times a day (TID) | ORAL | Status: DC
  Administered 2012-09-14 – 2012-09-15 (×3): 4 mg via ORAL
  Filled 2012-09-14 (×3): qty 1

## 2012-09-14 MED ORDER — WARFARIN SODIUM 5 MG PO TABS
5.0000 mg | ORAL_TABLET | Freq: Once | ORAL | Status: AC
  Administered 2012-09-14: 5 mg via ORAL
  Filled 2012-09-14: qty 1

## 2012-09-14 MED ORDER — POTASSIUM CHLORIDE CRYS ER 20 MEQ PO TBCR
40.0000 meq | EXTENDED_RELEASE_TABLET | Freq: Once | ORAL | Status: AC
  Administered 2012-09-14: 40 meq via ORAL
  Filled 2012-09-14: qty 2

## 2012-09-14 MED ORDER — METOCLOPRAMIDE HCL 10 MG PO TABS
5.0000 mg | ORAL_TABLET | Freq: Two times a day (BID) | ORAL | Status: DC
  Administered 2012-09-14 – 2012-09-15 (×2): 5 mg via ORAL
  Filled 2012-09-14 (×2): qty 1

## 2012-09-14 NOTE — Progress Notes (Signed)
Patient has not urinated in over 8 hours. Patient not complaining of pain and does not feel the need to urinate. Patient has not drank or eaten much in the last few days. Doctor was notified and doctor stated to report this off to day nurse and make the day doctor aware. Will continue to monitor patient.

## 2012-09-14 NOTE — Progress Notes (Signed)
[name] ZOX:[id number] DOB: March 02, 1936 DOA: 09/08/2012 PCP: Ignatius Specking., MD   Subjective: This lady says she feels still weak but somewhat better today overall. She has not been mobilizing much.            Physical Exam: Blood pressure 113/77, pulse 113, temperature 98.1 F (36.7 C), temperature source Oral, resp. rate 20, height 5\' 5"  (1.651 m), weight 58.5 kg (128 lb 15.5 oz), SpO2 92.00%. Afebrile. Does not appear to be toxic. There is no increased work of breathing. Heart sounds are present and in atrial fibrillation, ventricular rate is somewhat increased today. Lung fields are clinically clear without any evidence of crackles, wheezing or bronchial breathing. She is alert and orientated without any focal neurological signs. She appears to be cachectic.   Investigations:  Recent Results (from the past 240 hour(s))  URINE CULTURE     Status: None   Collection Time    09/08/12  4:52 PM      Result Value Range Status   Specimen Description URINE, CATHETERIZED   Final   Special Requests NONE   Final   Culture  Setup Time 09/08/2012 17:52   Final   Colony Count NO GROWTH   Final   Culture NO GROWTH   Final   Report Status 09/09/2012 FINAL   Final  MRSA PCR SCREENING     Status: None   Collection Time    09/08/12  7:15 PM      Result Value Range Status   MRSA by PCR NEGATIVE  NEGATIVE Final   Comment:            The GeneXpert MRSA Assay (FDA     approved for NASAL specimens     only), is one component of a     comprehensive MRSA colonization     surveillance program. It is not     intended to diagnose MRSA     infection nor to guide or     monitor treatment for     MRSA infections.  CULTURE, BLOOD (ROUTINE X 2)     Status: None   Collection Time    09/08/12  7:57 PM      Result Value Range Status   Specimen Description BLOOD RIGHT HAND   Final   Special Requests BOTTLES DRAWN AEROBIC ONLY 4CC EACH   Final   Culture NO GROWTH 5 DAYS   Final   Report  Status 09/13/2012 FINAL   Final  CULTURE, BLOOD (ROUTINE X 2)     Status: None   Collection Time    09/08/12  8:09 PM      Result Value Range Status   Specimen Description BLOOD LEFT ANTECUBITAL   Final   Special Requests BOTTLES DRAWN AEROBIC AND ANAEROBIC 12CC EACH   Final   Culture NO GROWTH 5 DAYS   Final   Report Status 09/13/2012 FINAL   Final  CLOSTRIDIUM DIFFICILE BY PCR     Status: None   Collection Time    09/11/12  8:29 PM      Result Value Range Status   C difficile by pcr NEGATIVE  NEGATIVE Final     Basic Metabolic Panel:  Recent Labs  81/19/14 0445 09/14/12 0513  NA 138 138  K 3.1* 3.3*  CL 94* 97  CO2 38* 36*  GLUCOSE 74 96  BUN 8 5*  CREATININE 0.69 0.64  CALCIUM 8.5 8.6       CBC:  Recent Labs  09/12/12 0556  09/13/12 0445  WBC 7.0 6.9  HGB 10.1* 10.5*  HCT 32.7* 33.6*  MCV 91.3 90.6  PLT 201 214    US Abdomen Limited Ruq  09/12/2012   *RADIOLOGY REPORT*  Clinical Data:  Nausea and vomiting.  LIMITED ABDOMINAL ULTRASOUND - RIGHT UPPER QUADRANT  Comparison:  None.  Findings:  Gallbladder:  No shadowing gallstones or echogenic sludge.  No gallbladder wall thickening or pericholecystic fluid.  Negative sonographic Murphy's sign according to the ultrasound technologist.  Common bile duct:  Normal caliber of 5 mm.  Liver:  No focal mass lesion seen.  Within normal limits in parenchymal echogenicity.  IMPRESSION: Normal right upper quadrant ultrasound.                    Original Report Authenticated By: Irish Lack, M.D.      Medications: I have reviewed the patient's current medications.  Impression: 1. Healthcare associated pneumonia/aspiration pneumonia. Improving. On oral antibiotics now. 2. COPD and chronic respiratory failure, appears to be at her baseline in terms of oxygen requirement. 3. Atrial fibrillation with rapid ventricular response, better today.. 4. Severe protein calorie malnutrition secondary to #1 and generalized  deconditioning, and gastrointestinal issues. 5. GERD, causing significant symptoms. C. difficile toxin negative. 6. Hypokalemia.     Plan: 1. . Replete potassium. 2. Encourage mobilization and advancing diet. Physical therapy evaluation would be useful if patient agrees with participating.  Consultants:  Gastroenterology, Dr. Darrick Penna.   Procedures:  None.   Antibiotics:  Oral Levaquin started 09/12/2012.                   Code Status: Full code.  Family Communication: Discussed plan with patient at the bedside.   Disposition Plan: Depending on progress.  Time spent: 20 minutes.   LOS: 6 days   Wilson Singer Pager [phone]  09/14/2012, 9:46 AM

## 2012-09-14 NOTE — Progress Notes (Signed)
ANTICOAGULATION CONSULT NOTE  Pharmacy Consult for Warfarin Indication: atrial fibrillation  Allergies  Allergen Reactions  . Cardizem (Diltiazem Hcl) Itching and Rash  . Sulfonamide Derivatives Rash   Patient Measurements: Height: 5\' 5"  (165.1 cm) Weight: 128 lb 15.5 oz (58.5 kg) IBW/kg (Calculated) : 57 Vital Signs: Temp: 98.1 F (36.7 C) (06/24 0619) Temp src: Oral (06/24 0619) BP: 113/77 mmHg (06/24 0619) Pulse Rate: 113 (06/24 0619)  Labs:  Recent Labs  09/12/12 0556 09/13/12 0445 09/14/12 0513  HGB 10.1* 10.5*  --   HCT 32.7* 33.6*  --   PLT 201 214  --   LABPROT 15.6* 15.0 16.0*  INR 1.27 1.20 1.31  CREATININE 0.65 0.69 0.64   Estimated Creatinine Clearance: 55.5 ml/min (by C-G formula based on Cr of 0.64).  Medical History: Past Medical History  Diagnosis Date  . Osteoporosis   . Diverticulosis   . Renal cyst   . Hyperthyroidism   . C. difficile colitis     12/06  . Lumbar and sacral arthritis   . COPD (chronic obstructive pulmonary disease)     PFT 10/05/08 - FEV1 0.89(43%), FVC 1.84(62%), FEV1% 49, TLC 5.64(120%), DLCO 38%  . Hypoxemia     Nocturnal oxygen  . Atrial fibrillation     Paroxysmal  . Sick sinus syndrome     [employer] PPM 2004  . Pulmonary nodule   . Pneumonia due to Haemophilus influenzae 03/2011    with positive blood cultures as well   Medications:  Medications Prior to Admission  Medication Sig Dispense Refill  . ALPRAZolam (XANAX) 0.25 MG tablet Take 0.25 mg by mouth at bedtime. For anxiety      . digoxin (LANOXIN) 0.125 MG tablet Take 1 tablet (125 mcg total) by mouth daily.  90 tablet  3  . fluticasone-salmeterol (ADVAIR HFA) 115-21 MCG/ACT inhaler Inhale 2 puffs into the lungs 2 (two) times daily.        . furosemide (LASIX) 40 MG tablet Take 40 mg by mouth daily as needed. Fluid retention      . metaxalone (SKELAXIN) 800 MG tablet Take 400 mg by mouth daily as needed for pain.      . metoprolol tartrate (LOPRESSOR) 25 MG  tablet Take 1 tablet (25 mg total) by mouth 2 (two) times daily.  180 tablet  3  . pantoprazole (PROTONIX) 40 MG tablet Take 40 mg by mouth daily.      . raloxifene (EVISTA) 60 MG tablet Take 60 mg by mouth daily.        Marland Kitchen tiotropium (SPIRIVA) 18 MCG inhalation capsule Place 18 mcg into inhaler and inhale daily.        . verapamil (CALAN-SR) 240 MG CR tablet Take 120-240 mg by mouth 2 (two) times daily. Take 240 mg (whole tablet) in the mornings and 120 mg (1/2 tablet) in the evenings      . warfarin (COUMADIN) 5 MG tablet Take 2.5-5 mg by mouth daily. As directed by coumadin clinic 5 mg on Mon-Wed-Fri 2.5 mg on Sun- Tues- Thurs-Sat       Assessment: 77 yo F on chronic warfarin (2.5mg  daily except 5mg  on MWF) for Afib.  INR was therapeutic on admission, but continued to rise to supra-therapeutic levels likely due to acute illness, concomitant antibiotics, and poor oral intake. She was given Vit K 5mg  IV on 6/21 and INR is now below goal and is sluggish to recover despite restarting Coumadin but is now rising slowly.  Pt still  not eating much.  No bleeding noted.   Goal of Therapy:  INR 2-3   Plan: Warfarin 5mg  po x1 today INR/PT daily  Annelle Behrendt A 09/14/2012,10:48 AM

## 2012-09-14 NOTE — Progress Notes (Signed)
REVIEWED. AGREE. 

## 2012-09-14 NOTE — Progress Notes (Signed)
Pt ambulating in hall.  Tolerating well with RW and on 4L O2.

## 2012-09-14 NOTE — Progress Notes (Signed)
UR chart review completed.  

## 2012-09-14 NOTE — Progress Notes (Signed)
Subjective: Ate some jello. Tolerating liquids. "If I could sleep, I could eat". No loose stools overnight. No abdominal pain. States abdomen feels bloated. No heaving and gagging overnight.   Objective: Vital signs in last 24 hours: Temp:  [98.1 F (36.7 C)-98.5 F (36.9 C)] 98.1 F (36.7 C) (06/24 0619) Pulse Rate:  [98-122] 113 (06/24 0619) Resp:  [20] 20 (06/24 0619) BP: (110-114)/(65-77) 113/77 mmHg (06/24 0619) SpO2:  [90 %-95 %] 92 % (06/24 0702) FiO2 (%):  [93 %] 93 % (06/23 1125) Last BM Date: 09/13/12 General:   Alert and oriented, pleasant Head:  Normocephalic and atraumatic. Eyes:  No icterus, sclera clear. Conjuctiva pink.  Heart:  S1, S2 present Lungs: Clear to auscultation bilaterally, without wheezing, rales, or rhonchi.  Abdomen:  Bowel sounds present, soft, non-tender, non-distended. No HSM or hernias noted. No rebound or guarding. No masses appreciated  Msk:  Symmetrical without gross deformities. Normal posture. Extremities:  Without edema. Neurologic:  Alert and  oriented x4;  grossly normal neurologically. Skin:  Warm and dry, intact without significant lesions.  Psych:  Alert and cooperative. Normal mood and affect.  Intake/Output from previous day: 06/23 0701 - 06/24 0700 In: 60 [P.O.:60] Out: -  Intake/Output this shift:    Lab Results:  Recent Labs  09/12/12 0556 09/13/12 0445  WBC 7.0 6.9  HGB 10.1* 10.5*  HCT 32.7* 33.6*  PLT 201 214   BMET  Recent Labs  09/12/12 0556 09/13/12 0445 09/14/12 0513  NA 138 138 138  K 3.2* 3.1* 3.3*  CL 97 94* 97  CO2 34* 38* 36*  GLUCOSE 98 74 96  BUN 7 8 5*  CREATININE 0.65 0.69 0.64  CALCIUM 8.2* 8.5 8.6   PT/INR  Recent Labs  09/13/12 0445 09/14/12 0513  LABPROT 15.0 16.0*  INR 1.20 1.31    Studies/Results: US Abdomen Limited Ruq  09/12/2012   *RADIOLOGY REPORT*  Clinical Data:  Nausea and vomiting.  LIMITED ABDOMINAL ULTRASOUND - RIGHT UPPER QUADRANT  Comparison:  None.  Findings:   Gallbladder:  No shadowing gallstones or echogenic sludge.  No gallbladder wall thickening or pericholecystic fluid.  Negative sonographic Murphy's sign according to the ultrasound technologist.  Common bile duct:  Normal caliber of 5 mm.  Liver:  No focal mass lesion seen.  Within normal limits in parenchymal echogenicity.  IMPRESSION: Normal right upper quadrant ultrasound.                    Original Report Authenticated By: Irish Lack, M.D.    Assessment: 77 year old female admitted with fall and increasing weakness. Multiple comorbidities, with GI complaints of dyspepsia, gagging, heaving, loose stools. EGD up-to-date as of last year. Clinically improved since admission, with no loose stools, vomiting since yesterday. Tolerating Reglan. Will add Flagyl for prophylactic purposes due to history of Cdiff and exposure to antibiotics currently. Continue low dose Reglan for now; increase diet to soft mechanical for lunch. Cortisol and anemia panel are pending.   Plan: PPI BID Follow-up on pending cortisol level Follow-up on anemia panel Add Flagyl Continue low-dose Reglan for now, monitoring for adverse side effects Increase diet to soft mechanical diet   Nira Retort, ANP-BC [facility]  8:29 AM   LOS: 6 days    09/14/2012, 7:54 AM

## 2012-09-14 NOTE — Progress Notes (Signed)
Physical Therapy Treatment Patient Details Name: [name] MRN: [id number] DOB: December 09, 1935 Today's Date: 09/14/2012 Time: 7829-5621 PT Time Calculation (min): 35 min  PT Assessment / Plan / Recommendation Comments on Treatment Session  Pt has improved immensely since initial evaluation.  She is now on 4 L O2  and has no dyspnea at rest.  She states that she ambulated in the hallway with nursing service and hand held assistance, but felt a bit unstable.  She was agreeable to work with me this afternoon (even though it had only been 30 minutes since she walked).  She needed only minimal assistance to mobilize OOB and was able to ambulate 27' with a rolling walker, stable gait pattern.  O2 sat = 85%  after gait but it rebounded to 93% within a minute.  Pt was then able to do sit to stand exercise for 5 repetitions.  She should definately be able to transition to home at d/c.  Her daughter is currently on vacation and will be with her full time and they will have  friends/family stay with her after that.   She and daughter were instructed in some energy conservation methods.         Follow Up Recommendations        Does the patient have the potential to tolerate intense rehabilitation     Barriers to Discharge        Equipment Recommendations  None recommended by PT    Recommendations for Other Services    Frequency     Plan Discharge plan remains appropriate;Frequency remains appropriate    Precautions / Restrictions Precautions Precautions: Fall Restrictions Weight Bearing Restrictions: No   Pertinent Vitals/Pain     Mobility  Bed Mobility Rolling Right: 6: Modified independent (Device/Increase time) Right Sidelying to Sit: Not tested (comment) Supine to Sit: 5: Supervision;HOB elevated Sit to Supine: Not Tested (comment) Transfers Sit to Stand: 4: Min assist;With upper extremity assist;From bed Stand Pivot Transfers: 5: Supervision Details for Transfer Assistance: Pt  is able to stand from sitting independently from an elevated surface Ambulation/Gait Ambulation/Gait Assistance: 5: Supervision Ambulation Distance (Feet): 80 Feet Assistive device: Rolling walker Ambulation/Gait Assistance Details: gait with a walker is stable Gait Pattern: Within Functional Limits Gait velocity: WNL General Gait Details: O2 at 4 L/min Stairs: No Wheelchair Mobility Wheelchair Mobility: No    Exercises Other Exercises Other Exercises: sit to stand , 5 repetitions   PT Diagnosis:    PT Problem List:   PT Treatment Interventions:     PT Goals Acute Rehab PT Goals PT Goal: Rolling Supine to Right Side - Progress: Met PT Goal: Supine/Side to Sit - Progress: Progressing toward goal PT Goal: Sit to Stand - Progress: Progressing toward goal PT Transfer Goal: Bed to Chair/Chair to Bed - Progress: Progressing toward goal Pt will Ambulate: 51 - 150 feet;with supervision PT Goal: Ambulate - Progress: Updated due to goal met  Visit Information  Last PT Received On: 09/14/12    Subjective Data  Subjective: Pt states that she is feeling MUCH better and is able to eat a little bit of solid food Patient Stated Goal: wants to get home   Cognition  Cognition Arousal/Alertness: Awake/alert Behavior During Therapy: WFL for tasks assessed/performed Overall Cognitive Status: Within Functional Limits for tasks assessed    Balance     End of Session PT - End of Session Equipment Utilized During Treatment: Gait belt;Oxygen Activity Tolerance: Patient tolerated treatment well Patient left: in bed;with call  bell/phone within reach;with family/visitor present   GP     Konrad Penta 09/14/2012, 3:18 PM

## 2012-09-15 ENCOUNTER — Telehealth: Payer: Self-pay | Admitting: Gastroenterology

## 2012-09-15 DIAGNOSIS — Z7901 Long term (current) use of anticoagulants: Secondary | ICD-10-CM

## 2012-09-15 MED ORDER — PANTOPRAZOLE SODIUM 40 MG PO TBEC: 40.0000 mg | DELAYED_RELEASE_TABLET | Freq: Two times a day (BID) | ORAL | Status: DC

## 2012-09-15 MED ORDER — LEVOFLOXACIN 750 MG PO TABS: 750.0000 mg | ORAL_TABLET | Freq: Every day | ORAL | Status: DC

## 2012-09-15 MED ORDER — METRONIDAZOLE 500 MG PO TABS: 500.0000 mg | ORAL_TABLET | Freq: Three times a day (TID) | ORAL | Status: DC

## 2012-09-15 MED ORDER — ONDANSETRON HCL 4 MG PO TABS: 4.0000 mg | ORAL_TABLET | Freq: Three times a day (TID) | ORAL | Status: DC

## 2012-09-15 MED ORDER — METOPROLOL TARTRATE 12.5 MG HALF TABLET: 12.5000 mg | ORAL_TABLET | Freq: Two times a day (BID) | ORAL | Status: DC

## 2012-09-15 MED ORDER — SACCHAROMYCES BOULARDII 250 MG PO CAPS: 250.0000 mg | ORAL_CAPSULE | Freq: Two times a day (BID) | ORAL | Status: DC

## 2012-09-15 MED ORDER — METOCLOPRAMIDE HCL 5 MG PO TABS: 5.0000 mg | ORAL_TABLET | Freq: Two times a day (BID) | ORAL | Status: DC

## 2012-09-15 NOTE — Progress Notes (Addendum)
Subjective: Ate a few bites of chicken pot pie yesterday. No further nausea or vomiting. No diarrhea.   Objective: Vital signs in last 24 hours: Temp:  [97.8 F (36.6 C)-98.8 F (37.1 C)] 98.2 F (36.8 C) (06/25 0604) Pulse Rate:  [78-102] 90 (06/25 0604) Resp:  [20] 20 (06/25 0604) BP: (100-119)/(60-69) 105/60 mmHg (06/25 0604) SpO2:  [92 %-99 %] 93 % (06/25 0702) Weight:  [127 lb 13.9 oz (58 kg)] 127 lb 13.9 oz (58 kg) (06/25 0411) Last BM Date: 09/14/12 General:   Alert and oriented, pleasan Heart:  S1, S2 present, no murmurs noted.  Abdomen:  Bowel sounds present, soft, non-tender, non-distended. No HSM or hernias noted. No rebound or guarding. No masses appreciated  Extremities:  Without clubbing or edema. Neurologic:  Alert and  oriented x4;  grossly normal neurologically. Psych:  Alert and cooperative. Normal mood and affect.  Intake/Output from previous day: 06/24 0701 - 06/25 0700 In: 340 [P.O.:340] Out: 300 [Urine:300] Intake/Output this shift:    Lab Results:  Recent Labs  09/13/12 0445  WBC 6.9  HGB 10.5*  HCT 33.6*  PLT 214   BMET  Recent Labs  09/13/12 0445 09/14/12 0513  NA 138 138  K 3.1* 3.3*  CL 94* 97  CO2 38* 36*  GLUCOSE 74 96  BUN 8 5*  CREATININE 0.69 0.64  CALCIUM 8.5 8.6   PT/INR  Recent Labs  09/14/12 0513 09/15/12 0516  LABPROT 16.0* 17.5*  INR 1.31 1.48   Lab Results  Component Value Date   IRON 32* 09/14/2012   TIBC 153* 09/14/2012   FERRITIN 176 09/14/2012   Cortisol: 2.2 (4.3-22.4)  Assessment: 77 year old female admitted with fall and increasing weakness. Multiple comorbidities, with GI complaints of dyspepsia, gagging, heaving, loose stools. EGD up-to-date as of last year. Continues to improve from a GI standpoint, with hopeful discharge in the near future. Flagyl on board for prophylactic purposes due to history of Cdiff and current exposure to antibiotics.  Anemia panel reveals low iron, normal ferritin. Mixed  pattern anemia. Due to history of adenomatous polyps on colonoscopy last year, recommendations were made for surveillance in 6 months; somewhat overdue now due to hospitalizations. Needs outpatient colonoscopy.  Fasting am cortisol level low; needs endocrine evaluation.    Plan: Outpatient colonoscopy/EGD TO EVALUATE DYSPEPSIA AND FOR COLON POLYP SURVEILLANCE.  Appropriate for discharge from a GI standpoint Follow-up with [facility] in 2-3 weeks Outpatient endocrine evaluation CONTINUE FLAGYL 10 DAYS AFTER LEVAQUIN STOPPED. CONTINUE FLORASTOR FOR ONE MO. REGLAN BID.    Nira Retort, ANP-BC [facility]  11:10 AM   LOS: 7 days    09/15/2012, 7:39 AM

## 2012-09-15 NOTE — Telephone Encounter (Signed)
Waiting to schedule with Dr. Fransico Him, new office opens July 1st to schedule appointment

## 2012-09-15 NOTE — Telephone Encounter (Signed)
Patient discharged from [facility] today. Please make referral for Endocrine due to low fasting cortisol level.

## 2012-09-15 NOTE — Progress Notes (Signed)
UR chart review completed.  

## 2012-09-15 NOTE — Discharge Summary (Signed)
Physician Discharge Summary  [name]:[id number] DOB: 04-12-1935 DOA: 09/08/2012  PCP: Ignatius Specking., MD  Admit date: 09/08/2012 Discharge date: 09/15/2012  Time spent: Greater than 30 minutes  Recommendations for Outpatient Follow-up:  1. Follow with Dr. Kendell Bane, gastroenterology in 2 weeks' time.   Discharge Diagnoses:  1. Health care associated pneumonia, improving. 2. COPD with chronic respiratory failure, oxygen dependent, at baseline. 3. Atrial fibrillation with controlled ventricular response. 4. Severe protein calorie malnutrition secondary to #1 and generalized deconditioning and gastrointestinal issues. 5. GERD causing significant symptoms. C. difficile toxin negative.   Discharge Condition: Stable and improving.  Diet recommendation: Regular, small meals.  Filed Weights   09/10/12 0500 09/11/12 0500 09/15/12 0411  Weight: 58 kg (127 lb 13.9 oz) 58.5 kg (128 lb 15.5 oz) 58 kg (127 lb 13.9 oz)    History of present illness:  This very pleasant 26 lady presented to the hospital with symptoms of weakness. Please see initial history as outlined below: HPI: [name] is a 77 y.o. female who has oxygen-dependent COPD, chronic atrial fibrillation, sick sinus syndrome status post pacemaker and multiple other medical problems. Patient was brought to the emergency room today when she had a fall. She reported feeling increasingly weak today to the point that she had difficulty getting up and walking. She did manage to stand and walk, but felt so weak and dizzy that she ended up falling. She denied any loss of consciousness or head trauma. Denies a chest pain. She reports that she was experiencing chills overnight. She has not noticed any change in her chronic cough, although she has experienced worsening shortness of breath over the past few days. She denies any sick contacts. She has had vomiting and dry heaves. She's not had any diarrhea. Her by mouth intake has  been poor over the past several days. She was recently seen at [facility] approximately 2 weeks ago for a possible urinary tract infection. She was felt to be dehydrated at that time as well. They did send her home on Keflex. She completed her antibiotic course but reports that she still felt poorly. She was evaluated in the emergency room where she was noted to be febrile, tachycardic and hypotensive. Imaging reveals a developing pneumonia. She's been referred for admission.  Hospital Course:  The patient was admitted and was found to have a septic picture segment pneumonia. She was treated appropriately with intravenous antibiotics and did improve. She was switched to oral antibiotics. She also had atrial fibrillation with rapid ventricular response and medications were adjusted to control this. She was anticoagulated with Coumadin. She was seen by gastroenterology for her nausea. It was felt this was likely due to significant GERD and her Protonix was increased. She was also given metoclopramide. This has improved her symptoms significant. She is severely deconditioned and had intensive physical therapy in the hospital. She has done well with this and was able to mobilize much better yesterday. In fact physical therapy felt that no further followup was needed as she was now much improved. She is now stable for discharge but will need followup with gastroenterology in the next couple weeks. Interestingly, her morning cortisol level is decreased. This may be playing a part in her symptomatology. This can be investigated further as an outpatient and I would suggest she needs an ACTH stimulation test.  Procedures:  Echocardiogram:  Study Conclusions  - Left ventricle: The cavity size was normal. There was mild concentric hypertrophy. Systolic function was  normal. The estimated ejection fraction was in the range of 60% to 65%. Wall motion was normal; there were no regional wall motion  abnormalities. The study is not technically sufficient to allow evaluation of LV diastolic function. Unable to compare with most recent study from January 2013, although based on report there has been no significant change in LVEF. - Mitral valve: Trivial regurgitation. - Right ventricle: Pacer wire or catheter noted in right ventricle. - Right atrium: Central venous pressure: 3mm Hg (est). - Tricuspid valve: Mild regurgitation. - Pulmonary arteries: Systolic pressure was mildly increased. PA peak pressure: 31mm Hg (S). - Pericardium, extracardiac: A prominent pericardial fat pad was present.   Consultations:  Dr Rourk/Dr Darrick Penna, gastroenterology.  Discharge Exam: Filed Vitals:   09/15/12 0411 09/15/12 0421 09/15/12 0604 09/15/12 0702  BP:   105/60   Pulse:   90   Temp:   98.2 F (36.8 C)   TempSrc:   Oral   Resp:   20   Height:      Weight: 58 kg (127 lb 13.9 oz)     SpO2:  92% 94% 93%    General: She looks acutely well systemically. She is chronically malnourished. Cardiovascular: Atrial fibrillation. No evidence of heart failure clinically. Respiratory: Lung fields are essentially clear now. There are a few crackles at both bases. She is alert and orientated.  Discharge Instructions  Discharge Orders   Future Appointments Provider Department Dept Phone   11/03/2012 1:20 PM Jonelle Sidle, MD [facility] [place] [phone]   Future Orders Complete By Expires     Diet - low sodium heart healthy  As directed     Increase activity slowly  As directed         Medication List    TAKE these medications       ALPRAZolam 0.25 MG tablet  Commonly known as:  XANAX  Take 0.25 mg by mouth at bedtime. For anxiety     digoxin 0.125 MG tablet  Commonly known as:  LANOXIN  Take 1 tablet (125 mcg total) by mouth daily.     fluticasone-salmeterol 115-21 MCG/ACT inhaler  Commonly known as:  ADVAIR HFA  Inhale 2 puffs into the lungs 2 (two) times daily.      furosemide 40 MG tablet  Commonly known as:  LASIX  Take 40 mg by mouth daily as needed. Fluid retention     levofloxacin 750 MG tablet  Commonly known as:  LEVAQUIN  Take 1 tablet (750 mg total) by mouth daily.     metaxalone 800 MG tablet  Commonly known as:  SKELAXIN  Take 400 mg by mouth daily as needed for pain.     metoCLOPramide 5 MG tablet  Commonly known as:  REGLAN  Take 1 tablet (5 mg total) by mouth 2 (two) times daily before a meal.     metoprolol tartrate 12.5 mg Tabs  Commonly known as:  LOPRESSOR  Take 0.5 tablets (12.5 mg total) by mouth 2 (two) times daily.     metroNIDAZOLE 500 MG tablet  Commonly known as:  FLAGYL  Take 1 tablet (500 mg total) by mouth every 8 (eight) hours.     ondansetron 4 MG tablet  Commonly known as:  ZOFRAN  Take 1 tablet (4 mg total) by mouth 4 (four) times daily -  before meals and at bedtime.     pantoprazole 40 MG tablet  Commonly known as:  PROTONIX  Take 1 tablet (40 mg total)  by mouth 2 (two) times daily before a meal.     raloxifene 60 MG tablet  Commonly known as:  EVISTA  Take 60 mg by mouth daily.     saccharomyces boulardii 250 MG capsule  Commonly known as:  FLORASTOR  Take 1 capsule (250 mg total) by mouth 2 (two) times daily.     tiotropium 18 MCG inhalation capsule  Commonly known as:  SPIRIVA  Place 18 mcg into inhaler and inhale daily.     verapamil 240 MG CR tablet  Commonly known as:  CALAN-SR  Take 120-240 mg by mouth 2 (two) times daily. Take 240 mg (whole tablet) in the mornings and 120 mg (1/2 tablet) in the evenings     warfarin 5 MG tablet  Commonly known as:  COUMADIN  Take 2.5-5 mg by mouth daily. As directed by coumadin clinic  5 mg on Mon-Wed-Fri  2.5 mg on Sun- Tues- Thurs-Sat       Allergies  Allergen Reactions  . Cardizem (Diltiazem Hcl) Itching and Rash  . Sulfonamide Derivatives Rash       Follow-up Information   Follow up with [facility] In 2 days.   Contact  information:   [address] [place] [address] [phone]      Follow up with Eula Listen, MD. Schedule an appointment as soon as possible for a visit in 2 weeks.   Contact information:   [address] [place] [address] [phone]        The results of significant diagnostics from this hospitalization (including imaging, microbiology, ancillary and laboratory) are listed below for reference.    Significant Diagnostic Studies: Dg Ribs Unilateral W/chest Right  09/08/2012   *RADIOLOGY REPORT*  Clinical Data: Right posterior chest pain secondary to a fall this morning.  RIGHT RIBS AND CHEST - 3+ VIEW  Comparison: Chest x-ray dated 11/14/2011 and chest CT dated 04/09/2012  Findings: There is an old healed fracture of the anterior lateral aspect of the right eighth rib.  No acute rib fractures.  However, the patient does have a new patchy infiltrate in the right midzone superimposed on severe emphysematous disease.  Heart size and pulmonary vascularity is normal.  Dual lead pacer in place.  IMPRESSION:  1.  No acute rib fractures. 2.  New patchy infiltrate in the right midzone superimposed on severe emphysema.  The possibility of aspiration pneumonitis should be considered.   Original Report Authenticated By: Francene Boyers, M.D.   Dg Pelvis 1-2 Views  09/08/2012   *RADIOLOGY REPORT*  Clinical Data: Pelvis pain secondary to a fall this morning.  PELVIS - 1-2 VIEW  Comparison: Radiographs of 05/18/2007 and 04/07/2007  Findings: There is no acute fracture or dislocation.  Right total hip prosthesis in place.  Avascular necrosis of the left femoral head without collapse of the articular surface.  IMPRESSION: No acute abnormality.   Original Report Authenticated By: Francene Boyers, M.D.   Ct Head Wo Contrast  09/08/2012   *RADIOLOGY REPORT*  Clinical Data: Altered level of consciousness secondary to a fall today.  CT HEAD WITHOUT CONTRAST  Technique:   Contiguous axial images were obtained from the base of the skull through the vertex without contrast.  Comparison: None.  Findings: There is no acute intracranial hemorrhage or mass lesion. There are vague areas of lucency in the basal ganglia bilaterally consistent with white matter infarcts, age indeterminate.  There is diffuse slight cerebral cortical and  cerebellar atrophy.  No acute osseous abnormality.  Tiny osteoma in the right side of the frontal sinus.  IMPRESSION: Small white matter infarcts in both basal ganglia.  Atrophy. No hemorrhage.   Original Report Authenticated By: Francene Boyers, M.D.   Ct Cervical Spine Wo Contrast  09/08/2012   *RADIOLOGY REPORT*  Clinical Data: Altered level of consciousness and neck pain secondary to a fall today.  CT CERVICAL SPINE WITHOUT CONTRAST  Technique:  Multidetector CT imaging of the cervical spine was performed. Multiplanar CT image reconstructions were also generated.  Comparison: CT scan dated 01/03/2011  Findings: There is no fracture or prevertebral soft tissue swelling.  There is a 3 mm spondylolisthesis of C3 on C4.  This is due to facet arthritis at C3-4. 1.3 mm subluxation at C4-5 is new since the prior exam.  There is also moderate left facet arthritis at C2-3 and there is degenerative disc disease at C6-7 with disc space narrowing and sclerosis and erosions of the endplates, unchanged.  IMPRESSION: No significant abnormalities.   Original Report Authenticated By: Francene Boyers, M.D.   Dg Chest Port 1 View  09/11/2012   *RADIOLOGY REPORT*  Clinical Data: Shortness of breath.  PORTABLE CHEST - 1 VIEW  Comparison: 09/10/2012.  Findings: The cardiac silhouette, mediastinal and hilar contours are stable.  The pacer wires are stable.  Stable underlying emphysematous changes with worsening bilateral airspace process, right greater than left.  This is likely infiltrates.  Persistent right pleural effusion.  IMPRESSION: Worsening bilateral airspace process,  right greater than left.   Original Report Authenticated By: Rudie Meyer, M.D.   Dg Chest Port 1 View  09/10/2012   *RADIOLOGY REPORT*  Clinical Data: Short of breath, nausea and vomiting  PORTABLE CHEST - 1 VIEW  Comparison: Prior chest x-ray 09/08/2012  Findings: Stable position of left subclavian approach cardiac rhythm maintenance device with leads projecting over the right atrium and right ventricle.  Stable cardiomegaly.  Atherosclerotic calcifications noted in the transverse aorta.  Similar appearance of diffuse patchy airspace disease in the right mid and lower lobe. Probable small right parapneumonic effusion.  Slightly increased linear opacities in the left lung base. Background emphysema and COPD.  IMPRESSION:  1.  Similar appearance of diffuse patchy airspace disease in the right mid and lower lobe compared to 09/08/2012. Imaging appearance is most suggestive of pneumonia. 2.  Small right pleural effusion, possibly parapneumonic. 3.  Slightly increased linear opacities in the left lung base favored to reflect atelectasis.  Early developing infiltrate is difficult to exclude.   Original Report Authenticated By: Malachy Moan, M.D.   Dg Abd Portable 1v  09/10/2012   *RADIOLOGY REPORT*  Clinical Data: Nausea, vomiting  PORTABLE ABDOMEN - 1 VIEW  Comparison: Prior radiographs of the pelvis 09/08/2012; prior CT abdomen/pelvis 08/24/2012  Findings: Unremarkable, nonobstructed bowel gas pattern. Incompletely imaged right total hip arthroplasty.  No hardware complication of the visualized portions.  The bones appear osteopenic.  There is rotary dextroconvex scoliosis of the lumbar spine.  Scattered atherosclerotic vascular calcifications are noted.  Probable cardiomegaly.  IMPRESSION:  1.  Nonobstructed bowel gas pattern. 2.  Incompletely imaged cardiomegaly. 3.  Dextroconvex rotary lumbar scoliosis.   Original Report Authenticated By: Malachy Moan, M.D.   US Abdomen Limited Ruq  09/12/2012    *RADIOLOGY REPORT*  Clinical Data:  Nausea and vomiting.  LIMITED ABDOMINAL ULTRASOUND - RIGHT UPPER QUADRANT  Comparison:  None.  Findings:  Gallbladder:  No shadowing gallstones or echogenic sludge.  No  gallbladder wall thickening or pericholecystic fluid.  Negative sonographic Murphy's sign according to the ultrasound technologist.  Common bile duct:  Normal caliber of 5 mm.  Liver:  No focal mass lesion seen.  Within normal limits in parenchymal echogenicity.  IMPRESSION: Normal right upper quadrant ultrasound.                    Original Report Authenticated By: Irish Lack, M.D.    Microbiology: Recent Results (from the past 240 hour(s))  URINE CULTURE     Status: None   Collection Time    09/08/12  4:52 PM      Result Value Range Status   Specimen Description URINE, CATHETERIZED   Final   Special Requests NONE   Final   Culture  Setup Time 09/08/2012 17:52   Final   Colony Count NO GROWTH   Final   Culture NO GROWTH   Final   Report Status 09/09/2012 FINAL   Final  MRSA PCR SCREENING     Status: None   Collection Time    09/08/12  7:15 PM      Result Value Range Status   MRSA by PCR NEGATIVE  NEGATIVE Final   Comment:            The GeneXpert MRSA Assay (FDA     approved for NASAL specimens     only), is one component of a     comprehensive MRSA colonization     surveillance program. It is not     intended to diagnose MRSA     infection nor to guide or     monitor treatment for     MRSA infections.  CULTURE, BLOOD (ROUTINE X 2)     Status: None   Collection Time    09/08/12  7:57 PM      Result Value Range Status   Specimen Description BLOOD RIGHT HAND   Final   Special Requests BOTTLES DRAWN AEROBIC ONLY 4CC EACH   Final   Culture NO GROWTH 5 DAYS   Final   Report Status 09/13/2012 FINAL   Final  CULTURE, BLOOD (ROUTINE X 2)     Status: None   Collection Time    09/08/12  8:09 PM      Result Value Range Status   Specimen Description BLOOD LEFT ANTECUBITAL   Final    Special Requests BOTTLES DRAWN AEROBIC AND ANAEROBIC 12CC EACH   Final   Culture NO GROWTH 5 DAYS   Final   Report Status 09/13/2012 FINAL   Final  CLOSTRIDIUM DIFFICILE BY PCR     Status: None   Collection Time    09/11/12  8:29 PM      Result Value Range Status   C difficile by pcr NEGATIVE  NEGATIVE Final     Labs: Basic Metabolic Panel:  Recent Labs Lab 09/10/12 0431 09/11/12 0521 09/12/12 0556 09/13/12 0445 09/14/12 0513  NA 139 138 138 138 138  K 3.3* 3.3* 3.2* 3.1* 3.3*  CL 101 101 97 94* 97  CO2 32 33* 34* 38* 36*  GLUCOSE 93 137* 98 74 96  BUN 7 8 7 8  5*  CREATININE 0.58 0.52 0.65 0.69 0.64  CALCIUM 8.5 8.3* 8.2* 8.5 8.6   Liver Function Tests:  Recent Labs Lab 09/09/12 0457  AST 14  ALT 6  ALKPHOS 88  BILITOT 1.2  PROT 4.9*  ALBUMIN 2.1*    Recent Labs Lab 09/10/12 0431  LIPASE 9*  CBC:  Recent Labs Lab 09/08/12 1450 09/09/12 0457 09/10/12 0431 09/11/12 0521 09/12/12 0556 09/13/12 0445  WBC 21.0* 14.0* 8.4 7.4 7.0 6.9  NEUTROABS 18.6*  --   --   --   --   --   HGB 12.0 10.6* 10.1* 10.2* 10.1* 10.5*  HCT 37.6 32.5* 32.3* 33.0* 32.7* 33.6*  MCV 90.0 90.5 90.7 91.2 91.3 90.6  PLT 228 207 204 191 201 214   Cardiac Enzymes:  Recent Labs Lab 09/08/12 1450  TROPONINI <0.30   BNP: BNP (last 3 results)  Recent Labs  09/11/12 1031  PROBNP 2403.0*   CBG: No results found for this basename: GLUCAP,  in the last 168 hours     Signed:  GOSRANI,NIMISH C  [facility] 09/15/2012, 9:42 AM

## 2012-09-15 NOTE — Progress Notes (Signed)
AVS reviewed with patient and patient's spouse.  Verbalized understanding of d/c orders.  Educated on which medications are due for the remainder of the day.  Teach back method used.  Pt's IV removed.  Site WNL.  Pt reports all belongings intact and in possession at time of discharge.  Pt's daughter brought home O2 tank for transport home.  Pt transported by NT via w/c to main entrance for discharge.  Pt stable at time of discharge.

## 2012-09-16 ENCOUNTER — Telehealth: Payer: Self-pay | Admitting: Gastroenterology

## 2012-09-16 NOTE — Telephone Encounter (Signed)
    Per Dr. Darrick Penna:  Continue Reglan BID Continue Flagyl for 10 days after Levaquin has stopped Continue Florastor for one month Needs endocrine eval (I already sent phone note regarding this) Return to clinic in 2-3 weeks to discuss TCS/EGD    I attempted to call patient and inform. Left message.  If patient returns call, please make sure she and her family know.  Needs to see [place] in 2-3 weeks.

## 2012-09-17 NOTE — Telephone Encounter (Signed)
Tried to call pt- LMOM 

## 2012-09-20 NOTE — Telephone Encounter (Signed)
Tried to call pt- LMOM 

## 2012-09-21 HISTORY — PX: ESOPHAGOGASTRODUODENOSCOPY: SHX1529

## 2012-09-21 NOTE — Telephone Encounter (Signed)
Unable to reach pt- mailed letter with instructions.

## 2012-09-21 NOTE — Telephone Encounter (Signed)
I called Dr. Dennie Bible office  to schedule today July 1st and now they are saying July 14th should I refer her to [facility], Please advise?

## 2012-09-21 NOTE — Telephone Encounter (Signed)
It's ok to do July 14.

## 2012-09-21 NOTE — ED Provider Notes (Signed)
Medical screening examination/treatment/procedure(s) were performed by non-physician practitioner and as supervising physician I was immediately available for consultation/collaboration.   Tynlee Bayle M Marinell Igarashi, DO 09/21/12 1309 

## 2012-09-27 ENCOUNTER — Telehealth: Payer: Self-pay | Admitting: Internal Medicine

## 2012-09-27 NOTE — Telephone Encounter (Signed)
Great. From a GI standpoint, she was clear when I last saw her. "released" by hospitalist.   Let's get any op notes from [facility] for our file.

## 2012-09-27 NOTE — Telephone Encounter (Signed)
Pt's daughter called to cancel OV for Thursday this week. She wanted [place] to know that [facility] had released patient from hospital on a Friday and was to be set up for a procedure in several weeks. She also said that patient was re-admitted to hospital on Saturday at [facility] and has already had her procedure done. Daughter was rather sarcastic when telling me this, but I said that I would cancel OV and document in her chart that patient has had procedure done.

## 2012-09-28 NOTE — Telephone Encounter (Signed)
Pt's daughter called yesterday to cancel OV and to let us know that patient had been admitted to [facility] and procedure was done there. I retrieved records and [facility] is aware.

## 2012-09-28 NOTE — Telephone Encounter (Signed)
Requested records from [place] at [facility] medical records

## 2012-09-30 ENCOUNTER — Telehealth: Payer: Self-pay | Admitting: *Deleted

## 2012-09-30 ENCOUNTER — Ambulatory Visit: Payer: Medicare Other | Admitting: Gastroenterology

## 2012-09-30 NOTE — Telephone Encounter (Signed)
Noted pt had echo performed as advised

## 2012-10-04 ENCOUNTER — Other Ambulatory Visit: Payer: Self-pay | Admitting: Gastroenterology

## 2012-10-04 DIAGNOSIS — E43 Unspecified severe protein-calorie malnutrition: Secondary | ICD-10-CM

## 2012-10-04 DIAGNOSIS — E876 Hypokalemia: Secondary | ICD-10-CM

## 2012-10-04 NOTE — Telephone Encounter (Signed)
Referral has been faxed to Dr. Nida 

## 2012-10-19 NOTE — Progress Notes (Signed)
Received Referral back from [facility] at Dr. Dennie Bible office and it states that Mrs.Asselin states that she does not need the referral.

## 2012-11-03 ENCOUNTER — Ambulatory Visit: Payer: Medicare Other | Admitting: Cardiology

## 2012-11-09 ENCOUNTER — Ambulatory Visit (INDEPENDENT_AMBULATORY_CARE_PROVIDER_SITE_OTHER): Payer: Medicare Other | Admitting: Cardiology

## 2012-11-09 ENCOUNTER — Encounter: Payer: Self-pay | Admitting: Cardiology

## 2012-11-09 VITALS — BP 109/61 | HR 90 | Ht 65.0 in | Wt 126.4 lb

## 2012-11-09 DIAGNOSIS — I4891 Unspecified atrial fibrillation: Secondary | ICD-10-CM

## 2012-11-09 DIAGNOSIS — I495 Sick sinus syndrome: Secondary | ICD-10-CM

## 2012-11-09 DIAGNOSIS — I4821 Permanent atrial fibrillation: Secondary | ICD-10-CM

## 2012-11-09 MED ORDER — DIGOXIN 125 MCG PO TABS: 0.1250 mg | ORAL_TABLET | Freq: Every day | ORAL | Status: DC

## 2012-11-09 NOTE — Patient Instructions (Addendum)
Your physician recommends that you schedule a follow-up appointment in: 3 MONTHS WITH DR [facility]  Your physician has requested that you regularly monitor and record your blood pressure readings at home AS WELL AS YOUR HEART RATE Please use the same machine at the same time of day to check your readings and record them to bring to your follow-up visit.BRING READINGS INTO THE OFFICE IN 2 WEEKS

## 2012-11-09 NOTE — Assessment & Plan Note (Signed)
Continue current rate control regimen plus Coumadin. I have asked her to check blood pressure and heart rate at home for the next few weeks. We may need to up titrate beta blocker, she had tolerated a higher dose previously.

## 2012-11-09 NOTE — Progress Notes (Signed)
Clinical Summary [name] is a 77 y.o.female last seen in May of this year. Record review finds hospitalization in June with pneumonia on baseline chronic COPD, also protein calorie malnutrition.  She states she is doing fairly well now. Breathing status stable, indicates that her Lopressor dose was cut back during hospitalization, has not noticed much of a difference. She has not been checking blood pressure and heart rate at home. No sense of palpitations.  Echocardiogram from June showed mild LVH with LV EF 60-65%, trivial mitral regurgitation, device wire in the right heart, normal CVP, mild tricuspid regurgitation with normal PASP.  Allergies  Allergen Reactions  . Cardizem [Diltiazem Hcl] Itching and Rash  . Sulfonamide Derivatives Rash    Current Outpatient Prescriptions  Medication Sig Dispense Refill  . ALPRAZolam (XANAX) 0.25 MG tablet Take 0.25 mg by mouth at bedtime. For anxiety      . digoxin (LANOXIN) 0.125 MG tablet Take 1 tablet (125 mcg total) by mouth daily.  90 tablet  3  . fluticasone-salmeterol (ADVAIR HFA) 115-21 MCG/ACT inhaler Inhale 2 puffs into the lungs 2 (two) times daily.        . furosemide (LASIX) 40 MG tablet Take 40 mg by mouth daily as needed. Fluid retention      . metaxalone (SKELAXIN) 800 MG tablet Take 400 mg by mouth daily as needed for pain.      Marland Kitchen metoCLOPramide (REGLAN) 5 MG tablet Take 1 tablet (5 mg total) by mouth 2 (two) times daily before a meal.  60 tablet  0  . metoprolol tartrate (LOPRESSOR) 12.5 mg TABS Take 0.5 tablets (12.5 mg total) by mouth 2 (two) times daily.  30 tablet  0  . metroNIDAZOLE (FLAGYL) 500 MG tablet Take 1 tablet (500 mg total) by mouth every 8 (eight) hours.  15 tablet  0  . pantoprazole (PROTONIX) 40 MG tablet Take 1 tablet (40 mg total) by mouth 2 (two) times daily before a meal.  60 tablet  0  . raloxifene (EVISTA) 60 MG tablet Take 60 mg by mouth daily.        Marland Kitchen saccharomyces boulardii (FLORASTOR) 250 MG  capsule Take 1 capsule (250 mg total) by mouth 2 (two) times daily.  60 capsule  0  . tiotropium (SPIRIVA) 18 MCG inhalation capsule Place 18 mcg into inhaler and inhale daily.        . verapamil (CALAN-SR) 240 MG CR tablet Take 120-240 mg by mouth 2 (two) times daily. Take 240 mg (whole tablet) in the mornings and 120 mg (1/2 tablet) in the evenings      . warfarin (COUMADIN) 5 MG tablet Take 2.5-5 mg by mouth daily. As directed by coumadin clinic 5 mg on Mon-Wed-Fri 2.5 mg on Sun- Tues- Thurs-Sat       No current facility-administered medications for this visit.    Past Medical History  Diagnosis Date  . Osteoporosis   . Diverticulosis   . Renal cyst   . Hyperthyroidism   . C. difficile colitis     12/06  . Lumbar and sacral arthritis   . COPD (chronic obstructive pulmonary disease)     PFT 10/05/08 - FEV1 0.89(43%), FVC 1.84(62%), FEV1% 49, TLC 5.64(120%), DLCO 38%  . Hypoxemia     Nocturnal oxygen  . Atrial fibrillation     Paroxysmal  . Sick sinus syndrome     Medtronic PPM 2004  . Pulmonary nodule   . Pneumonia due to Haemophilus influenzae 03/2011  with positive blood cultures as well    Past Surgical History  Procedure Laterality Date  . Total hip arthroplasty      Right  . Insert / replace / remove pacemaker      Medtronic  . Abdominal hysterectomy    . Bronchoscopy with washings  03/2011    mucus in right lower lobe with obstruction and some bleeding.  chroinc bronchitis, lung collapse,  Brushings showed numerous acute inflammatory cells and degenerated materal .  . Esophagogastroduodenoscopy  05/23/2011    ZOX:WRUE distal esophageal erosions consistent with mild  reflux esophagitis/otherwise normal  . Colonoscopy  05/23/2011    AVW:UJWJXBJY colorectal polyps-treated/large rectal polyp likely corresponds to the area of abnormal PET activity    Social History [name] reports that she has quit smoking. Her smoking use included Cigarettes. She has a 20  pack-year smoking history. She has never used smokeless tobacco. [name] reports that she does not drink alcohol.  Review of Systems No fevers or chills, no cough. Otherwise negative.  Physical Examination Filed Vitals:   11/09/12 1510  BP: 109/61  Pulse: 90   Filed Weights   11/09/12 1510  Weight: 126 lb 6.4 oz (57.335 kg)    Chronically ill-appearing woman in no acute distress. Wearing oxygen. HEENT: Conjunctiva and lids normal, oropharynx with poor dentition.  Neck: Supple, elevated jugular venous pressure, no thyromegaly.  Cardiac: Irregular rate and rhythm, very distant, no S3 gallop.  Lungs: Diminished breath sounds throughout, nonlabored, no wheezing.  Abdomen: Soft, protuberant, no hepatomegaly.  Extremities: Venous stasis, varicosities, distal pulses one plus.    Problem List and Plan   Permanent atrial fibrillation Continue current rate control regimen plus Coumadin. I have asked her to check blood pressure and heart rate at home for the next few weeks. We may need to up titrate beta blocker, she had tolerated a higher dose previously.  SICK SINUS SYNDROME Status post pacemaker, followed by Dr. [name].    Jonelle Sidle, M.D., F.A.C.C.

## 2012-11-09 NOTE — Addendum Note (Signed)
Addended by: Derry Lory A on: 11/09/2012 04:39 PM   Modules accepted: Orders

## 2012-11-09 NOTE — Assessment & Plan Note (Signed)
Status post pacemaker, followed by Dr. [name].

## 2012-12-14 ENCOUNTER — Encounter: Payer: Self-pay | Admitting: Cardiology

## 2012-12-16 ENCOUNTER — Encounter: Payer: Self-pay | Admitting: Gastroenterology

## 2012-12-16 NOTE — Telephone Encounter (Signed)
EGD done July 2014 by Dr. Gabriel Cirri: normal. Retained food. I updated this in her PSH.

## 2013-02-09 ENCOUNTER — Ambulatory Visit: Payer: Medicare Other | Admitting: Cardiology

## 2013-02-09 ENCOUNTER — Ambulatory Visit (INDEPENDENT_AMBULATORY_CARE_PROVIDER_SITE_OTHER): Payer: Medicare Other | Admitting: Cardiology

## 2013-02-09 ENCOUNTER — Encounter: Payer: Self-pay | Admitting: Cardiology

## 2013-02-09 VITALS — BP 109/69 | HR 85 | Ht 65.0 in | Wt 131.8 lb

## 2013-02-09 DIAGNOSIS — J9610 Chronic respiratory failure, unspecified whether with hypoxia or hypercapnia: Secondary | ICD-10-CM

## 2013-02-09 DIAGNOSIS — I10 Essential (primary) hypertension: Secondary | ICD-10-CM

## 2013-02-09 DIAGNOSIS — R000 Tachycardia, unspecified: Secondary | ICD-10-CM

## 2013-02-09 DIAGNOSIS — I4891 Unspecified atrial fibrillation: Secondary | ICD-10-CM

## 2013-02-09 DIAGNOSIS — I4821 Permanent atrial fibrillation: Secondary | ICD-10-CM

## 2013-02-09 DIAGNOSIS — Z950 Presence of cardiac pacemaker: Secondary | ICD-10-CM

## 2013-02-09 MED ORDER — DIGOXIN 125 MCG PO TABS: 0.1250 mg | ORAL_TABLET | Freq: Every day | ORAL | Status: DC

## 2013-02-09 NOTE — Assessment & Plan Note (Signed)
Blood pressure control is good today. 

## 2013-02-09 NOTE — Assessment & Plan Note (Signed)
Maintain regular followup with Dr. [name].

## 2013-02-09 NOTE — Progress Notes (Signed)
Clinical Summary [name] is a 77 y.o.female last seen in August. She states that she has had more chest congestion and shortness of breath in the colder weather. Just now starting on an outpatient course of antibiotics per Dr. Sherril Croon. She also states she had a bronchoscopy with Dr. Orson Aloe since I last saw her. She continues to require oxygen.  Echocardiogram from June showed mild LVH with LVEF 60-65%, trivial mitral regurgitation, device wire in the right heart, normal CVP, mild tricuspid regurgitation with normal PASP.  Does report increased heart rate with activity. She has been out of her digoxin for the last month. Otherwise on stable doses of metoprolol and verapamil.  ECG today shows atrial fibrillation with controlled ventricular response.   Allergies  Allergen Reactions  . Cardizem [Diltiazem Hcl] Itching and Rash  . Sulfonamide Derivatives Rash    Current Outpatient Prescriptions  Medication Sig Dispense Refill  . ALPRAZolam (XANAX) 0.25 MG tablet Take 0.25 mg by mouth at bedtime. For anxiety      . fluticasone-salmeterol (ADVAIR HFA) 115-21 MCG/ACT inhaler Inhale 2 puffs into the lungs 2 (two) times daily.        . furosemide (LASIX) 40 MG tablet Take 40 mg by mouth daily as needed. Fluid retention      . metaxalone (SKELAXIN) 800 MG tablet Take 400 mg by mouth daily as needed for pain.      Marland Kitchen metoCLOPramide (REGLAN) 5 MG tablet Take 1 tablet (5 mg total) by mouth 2 (two) times daily before a meal.  60 tablet  0  . metoprolol tartrate (LOPRESSOR) 12.5 mg TABS Take 0.5 tablets (12.5 mg total) by mouth 2 (two) times daily.  30 tablet  0  . pantoprazole (PROTONIX) 40 MG tablet Take 1 tablet (40 mg total) by mouth 2 (two) times daily before a meal.  60 tablet  0  . raloxifene (EVISTA) 60 MG tablet Take 60 mg by mouth daily.        Marland Kitchen tiotropium (SPIRIVA) 18 MCG inhalation capsule Place 18 mcg into inhaler and inhale daily.        . verapamil (CALAN-SR) 240 MG CR tablet Take  120-240 mg by mouth 2 (two) times daily. Take 240 mg (whole tablet) in the mornings and 120 mg (1/2 tablet) in the evenings      . warfarin (COUMADIN) 5 MG tablet Take 2.5-5 mg by mouth daily. As directed by coumadin clinic 5 mg on Mon-Wed-Fri 2.5 mg on Sun- Tues- Thurs-Sat      . digoxin (LANOXIN) 0.125 MG tablet Take 1 tablet (0.125 mg total) by mouth daily.  90 tablet  1   No current facility-administered medications for this visit.    Past Medical History  Diagnosis Date  . Osteoporosis   . Diverticulosis   . Renal cyst   . Hyperthyroidism   . C. difficile colitis     12/06  . Lumbar and sacral arthritis   . COPD (chronic obstructive pulmonary disease)     PFT 10/05/08 - FEV1 0.89(43%), FVC 1.84(62%), FEV1% 49, TLC 5.64(120%), DLCO 38%  . Hypoxemia     Nocturnal oxygen  . Atrial fibrillation     Paroxysmal  . Sick sinus syndrome     Medtronic PPM 2004  . Pulmonary nodule   . Pneumonia due to Haemophilus influenzae 03/2011    with positive blood cultures as well    Past Surgical History  Procedure Laterality Date  . Total hip arthroplasty  Right  . Insert / replace / remove pacemaker      Medtronic  . Abdominal hysterectomy    . Bronchoscopy with washings  03/2011    mucus in right lower lobe with obstruction and some bleeding.  chroinc bronchitis, lung collapse,  Brushings showed numerous acute inflammatory cells and degenerated materal .  . Esophagogastroduodenoscopy  05/23/2011    ZOX:WRUE distal esophageal erosions consistent with mild  reflux esophagitis/otherwise normal  . Colonoscopy  05/23/2011    AVW:UJWJXBJY colorectal polyps-treated/large rectal polyp likely corresponds to the area of abnormal PET activity  . Esophagogastroduodenoscopy  July 2014    Dr. Gabriel Cirri: retained food in stomach    Social History [name] reports that she has quit smoking. Her smoking use included Cigarettes. She has a 20 pack-year smoking history. She has never used  smokeless tobacco. [name] reports that she does not drink alcohol.  Review of Systems Negative except as outlined.  Physical Examination Filed Vitals:   02/09/13 1319  BP: 109/69  Pulse: 85   Filed Weights   02/09/13 1319  Weight: 131 lb 12 oz (59.761 kg)    Chronically ill-appearing woman in no acute distress. Wearing oxygen.  HEENT: Conjunctiva and lids normal, oropharynx with poor dentition.  Neck: Supple, elevated jugular venous pressure, no thyromegaly.  Cardiac: Irregular rate and rhythm, very distant, no S3 gallop.  Lungs: Diminished breath sounds throughout, nonlabored, no wheezing.  Abdomen: Soft, protuberant, no hepatomegaly.  Extremities: Venous stasis, varicosities, distal pulses one plus.    Problem List and Plan   Permanent atrial fibrillation Refill provided for digoxin, otherwise continue current doses of metoprolol and verapamil. No bleeding problems on Coumadin. Followup arranged in 3 months.  CARDIAC PACEMAKER-Medtronic Maintain regular followup with Dr. [name].  Chronic respiratory failure Severe, oxygen requiring COPD.  Essential hypertension, benign Blood pressure control is good today.    Jonelle Sidle, M.D., F.A.C.C.

## 2013-02-09 NOTE — Assessment & Plan Note (Signed)
Refill provided for digoxin, otherwise continue current doses of metoprolol and verapamil. No bleeding problems on Coumadin. Followup arranged in 3 months.

## 2013-02-09 NOTE — Assessment & Plan Note (Signed)
Severe, oxygen requiring COPD.

## 2013-02-09 NOTE — Patient Instructions (Addendum)
Your physician recommends that you schedule a follow-up appointment in 3 MONTHS. 

## 2013-02-19 ENCOUNTER — Encounter (HOSPITAL_COMMUNITY): Payer: Self-pay | Admitting: Emergency Medicine

## 2013-02-19 ENCOUNTER — Emergency Department (HOSPITAL_COMMUNITY): Payer: Medicare Other

## 2013-02-19 ENCOUNTER — Emergency Department (HOSPITAL_COMMUNITY)
Admission: EM | Admit: 2013-02-19 | Discharge: 2013-02-19 | Disposition: A | Discharge status: Home / Self Care | Payer: Medicare Other | Attending: Emergency Medicine | Admitting: Emergency Medicine

## 2013-02-19 DIAGNOSIS — Z8719 Personal history of other diseases of the digestive system: Secondary | ICD-10-CM | POA: Insufficient documentation

## 2013-02-19 DIAGNOSIS — Z79899 Other long term (current) drug therapy: Secondary | ICD-10-CM | POA: Insufficient documentation

## 2013-02-19 DIAGNOSIS — Z8739 Personal history of other diseases of the musculoskeletal system and connective tissue: Secondary | ICD-10-CM | POA: Insufficient documentation

## 2013-02-19 DIAGNOSIS — R55 Syncope and collapse: Secondary | ICD-10-CM | POA: Insufficient documentation

## 2013-02-19 DIAGNOSIS — Z7901 Long term (current) use of anticoagulants: Secondary | ICD-10-CM | POA: Insufficient documentation

## 2013-02-19 DIAGNOSIS — Z8619 Personal history of other infectious and parasitic diseases: Secondary | ICD-10-CM | POA: Insufficient documentation

## 2013-02-19 DIAGNOSIS — J4489 Other specified chronic obstructive pulmonary disease: Secondary | ICD-10-CM | POA: Insufficient documentation

## 2013-02-19 DIAGNOSIS — J449 Chronic obstructive pulmonary disease, unspecified: Secondary | ICD-10-CM | POA: Insufficient documentation

## 2013-02-19 DIAGNOSIS — I509 Heart failure, unspecified: Secondary | ICD-10-CM | POA: Insufficient documentation

## 2013-02-19 DIAGNOSIS — Z87891 Personal history of nicotine dependence: Secondary | ICD-10-CM | POA: Insufficient documentation

## 2013-02-19 DIAGNOSIS — R42 Dizziness and giddiness: Secondary | ICD-10-CM | POA: Insufficient documentation

## 2013-02-19 DIAGNOSIS — Z8701 Personal history of pneumonia (recurrent): Secondary | ICD-10-CM | POA: Insufficient documentation

## 2013-02-19 DIAGNOSIS — Z8639 Personal history of other endocrine, nutritional and metabolic disease: Secondary | ICD-10-CM | POA: Insufficient documentation

## 2013-02-19 DIAGNOSIS — I4891 Unspecified atrial fibrillation: Secondary | ICD-10-CM | POA: Insufficient documentation

## 2013-02-19 DIAGNOSIS — Z9889 Other specified postprocedural states: Secondary | ICD-10-CM | POA: Insufficient documentation

## 2013-02-19 DIAGNOSIS — Z862 Personal history of diseases of the blood and blood-forming organs and certain disorders involving the immune mechanism: Secondary | ICD-10-CM | POA: Insufficient documentation

## 2013-02-19 LAB — DIGOXIN LEVEL: Digoxin Level: <0.3 ng/mL — ABNORMAL LOW (ref 0.8–2.0)

## 2013-02-19 LAB — BASIC METABOLIC PANEL
BUN: 12 mg/dL (ref 6–23)
CO2: 38 mEq/L — ABNORMAL HIGH (ref 19–32)
Chloride: 97 mEq/L (ref 96–112)
Creatinine, Ser: 0.8 mg/dL (ref 0.50–1.10)

## 2013-02-19 LAB — CBC WITH DIFFERENTIAL/PLATELET
HCT: 38.6 % (ref 36.0–46.0)
Hemoglobin: 11.7 g/dL — ABNORMAL LOW (ref 12.0–15.0)
Lymphocytes Relative: 13 % (ref 12–46)
Monocytes Absolute: 0.8 10*3/uL (ref 0.1–1.0)
Monocytes Relative: 7 % (ref 3–12)
Neutro Abs: 9.8 10*3/uL — ABNORMAL HIGH (ref 1.7–7.7)
RBC: 4.41 MIL/uL (ref 3.87–5.11)
WBC: 12.3 10*3/uL — ABNORMAL HIGH (ref 4.0–10.5)

## 2013-02-19 LAB — URINALYSIS, ROUTINE W REFLEX MICROSCOPIC
Bilirubin Urine: NEGATIVE
Hgb urine dipstick: NEGATIVE
Nitrite: NEGATIVE
Specific Gravity, Urine: 1.02 (ref 1.005–1.030)
pH: 6.5 (ref 5.0–8.0)

## 2013-02-19 LAB — GLUCOSE, CAPILLARY: Glucose-Capillary: 143 mg/dL — ABNORMAL HIGH (ref 70–99)

## 2013-02-19 LAB — TROPONIN I: Troponin I: <0.3 ng/mL (ref <0.30)

## 2013-02-19 LAB — PROTIME-INR
INR: 2.68 — ABNORMAL HIGH (ref 0.00–1.49)
Prothrombin Time: 27.6 seconds — ABNORMAL HIGH (ref 11.6–15.2)

## 2013-02-19 MED ORDER — SODIUM CHLORIDE 0.9 % IV BOLUS (SEPSIS)
500.0000 mL | Freq: Once | INTRAVENOUS | Status: AC
  Administered 2013-02-19: 500 mL via INTRAVENOUS

## 2013-02-19 MED ORDER — DIGOXIN 125 MCG PO TABS: 0.1250 mg | ORAL_TABLET | Freq: Every day | ORAL | Status: DC

## 2013-02-19 NOTE — ED Notes (Signed)
Was walking through the house and got light headed.  States she felt like she was going to pass out.  Her son was next to her and caught her.  Initial b/p 90/50.  Was pale when ems arrived. Has also been coughing with ems.

## 2013-02-19 NOTE — ED Provider Notes (Signed)
CSN: [id number]     Arrival date & time 02/19/13  1546 History  This chart was scribed for [employer]. Rubin Payor, MD by Joaquin Music, ED Scribe. This patient was seen in room APA14/AP[facility]14 and the patient's care was started at 4:08 PM.   Chief Complaint  Patient presents with  . Near Syncope   The history is provided by the patient. No language interpreter was used.   HPI Comments: [name] is a 77 y.o. female with hx of COPD and A-Fib who presents to the Emergency Department complaining of being near syncope with associated cough about an hour ago. Pt states she was lying down and walked to the door. She states she began feeling light-headed and "wanted to pass out". She states she is unsure of what took place after that. Pt states her family called EMS and was brought to the ED. Pt states she was previously admitted for having low BP. Pt states she has been eating and drinking normal prior to her syncope episode. Pt states she is on 2.5L of O2 at home. She reports seeing Dr. Orson Aloe, her Pulmonary PCP, for her increased coughing. She states he preformed a Bronchostomy last month. Pt denies dysuria, melena, and CP.  Past Medical History  Diagnosis Date  . Osteoporosis   . Diverticulosis   . Renal cyst   . Hyperthyroidism   . C. difficile colitis     12/06  . Lumbar and sacral arthritis   . COPD (chronic obstructive pulmonary disease)     PFT 10/05/08 - FEV1 0.89(43%), FVC 1.84(62%), FEV1% 49, TLC 5.64(120%), DLCO 38%  . Hypoxemia     Nocturnal oxygen  . Atrial fibrillation     Paroxysmal  . Sick sinus syndrome     Medtronic PPM 2004  . Pulmonary nodule   . Pneumonia due to Haemophilus influenzae 03/2011    with positive blood cultures as well  . CHF (congestive heart failure)    Past Surgical History  Procedure Laterality Date  . Total hip arthroplasty      Right  . Insert / replace / remove pacemaker      Medtronic  . Abdominal hysterectomy    .  Bronchoscopy with washings  03/2011    mucus in right lower lobe with obstruction and some bleeding.  chroinc bronchitis, lung collapse,  Brushings showed numerous acute inflammatory cells and degenerated materal .  . Esophagogastroduodenoscopy  05/23/2011    HYQ:MVHQ distal esophageal erosions consistent with mild  reflux esophagitis/otherwise normal  . Colonoscopy  05/23/2011    ION:[id number] colorectal polyps-treated/large rectal polyp likely corresponds to the area of abnormal PET activity  . Esophagogastroduodenoscopy  July 2014    Dr. Gabriel Cirri: retained food in stomach   Family History  Problem Relation Age of Onset  . Lung cancer Mother     ???patient states she had cancer but not sure what type  . Heart disease Brother   . Colon cancer Neg Hx    History  Substance Use Topics  . Smoking status: Former Smoker -- 1.00 packs/day for 20 years    Types: Cigarettes  . Smokeless tobacco: Never Used  . Alcohol Use: No   OB History   Grav Para Term Preterm Abortions TAB SAB Ect Mult Living                 Review of Systems  Constitutional: Negative for fever.  Respiratory: Positive for cough. Negative for shortness of breath and wheezing.  Cardiovascular: Negative for chest pain.  Gastrointestinal: Negative for nausea, vomiting, diarrhea and blood in stool.  Genitourinary: Negative for dysuria.  Neurological: Positive for syncope.   Allergies  Tape; Cardizem; and Sulfonamide derivatives  Home Medications   Current Outpatient Rx  Name  Route  Sig  Dispense  Refill  . albuterol (PROAIR HFA) 108 (90 BASE) MCG/ACT inhaler   Inhalation   Inhale 1 puff into the lungs every 6 (six) hours as needed for wheezing or shortness of breath.         . ALPRAZolam (XANAX) 0.25 MG tablet   Oral   Take 0.25 mg by mouth at bedtime. For anxiety         . fluticasone-salmeterol (ADVAIR HFA) 115-21 MCG/ACT inhaler   Inhalation   Inhale 2 puffs into the lungs 2 (two) times daily.            . furosemide (LASIX) 40 MG tablet   Oral   Take 40 mg by mouth daily as needed. Fluid retention         . HYDROcodone-acetaminophen (NORCO/VICODIN) 5-325 MG per tablet   Oral   Take 1 tablet by mouth daily as needed for moderate pain.         Marland Kitchen ipratropium-albuterol (DUONEB) 0.5-2.5 (3) MG/3ML SOLN   Nebulization   Take 3 mLs by nebulization daily as needed (for shortness of breath/wheezing).         . metoCLOPramide (REGLAN) 5 MG tablet   Oral   Take 5 mg by mouth 2 (two) times daily.         . metoprolol tartrate (LOPRESSOR) 25 MG tablet   Oral   Take 12.5 mg by mouth 2 (two) times daily.         . pantoprazole (PROTONIX) 40 MG tablet   Oral   Take 40 mg by mouth every morning.         . raloxifene (EVISTA) 60 MG tablet   Oral   Take 60 mg by mouth every morning.          . tiotropium (SPIRIVA) 18 MCG inhalation capsule   Inhalation   Place 18 mcg into inhaler and inhale at bedtime.          . verapamil (CALAN-SR) 240 MG CR tablet   Oral   Take 120-240 mg by mouth 2 (two) times daily. Take 240 mg (whole tablet) in the mornings and 120 mg (1/2 tablet) in the evenings         . warfarin (COUMADIN) 5 MG tablet   Oral   Take 2.5-5 mg by mouth daily. As directed by coumadin clinic 5 mg on Mon-Wed-Fri-Sat 2.5 mg on all other days         . digoxin (LANOXIN) 0.125 MG tablet   Oral   Take 1 tablet (0.125 mg total) by mouth daily.   90 tablet   1    Triage Vitals:BP 109/60  Pulse 68  Temp(Src) 97.7 F (36.5 C) (Oral)  Resp 20  SpO2 95%  Physical Exam  HENT:  No JVD. No edema   Cardiovascular:  Pacemaker in L wall  Pulmonary/Chest:  Defuse wheezes and prolonged expirations  Skin:  Pt does not appear to be pale.   ED Course  Procedures COORDINATION OF CARE: 4:16 PM-Discussed treatment plan which includes EKG and labs. Pt agreed to plan.   Labs Review Labs Reviewed  GLUCOSE, CAPILLARY - Abnormal; Notable for the following:     Glucose-Capillary 143 (*)  All other components within normal limits  CBC WITH DIFFERENTIAL - Abnormal; Notable for the following:    WBC 12.3 (*)    Hemoglobin 11.7 (*)    RDW 17.1 (*)    Neutrophils Relative % 80 (*)    Neutro Abs 9.8 (*)    All other components within normal limits  BASIC METABOLIC PANEL - Abnormal; Notable for the following:    Potassium 3.4 (*)    CO2 38 (*)    Glucose, Bld 121 (*)    GFR calc non Af Amer 70 (*)    GFR calc Af Amer 82 (*)    All other components within normal limits  DIGOXIN LEVEL - Abnormal; Notable for the following:    Digoxin Level <0.3 (*)    All other components within normal limits  PROTIME-INR - Abnormal; Notable for the following:    Prothrombin Time 27.6 (*)    INR 2.68 (*)    All other components within normal limits  TROPONIN I  URINALYSIS, ROUTINE W REFLEX MICROSCOPIC   Imaging Review Dg Chest 2 View  02/19/2013   CLINICAL DATA:  Short of breath  EXAM: CHEST  2 VIEW  COMPARISON:  09/18/2012  FINDINGS: Emphysema and scarring throughout both lungs are stable. Dual lead left subclavian pacemaker device and leads are stable and intact. No pneumothorax. No pleural effusion. Small Bochdalek's hernia in the posterior inferior right hemi thorax is stable compare with a prior CT. Osteopenia without compression deformity. Mild cardiomegaly.  IMPRESSION: Emphysema.  No active cardiopulmonary disease.   Electronically Signed   By: Maryclare Bean M.D.   On: 02/19/2013 17:56   Ct Head Wo Contrast  02/19/2013   CLINICAL DATA:  Near syncope  EXAM: CT HEAD WITHOUT CONTRAST  TECHNIQUE: Contiguous axial images were obtained from the base of the skull through the vertex without intravenous contrast.  COMPARISON:  09/08/2012  FINDINGS: Chronic ischemic changes in the periventricular white matter. No mass effect, midline shift, or acute intracranial hemorrhage. Cranium is intact. Mastoid air cells and visualized paranasal sinuses are clear.  IMPRESSION: No  acute intracranial pathology.   Electronically Signed   By: Maryclare Bean M.D.   On: 02/19/2013 17:52    EKG Interpretation    Date/Time:  Saturday February 19 2013 16:01:07 EST Ventricular Rate:  81 PR Interval:    QRS Duration: 88 QT Interval:  422 QTC Calculation: 490 R Axis:   92 Text Interpretation:  Atrial fibrillation with occasional ventricular-paced complexes Rightward axis Nonspecific T wave abnormality Abnormal ECG When compared with ECG of 08-Sep-2012 15:26, Electronic ventricular pacemaker has replaced Atrial fibrillation Confirmed by Diania Co  MD, Areon Cocuzza ([id number]) on 02/19/2013 4:22:54 PM            MDM   1. Near syncope    Patient with episode of syncope or near syncope. Began after standing quickly. She has a pacemaker. Was interrogated and did not show recent for syncope. Blood pressures improved. She feels better on the discharged home. Doubt severe cardiac cause of the syncope.  I personally performed the services described in this documentation, which was scribed in my presence. The recorded information has been reviewed and is accurate.      Juliet Rude. Rubin Payor, MD 02/20/13 1610

## 2013-02-19 NOTE — ED Notes (Signed)
Pt states when she was over in ct,  She got sob and felt funny.  Now that she is back over here in her room.  She states she feels fine.

## 2013-02-19 NOTE — ED Notes (Signed)
Pt ambulated without assistance at this time pt stated that she felt fine. [name]

## 2013-02-25 ENCOUNTER — Ambulatory Visit (INDEPENDENT_AMBULATORY_CARE_PROVIDER_SITE_OTHER): Payer: Medicare Other | Admitting: Internal Medicine

## 2013-02-25 ENCOUNTER — Encounter: Payer: Self-pay | Admitting: Internal Medicine

## 2013-02-25 ENCOUNTER — Other Ambulatory Visit: Payer: Self-pay | Admitting: *Deleted

## 2013-02-25 VITALS — BP 120/55 | HR 104 | Ht 65.0 in | Wt 132.0 lb

## 2013-02-25 DIAGNOSIS — I4891 Unspecified atrial fibrillation: Secondary | ICD-10-CM

## 2013-02-25 DIAGNOSIS — I495 Sick sinus syndrome: Secondary | ICD-10-CM

## 2013-02-25 DIAGNOSIS — Z950 Presence of cardiac pacemaker: Secondary | ICD-10-CM

## 2013-02-25 DIAGNOSIS — I4821 Permanent atrial fibrillation: Secondary | ICD-10-CM

## 2013-02-25 DIAGNOSIS — I10 Essential (primary) hypertension: Secondary | ICD-10-CM

## 2013-02-25 LAB — MDC_IDC_ENUM_SESS_TYPE_INCLINIC
Battery Impedance: 134 Ohm
Brady Statistic AP VP Percent: 0 %
Brady Statistic AP VS Percent: 0 %
Brady Statistic AS VP Percent: 3 %
Brady Statistic AS VS Percent: 97 %
Lead Channel Impedance Value: 475 Ohm
Lead Channel Impedance Value: 581 Ohm
Lead Channel Pacing Threshold Pulse Width: 0.4 ms
Lead Channel Sensing Intrinsic Amplitude: 0.5 mV
Lead Channel Setting Pacing Amplitude: 2 V
Lead Channel Setting Pacing Pulse Width: 0.4 ms

## 2013-02-25 MED ORDER — DIGOXIN 125 MCG PO TABS: 0.1250 mg | ORAL_TABLET | Freq: Every day | ORAL | Status: DC

## 2013-02-25 NOTE — Patient Instructions (Addendum)
Your physician wants you to follow-up in:  3 months You will receive a reminder letter in the mail two months in advance. If you don't receive a letter, please call our office to schedule the follow-up appointment.  CARELINK SCHEDULED 05-30-2013

## 2013-02-27 ENCOUNTER — Encounter: Payer: Self-pay | Admitting: Internal Medicine

## 2013-02-27 NOTE — Assessment & Plan Note (Signed)
Her blood pressure is well controlled. Continue current meds. She will maintain a low sodium diet.

## 2013-02-27 NOTE — Assessment & Plan Note (Signed)
Her Medtronic DDD PM is working normally. Will recheck in several months. 

## 2013-02-27 NOTE — Assessment & Plan Note (Signed)
Her ventricular rate appears to be well controlled. Continue current meds.

## 2013-02-27 NOTE — Progress Notes (Signed)
HPI [name] returns today for followup. She is a pleasant 77 yo woman with a h/o COPD, chronic atrial fibrillation, symptomatic bradycardia, s/p PPM insertion. She has class 3 dyspnea and is on home oxygen. She has not had syncope. No anginal symptoms. She has struggled keeping her weight up. She has not fallen. She has a chronic cough. Allergies  Allergen Reactions  . Tape Other (See Comments)    Paper tape only per family of the patient due to thinning of skin  . Cardizem [Diltiazem Hcl] Itching and Rash  . Sulfonamide Derivatives Rash     Current Outpatient Prescriptions  Medication Sig Dispense Refill  . albuterol (PROAIR HFA) 108 (90 BASE) MCG/ACT inhaler Inhale 1 puff into the lungs every 6 (six) hours as needed for wheezing or shortness of breath.      . ALPRAZolam (XANAX) 0.25 MG tablet Take 0.25 mg by mouth at bedtime. For anxiety      . fluticasone-salmeterol (ADVAIR HFA) 115-21 MCG/ACT inhaler Inhale 2 puffs into the lungs 2 (two) times daily.        . furosemide (LASIX) 40 MG tablet Take 40 mg by mouth daily as needed. Fluid retention      . HYDROcodone-acetaminophen (NORCO/VICODIN) 5-325 MG per tablet Take 1 tablet by mouth daily as needed for moderate pain.      Marland Kitchen ipratropium-albuterol (DUONEB) 0.5-2.5 (3) MG/3ML SOLN Take 3 mLs by nebulization daily as needed (for shortness of breath/wheezing).      . metoCLOPramide (REGLAN) 5 MG tablet Take 5 mg by mouth 2 (two) times daily.      . pantoprazole (PROTONIX) 40 MG tablet Take 40 mg by mouth every morning.      . raloxifene (EVISTA) 60 MG tablet Take 60 mg by mouth every morning.       . tiotropium (SPIRIVA) 18 MCG inhalation capsule Place 18 mcg into inhaler and inhale at bedtime.       . verapamil (CALAN-SR) 240 MG CR tablet Take 120-240 mg by mouth 2 (two) times daily. Take 240 mg (whole tablet) in the mornings and 120 mg (1/2 tablet) in the evenings      . warfarin (COUMADIN) 5 MG tablet Take 2.5-5 mg by mouth  daily. As directed by coumadin clinic 5 mg on Mon-Wed-Fri-Sat 2.5 mg on all other days      . digoxin (LANOXIN) 0.125 MG tablet Take 1 tablet (0.125 mg total) by mouth daily.  90 tablet  1  . metoprolol tartrate (LOPRESSOR) 25 MG tablet Take 12.5 mg by mouth 2 (two) times daily.       No current facility-administered medications for this visit.     Past Medical History  Diagnosis Date  . Osteoporosis   . Diverticulosis   . Renal cyst   . Hyperthyroidism   . C. difficile colitis     12/06  . Lumbar and sacral arthritis   . COPD (chronic obstructive pulmonary disease)     PFT 10/05/08 - FEV1 0.89(43%), FVC 1.84(62%), FEV1% 49, TLC 5.64(120%), DLCO 38%  . Hypoxemia     Nocturnal oxygen  . Atrial fibrillation     Paroxysmal  . Sick sinus syndrome     Medtronic PPM 2004  . Pulmonary nodule   . Pneumonia due to Haemophilus influenzae 03/2011    with positive blood cultures as well  . CHF (congestive heart failure)     ROS:   All systems reviewed and negative except as noted  in the HPI.   Past Surgical History  Procedure Laterality Date  . Total hip arthroplasty      Right  . Insert / replace / remove pacemaker      Medtronic  . Abdominal hysterectomy    . Bronchoscopy with washings  03/2011    mucus in right lower lobe with obstruction and some bleeding.  chroinc bronchitis, lung collapse,  Brushings showed numerous acute inflammatory cells and degenerated materal .  . Esophagogastroduodenoscopy  05/23/2011    ZOX:WRUE distal esophageal erosions consistent with mild  reflux esophagitis/otherwise normal  . Colonoscopy  05/23/2011    AVW:[id number] colorectal polyps-treated/large rectal polyp likely corresponds to the area of abnormal PET activity  . Esophagogastroduodenoscopy  July 2014    Dr. Gabriel Cirri: retained food in stomach     Family History  Problem Relation Age of Onset  . Lung cancer Mother     ???patient states she had cancer but not sure what type  . Heart  disease Brother   . Colon cancer Neg Hx      History   Social History  . Marital Status: Widowed    Spouse Name: N/A    Number of Children: N/A  . Years of Education: N/A   Occupational History  . Retired     Therapist, occupational   Social History Main Topics  . Smoking status: Former Smoker -- 1.00 packs/day for 20 years    Types: Cigarettes  . Smokeless tobacco: Never Used  . Alcohol Use: No  . Drug Use: No  . Sexual Activity: Not on file   Other Topics Concern  . Not on file   Social History Narrative  . No narrative on file     BP 120/55  Pulse 104  Ht 5\' 5"  (1.651 m)  Wt 132 lb (59.875 kg)  BMI 21.97 kg/m2  Physical Exam:  frail appearing NAD HEENT: Unremarkable Neck:  No JVD, no thyromegally Back:  No CVA tenderness Lungs:  Clear except for scattered wheezes. Decreased breath sounds throughout. HEART:  Regular rate rhythm, no murmurs, no rubs, no clicks Abd:  soft, positive bowel sounds, no organomegally, no rebound, no guarding Ext:  2 plus pulses, no edema, no cyanosis, no clubbing Skin:  No rashes no nodules Neuro:  CN II through XII intact, motor grossly intact  DEVICE  Normal device function.  See PaceArt for details.   Assess/Plan:

## 2013-03-30 ENCOUNTER — Telehealth: Payer: Self-pay | Admitting: Cardiology

## 2013-03-30 MED ORDER — VERAPAMIL HCL ER 240 MG PO TBCR: 120.0000 mg | EXTENDED_RELEASE_TABLET | Freq: Two times a day (BID) | ORAL | Status: DC

## 2013-03-30 NOTE — Telephone Encounter (Signed)
Medication sent via escribe.  

## 2013-03-30 NOTE — Telephone Encounter (Signed)
Patient needs new RX for Verapamil sent to [facility] due to dosage change / tgs

## 2013-04-05 ENCOUNTER — Telehealth: Payer: Self-pay | Admitting: Internal Medicine

## 2013-04-05 NOTE — Telephone Encounter (Signed)
Pending verification from Dr. Cristopher Peru in office tomorrow

## 2013-04-05 NOTE — Telephone Encounter (Signed)
Please see paper in refill bin / tgs  °

## 2013-04-06 ENCOUNTER — Other Ambulatory Visit: Payer: Self-pay | Admitting: *Deleted

## 2013-04-06 MED ORDER — VERAPAMIL HCL ER 120 MG PO TBCR: 120.0000 mg | EXTENDED_RELEASE_TABLET | Freq: Two times a day (BID) | ORAL | Status: DC

## 2013-04-06 NOTE — Telephone Encounter (Signed)
Addressed by HD LPN today per Dr. Cristopher Peru addressed verbally

## 2013-04-11 NOTE — Telephone Encounter (Signed)
FYI:Pt called back to confirm what she heard from the nurse HD the other morning when she called per noted she was asleep when she called and she is unable to remember, pt advised per incoming fax from [facility] to confirm pt sig for Verapamil per pt was taking one and a half tablets and pharmacy advised this medication did not need to be prescribed this way, Dr. Cristopher Peru advised verbally to HD LPN to have the pt take Verapamil 120mg  twice daily, this RX was already sent in via escribe per HD on 04-08-13, pt advised that her PCP stopped her from taking her metoprolol after she had her seizure November 29th  and she just wanted to inform her cardiologist, this nurse advised we will inform the MD, pt understood instructions

## 2013-05-16 ENCOUNTER — Encounter: Payer: Self-pay | Admitting: Cardiology

## 2013-05-16 ENCOUNTER — Encounter: Payer: Medicare Other | Admitting: Cardiology

## 2013-05-16 NOTE — Progress Notes (Signed)
Patient canceled.  This encounter was created in error - please disregard. 

## 2013-05-20 ENCOUNTER — Ambulatory Visit: Payer: Medicare Other | Admitting: Cardiology

## 2013-05-30 ENCOUNTER — Encounter: Payer: Medicare Other | Admitting: *Deleted

## 2013-05-30 ENCOUNTER — Encounter: Payer: Medicare Other | Admitting: Internal Medicine

## 2013-06-09 ENCOUNTER — Encounter: Payer: Self-pay | Admitting: *Deleted

## 2013-06-13 ENCOUNTER — Encounter: Payer: Self-pay | Admitting: Cardiology

## 2013-06-13 ENCOUNTER — Ambulatory Visit (INDEPENDENT_AMBULATORY_CARE_PROVIDER_SITE_OTHER): Payer: Medicare Other | Admitting: Cardiology

## 2013-06-13 VITALS — BP 145/78 | HR 112 | Ht 65.0 in | Wt 131.0 lb

## 2013-06-13 DIAGNOSIS — I495 Sick sinus syndrome: Secondary | ICD-10-CM

## 2013-06-13 DIAGNOSIS — I4821 Permanent atrial fibrillation: Secondary | ICD-10-CM

## 2013-06-13 DIAGNOSIS — I4891 Unspecified atrial fibrillation: Secondary | ICD-10-CM

## 2013-06-13 NOTE — Assessment & Plan Note (Signed)
Continue current doses of Lopressor, digoxin, and make sure that Calan SR is 120 mg twice daily. Continue on Coumadin. Followup in 4 months.

## 2013-06-13 NOTE — Progress Notes (Signed)
Clinical Summary Ms. Vanorman is a 78 y.o.female last seen in November 2014. Visit with Dr. Lovena Le noted in December. She came into the office today several hours prior to her scheduled visit stating that she had a funeral this afternoon to attend, we worked her in to be seen. She reports continued problems with shortness of breath, on chronic oxygen with severe COPD. She has had recurrent episodes of chest congestion/URIs over the winter. Still not aware of any rapid sense of palpitations, but does find her heart rate to be elevated at times when she checks it.  We reviewed her medications. She has been taking Calan SR 120 mg one half tablet twice daily instead of the full tablet twice daily. We clarified this with her.  Echocardiogram from June showed mild LVH with LVEF 60-65%, trivial mitral regurgitation, device wire in the right heart, normal CVP, mild tricuspid regurgitation with normal PASP.   Allergies  Allergen Reactions  . Tape Other (See Comments)    Paper tape only per family of the patient due to thinning of skin  . Cardizem [Diltiazem Hcl] Itching and Rash  . Sulfonamide Derivatives Rash    Current Outpatient Prescriptions  Medication Sig Dispense Refill  . albuterol (PROAIR HFA) 108 (90 BASE) MCG/ACT inhaler Inhale 1 puff into the lungs every 6 (six) hours as needed for wheezing or shortness of breath.      . ALPRAZolam (XANAX) 0.25 MG tablet Take 0.25 mg by mouth at bedtime. For anxiety      . cephALEXin (KEFLEX) 500 MG capsule Take 500 mg by mouth 2 (two) times daily.      . digoxin (LANOXIN) 0.125 MG tablet Take 1 tablet (0.125 mg total) by mouth daily.  90 tablet  1  . fluticasone-salmeterol (ADVAIR HFA) 115-21 MCG/ACT inhaler Inhale 2 puffs into the lungs 2 (two) times daily.        . furosemide (LASIX) 40 MG tablet Take 40 mg by mouth daily as needed. Fluid retention      . HYDROcodone-acetaminophen (NORCO/VICODIN) 5-325 MG per tablet Take 1 tablet by mouth daily as  needed for moderate pain.      Marland Kitchen ipratropium-albuterol (DUONEB) 0.5-2.5 (3) MG/3ML SOLN Take 3 mLs by nebulization daily as needed (for shortness of breath/wheezing).      . metoCLOPramide (REGLAN) 5 MG tablet Take 5 mg by mouth 2 (two) times daily.      . metoprolol tartrate (LOPRESSOR) 25 MG tablet Take 12.5 mg by mouth 2 (two) times daily.      . pantoprazole (PROTONIX) 40 MG tablet Take 40 mg by mouth every morning.      . predniSONE (DELTASONE) 10 MG tablet Take 10 mg by mouth 2 (two) times daily with a meal.      . raloxifene (EVISTA) 60 MG tablet Take 60 mg by mouth every morning.       . tiotropium (SPIRIVA) 18 MCG inhalation capsule Place 18 mcg into inhaler and inhale at bedtime.       . verapamil (CALAN-SR) 120 MG CR tablet Take 1 tablet (120 mg total) by mouth 2 (two) times daily.  60 tablet  6  . warfarin (COUMADIN) 5 MG tablet Take 2.5-5 mg by mouth daily. As directed by coumadin clinic 5 mg on Mon-Wed-Fri-Sat 2.5 mg on all other days       No current facility-administered medications for this visit.    Past Medical History  Diagnosis Date  . Osteoporosis   .  Diverticulosis   . Renal cyst   . Hyperthyroidism   . C. difficile colitis     12/06  . Lumbar and sacral arthritis   . COPD (chronic obstructive pulmonary disease)     PFT 10/05/08 - FEV1 0.89(43%), FVC 1.84(62%), FEV1% 49, TLC 5.64(120%), DLCO 38%  . Hypoxemia     Nocturnal oxygen  . Atrial fibrillation   . Sick sinus syndrome     Medtronic PPM 2004  . Pulmonary nodule   . Pneumonia due to Haemophilus influenzae 03/2011    Positive blood cultures    Past Surgical History  Procedure Laterality Date  . Total hip arthroplasty      Right  . Insert / replace / remove pacemaker      Medtronic  . Abdominal hysterectomy    . Bronchoscopy with washings  03/2011    mucus in right lower lobe with obstruction and some bleeding.  chroinc bronchitis, lung collapse,  Brushings showed numerous acute inflammatory cells  and degenerated materal .  . Esophagogastroduodenoscopy  05/23/2011    ERD:EYCX distal esophageal erosions consistent with mild  reflux esophagitis/otherwise normal  . Colonoscopy  05/23/2011    KGY:JEHUDJSH colorectal polyps-treated/large rectal polyp likely corresponds to the area of abnormal PET activity  . Esophagogastroduodenoscopy  July 2014    Dr. Anthony Sar: retained food in stomach    Social History Ms. Vinal reports that she has quit smoking. Her smoking use included Cigarettes. She has a 20 pack-year smoking history. She has never used smokeless tobacco. Ms. [place] reports that she does not drink alcohol.  Review of Systems No bleeding problems. Otherwise negative except as outlined.  Physical Examination Filed Vitals:   06/13/13 1001  BP: 145/78  Pulse: 112   Filed Weights   06/13/13 1001  Weight: 131 lb (59.421 kg)    Chronically ill-appearing woman in no acute distress. Wearing oxygen.  HEENT: Conjunctiva and lids normal, oropharynx with poor dentition.  Neck: Supple, elevated jugular venous pressure, no thyromegaly.  Cardiac: Irregular rate and rhythm, very distant, no S3 gallop.  Lungs: Diminished breath sounds throughout, nonlabored, no wheezing.  Abdomen: Soft, protuberant, no hepatomegaly.  Extremities: Venous stasis, varicosities, distal pulses one plus.    Problem List and Plan   Permanent atrial fibrillation Continue current doses of Lopressor, digoxin, and make sure that Calan SR is 120 mg twice daily. Continue on Coumadin. Followup in 4 months.  SICK SINUS SYNDROME Medtronic pacemaker in place, keep followup with Dr. Lovena Le.    Satira Sark, M.D., F.A.C.C.

## 2013-06-13 NOTE — Assessment & Plan Note (Signed)
Medtronic pacemaker in place, keep followup with Dr. Lovena Le.

## 2013-06-13 NOTE — Patient Instructions (Signed)
Your physician wants you to follow-up in: 4 months. You will receive a reminder letter in the mail two months in advance. If you don't receive a letter, please call our office to schedule the follow-up appointment.  Please make sure you are taking Calan-SR 120 mg TWICE a DAY    Thank you for choosing [facility] !

## 2013-07-25 ENCOUNTER — Encounter: Payer: Medicare Other | Admitting: Internal Medicine

## 2013-08-22 ENCOUNTER — Encounter: Payer: Medicare Other | Admitting: Internal Medicine

## 2013-09-26 ENCOUNTER — Encounter: Payer: Medicare Other | Admitting: Internal Medicine

## 2013-10-09 ENCOUNTER — Inpatient Hospital Stay (HOSPITAL_COMMUNITY)
Admission: AD | Admit: 2013-10-09 | Discharge: 2013-10-19 | DRG: 329 | Disposition: A | Discharge status: Skilled Nursing Facility | Payer: Medicare Other | Source: Other Acute Inpatient Hospital | Attending: Internal Medicine | Admitting: Internal Medicine

## 2013-10-09 ENCOUNTER — Inpatient Hospital Stay (HOSPITAL_COMMUNITY): Payer: Medicare Other | Admitting: Certified Registered"

## 2013-10-09 ENCOUNTER — Encounter (HOSPITAL_COMMUNITY): Payer: Self-pay | Admitting: *Deleted

## 2013-10-09 ENCOUNTER — Encounter (HOSPITAL_COMMUNITY): Payer: Medicare Other | Admitting: Certified Registered"

## 2013-10-09 ENCOUNTER — Inpatient Hospital Stay (HOSPITAL_COMMUNITY): Payer: Medicare Other

## 2013-10-09 ENCOUNTER — Encounter (HOSPITAL_COMMUNITY)
Admission: AD | Disposition: A | Discharge status: Skilled Nursing Facility | Payer: Self-pay | Source: Other Acute Inpatient Hospital | Attending: Pulmonary Disease

## 2013-10-09 DIAGNOSIS — Z79899 Other long term (current) drug therapy: Secondary | ICD-10-CM | POA: Diagnosis not present

## 2013-10-09 DIAGNOSIS — J9620 Acute and chronic respiratory failure, unspecified whether with hypoxia or hypercapnia: Secondary | ICD-10-CM | POA: Diagnosis not present

## 2013-10-09 DIAGNOSIS — I495 Sick sinus syndrome: Secondary | ICD-10-CM | POA: Diagnosis present

## 2013-10-09 DIAGNOSIS — F411 Generalized anxiety disorder: Secondary | ICD-10-CM | POA: Diagnosis present

## 2013-10-09 DIAGNOSIS — I4821 Permanent atrial fibrillation: Secondary | ICD-10-CM

## 2013-10-09 DIAGNOSIS — J189 Pneumonia, unspecified organism: Secondary | ICD-10-CM

## 2013-10-09 DIAGNOSIS — Z7901 Long term (current) use of anticoagulants: Secondary | ICD-10-CM | POA: Diagnosis not present

## 2013-10-09 DIAGNOSIS — K5669 Other intestinal obstruction: Secondary | ICD-10-CM | POA: Diagnosis present

## 2013-10-09 DIAGNOSIS — J9600 Acute respiratory failure, unspecified whether with hypoxia or hypercapnia: Secondary | ICD-10-CM

## 2013-10-09 DIAGNOSIS — J9602 Acute respiratory failure with hypercapnia: Secondary | ICD-10-CM

## 2013-10-09 DIAGNOSIS — Z96649 Presence of unspecified artificial hip joint: Secondary | ICD-10-CM | POA: Diagnosis not present

## 2013-10-09 DIAGNOSIS — M810 Age-related osteoporosis without current pathological fracture: Secondary | ICD-10-CM | POA: Diagnosis present

## 2013-10-09 DIAGNOSIS — K56609 Unspecified intestinal obstruction, unspecified as to partial versus complete obstruction: Secondary | ICD-10-CM

## 2013-10-09 DIAGNOSIS — J449 Chronic obstructive pulmonary disease, unspecified: Secondary | ICD-10-CM

## 2013-10-09 DIAGNOSIS — K659 Peritonitis, unspecified: Secondary | ICD-10-CM | POA: Diagnosis present

## 2013-10-09 DIAGNOSIS — K3189 Other diseases of stomach and duodenum: Secondary | ICD-10-CM

## 2013-10-09 DIAGNOSIS — K559 Vascular disorder of intestine, unspecified: Secondary | ICD-10-CM | POA: Diagnosis present

## 2013-10-09 DIAGNOSIS — I10 Essential (primary) hypertension: Secondary | ICD-10-CM

## 2013-10-09 DIAGNOSIS — IMO0002 Reserved for concepts with insufficient information to code with codable children: Secondary | ICD-10-CM

## 2013-10-09 DIAGNOSIS — E871 Hypo-osmolality and hyponatremia: Secondary | ICD-10-CM | POA: Diagnosis present

## 2013-10-09 DIAGNOSIS — K5660 Unspecified intestinal obstruction: Secondary | ICD-10-CM

## 2013-10-09 DIAGNOSIS — I4891 Unspecified atrial fibrillation: Secondary | ICD-10-CM

## 2013-10-09 DIAGNOSIS — K219 Gastro-esophageal reflux disease without esophagitis: Secondary | ICD-10-CM | POA: Diagnosis present

## 2013-10-09 DIAGNOSIS — E46 Unspecified protein-calorie malnutrition: Secondary | ICD-10-CM | POA: Diagnosis present

## 2013-10-09 DIAGNOSIS — K629 Disease of anus and rectum, unspecified: Secondary | ICD-10-CM

## 2013-10-09 DIAGNOSIS — R948 Abnormal results of function studies of other organs and systems: Secondary | ICD-10-CM

## 2013-10-09 DIAGNOSIS — Z87891 Personal history of nicotine dependence: Secondary | ICD-10-CM | POA: Diagnosis not present

## 2013-10-09 DIAGNOSIS — Z950 Presence of cardiac pacemaker: Secondary | ICD-10-CM | POA: Diagnosis not present

## 2013-10-09 DIAGNOSIS — D72829 Elevated white blood cell count, unspecified: Secondary | ICD-10-CM | POA: Diagnosis present

## 2013-10-09 DIAGNOSIS — R109 Unspecified abdominal pain: Secondary | ICD-10-CM | POA: Diagnosis present

## 2013-10-09 DIAGNOSIS — R39198 Other difficulties with micturition: Secondary | ICD-10-CM

## 2013-10-09 DIAGNOSIS — J9612 Chronic respiratory failure with hypercapnia: Secondary | ICD-10-CM

## 2013-10-09 DIAGNOSIS — J4489 Other specified chronic obstructive pulmonary disease: Secondary | ICD-10-CM | POA: Diagnosis present

## 2013-10-09 DIAGNOSIS — E876 Hypokalemia: Secondary | ICD-10-CM

## 2013-10-09 DIAGNOSIS — E43 Unspecified severe protein-calorie malnutrition: Secondary | ICD-10-CM

## 2013-10-09 DIAGNOSIS — J9610 Chronic respiratory failure, unspecified whether with hypoxia or hypercapnia: Secondary | ICD-10-CM | POA: Diagnosis present

## 2013-10-09 DIAGNOSIS — K5730 Diverticulosis of large intestine without perforation or abscess without bleeding: Secondary | ICD-10-CM | POA: Diagnosis present

## 2013-10-09 DIAGNOSIS — K5900 Constipation, unspecified: Secondary | ICD-10-CM | POA: Diagnosis present

## 2013-10-09 DIAGNOSIS — K631 Perforation of intestine (nontraumatic): Secondary | ICD-10-CM

## 2013-10-09 DIAGNOSIS — E860 Dehydration: Secondary | ICD-10-CM

## 2013-10-09 HISTORY — PX: LAPAROTOMY: SHX154

## 2013-10-09 LAB — COMPREHENSIVE METABOLIC PANEL
ALT: 7 U/L (ref 0–35)
AST: 13 U/L (ref 0–37)
Albumin: 2.1 g/dL — ABNORMAL LOW (ref 3.5–5.2)
Alkaline Phosphatase: 107 U/L (ref 39–117)
Anion gap: 12 (ref 5–15)
BILIRUBIN TOTAL: 0.8 mg/dL (ref 0.3–1.2)
BUN: 10 mg/dL (ref 6–23)
CHLORIDE: 93 meq/L — AB (ref 96–112)
CO2: 29 meq/L (ref 19–32)
CREATININE: 0.45 mg/dL — AB (ref 0.50–1.10)
Calcium: 8 mg/dL — ABNORMAL LOW (ref 8.4–10.5)
Glucose, Bld: 63 mg/dL — ABNORMAL LOW (ref 70–99)
Potassium: 4.2 mEq/L (ref 3.7–5.3)
Sodium: 134 mEq/L — ABNORMAL LOW (ref 137–147)
Total Protein: 5.3 g/dL — ABNORMAL LOW (ref 6.0–8.3)

## 2013-10-09 LAB — PROTIME-INR
INR: 1.39 (ref 0.00–1.49)
PROTHROMBIN TIME: 17.1 s — AB (ref 11.6–15.2)

## 2013-10-09 LAB — CBC
HCT: 35.1 % — ABNORMAL LOW (ref 36.0–46.0)
Hemoglobin: 10.2 g/dL — ABNORMAL LOW (ref 12.0–15.0)
MCH: 26.2 pg (ref 26.0–34.0)
MCHC: 29.1 g/dL — ABNORMAL LOW (ref 30.0–36.0)
MCV: 90 fL (ref 78.0–100.0)
PLATELETS: 265 10*3/uL (ref 150–400)
RBC: 3.9 MIL/uL (ref 3.87–5.11)
RDW: 16.5 % — AB (ref 11.5–15.5)
WBC: 20.7 10*3/uL — AB (ref 4.0–10.5)

## 2013-10-09 LAB — SURGICAL PCR SCREEN
MRSA, PCR: NEGATIVE
Staphylococcus aureus: NEGATIVE

## 2013-10-09 LAB — DIGOXIN LEVEL: Digoxin Level: 1.2 ng/mL (ref 0.8–2.0)

## 2013-10-09 LAB — ABO/RH: ABO/RH(D): O POS

## 2013-10-09 LAB — LACTIC ACID, PLASMA: Lactic Acid, Venous: 0.7 mmol/L (ref 0.5–2.2)

## 2013-10-09 LAB — TYPE AND SCREEN
ABO/RH(D): O POS
Antibody Screen: NEGATIVE

## 2013-10-09 SURGERY — LAPAROTOMY, EXPLORATORY
Anesthesia: General | Site: Abdomen

## 2013-10-09 MED ORDER — ONDANSETRON HCL 4 MG PO TABS
4.0000 mg | ORAL_TABLET | Freq: Four times a day (QID) | ORAL | Status: DC | PRN
  Filled 2013-10-09: qty 1

## 2013-10-09 MED ORDER — PIPERACILLIN-TAZOBACTAM 3.375 G IVPB 30 MIN
3.3750 g | Freq: Once | INTRAVENOUS | Status: AC
  Administered 2013-10-10: 3.375 g via INTRAVENOUS
  Filled 2013-10-09: qty 50

## 2013-10-09 MED ORDER — MORPHINE SULFATE 2 MG/ML IJ SOLN
2.0000 mg | Freq: Once | INTRAMUSCULAR | Status: AC
  Administered 2013-10-09: 2 mg via INTRAVENOUS
  Filled 2013-10-09: qty 1

## 2013-10-09 MED ORDER — PROPOFOL 10 MG/ML IV BOLUS
INTRAVENOUS | Status: DC | PRN
  Administered 2013-10-09: 50 mg via INTRAVENOUS
  Administered 2013-10-09: 100 mg via INTRAVENOUS
  Administered 2013-10-09: 50 mg via INTRAVENOUS

## 2013-10-09 MED ORDER — ALBUMIN HUMAN 5 % IV SOLN
INTRAVENOUS | Status: DC | PRN
  Administered 2013-10-09: 21:00:00 via INTRAVENOUS

## 2013-10-09 MED ORDER — ONDANSETRON HCL 4 MG/2ML IJ SOLN
4.0000 mg | Freq: Four times a day (QID) | INTRAMUSCULAR | Status: DC | PRN
  Administered 2013-10-12: 4 mg via INTRAVENOUS
  Filled 2013-10-09 (×2): qty 2

## 2013-10-09 MED ORDER — FENTANYL CITRATE 0.05 MG/ML IJ SOLN
50.0000 ug | INTRAMUSCULAR | Status: DC | PRN
  Administered 2013-10-11 – 2013-10-12 (×8): 50 ug via INTRAVENOUS
  Filled 2013-10-09 (×9): qty 2

## 2013-10-09 MED ORDER — VECURONIUM BROMIDE 10 MG IV SOLR
INTRAVENOUS | Status: AC
  Filled 2013-10-09: qty 10

## 2013-10-09 MED ORDER — PROPOFOL 10 MG/ML IV EMUL
0.0000 ug/kg/min | INTRAVENOUS | Status: DC
  Administered 2013-10-09 – 2013-10-10 (×2): 10 ug/kg/min via INTRAVENOUS
  Administered 2013-10-10 (×2): 30 ug/kg/min via INTRAVENOUS
  Administered 2013-10-10: 15 mg via INTRAVENOUS
  Administered 2013-10-10 (×2): 30 ug/kg/min via INTRAVENOUS
  Administered 2013-10-11 – 2013-10-12 (×2): 10 ug/kg/min via INTRAVENOUS
  Filled 2013-10-09 (×7): qty 100

## 2013-10-09 MED ORDER — DEXTROSE 5 % IV SOLN
2.0000 g | INTRAVENOUS | Status: DC | PRN
  Administered 2013-10-09: 2 g via INTRAVENOUS

## 2013-10-09 MED ORDER — PHENYLEPHRINE HCL 10 MG/ML IJ SOLN
10.0000 mg | INTRAMUSCULAR | Status: DC | PRN
  Administered 2013-10-09 (×2): 15 ug/min via INTRAVENOUS

## 2013-10-09 MED ORDER — PIPERACILLIN-TAZOBACTAM 3.375 G IVPB
3.3750 g | Freq: Three times a day (TID) | INTRAVENOUS | Status: DC
  Administered 2013-10-10 – 2013-10-15 (×16): 3.375 g via INTRAVENOUS
  Filled 2013-10-09 (×19): qty 50

## 2013-10-09 MED ORDER — SUCCINYLCHOLINE CHLORIDE 20 MG/ML IJ SOLN
INTRAMUSCULAR | Status: AC
  Filled 2013-10-09: qty 1

## 2013-10-09 MED ORDER — MOMETASONE FURO-FORMOTEROL FUM 100-5 MCG/ACT IN AERO
2.0000 | INHALATION_SPRAY | Freq: Two times a day (BID) | RESPIRATORY_TRACT | Status: DC
  Administered 2013-10-12 – 2013-10-19 (×12): 2 via RESPIRATORY_TRACT
  Filled 2013-10-09 (×3): qty 8.8

## 2013-10-09 MED ORDER — PROPRANOLOL HCL 1 MG/ML IV SOLN
INTRAVENOUS | Status: DC | PRN
  Administered 2013-10-09: .25 mg via INTRAVENOUS

## 2013-10-09 MED ORDER — HEPARIN SODIUM (PORCINE) 5000 UNIT/ML IJ SOLN
5000.0000 [IU] | Freq: Three times a day (TID) | INTRAMUSCULAR | Status: DC
  Administered 2013-10-10 – 2013-10-19 (×27): 5000 [IU] via SUBCUTANEOUS
  Filled 2013-10-09 (×35): qty 1

## 2013-10-09 MED ORDER — FENTANYL CITRATE 0.05 MG/ML IJ SOLN
INTRAMUSCULAR | Status: DC | PRN
  Administered 2013-10-09: 50 ug via INTRAVENOUS
  Administered 2013-10-09: 100 ug via INTRAVENOUS
  Administered 2013-10-09 (×3): 50 ug via INTRAVENOUS

## 2013-10-09 MED ORDER — MORPHINE SULFATE 2 MG/ML IJ SOLN
1.0000 mg | INTRAMUSCULAR | Status: DC | PRN
  Administered 2013-10-09: 2 mg via INTRAVENOUS
  Filled 2013-10-09: qty 1

## 2013-10-09 MED ORDER — FENTANYL CITRATE 0.05 MG/ML IJ SOLN
INTRAMUSCULAR | Status: AC
  Filled 2013-10-09: qty 5

## 2013-10-09 MED ORDER — CALCIUM CHLORIDE 10 % IV SOLN
INTRAVENOUS | Status: DC | PRN
Start: 2013-10-09 — End: 2013-10-11
  Administered 2013-10-09 (×2): 100 mg via INTRAVENOUS

## 2013-10-09 MED ORDER — STERILE WATER FOR INJECTION IJ SOLN
INTRAMUSCULAR | Status: AC
  Filled 2013-10-09: qty 10

## 2013-10-09 MED ORDER — METOPROLOL TARTRATE 1 MG/ML IV SOLN: 2.5000 mg | Freq: Four times a day (QID) | INTRAVENOUS | Status: DC

## 2013-10-09 MED ORDER — VECURONIUM BROMIDE 10 MG IV SOLR
INTRAVENOUS | Status: DC | PRN
  Administered 2013-10-09 (×2): 3 mg via INTRAVENOUS
  Administered 2013-10-09: 4 mg via INTRAVENOUS

## 2013-10-09 MED ORDER — HEPARIN SODIUM (PORCINE) 5000 UNIT/ML IJ SOLN: 5000.0000 [IU] | Freq: Three times a day (TID) | INTRAMUSCULAR | Status: DC

## 2013-10-09 MED ORDER — ALBUTEROL SULFATE HFA 108 (90 BASE) MCG/ACT IN AERS
INHALATION_SPRAY | RESPIRATORY_TRACT | Status: AC
  Filled 2013-10-09: qty 6.7

## 2013-10-09 MED ORDER — DEXTROSE 5 % IV SOLN
2.0000 g | INTRAVENOUS | Status: AC
  Administered 2013-10-10: 2 g via INTRAVENOUS
  Filled 2013-10-09: qty 2

## 2013-10-09 MED ORDER — ARTIFICIAL TEARS OP OINT
TOPICAL_OINTMENT | OPHTHALMIC | Status: AC
  Filled 2013-10-09: qty 3.5

## 2013-10-09 MED ORDER — ALBUTEROL SULFATE (2.5 MG/3ML) 0.083% IN NEBU: 2.5000 mg | INHALATION_SOLUTION | RESPIRATORY_TRACT | Status: DC | PRN

## 2013-10-09 MED ORDER — METOPROLOL TARTRATE 1 MG/ML IV SOLN
2.5000 mg | Freq: Four times a day (QID) | INTRAVENOUS | Status: DC
  Administered 2013-10-09 – 2013-10-14 (×19): 2.5 mg via INTRAVENOUS
  Filled 2013-10-09 (×30): qty 5

## 2013-10-09 MED ORDER — LACTATED RINGERS IV SOLN
INTRAVENOUS | Status: DC | PRN
  Administered 2013-10-09: 21:00:00 via INTRAVENOUS

## 2013-10-09 MED ORDER — 0.9 % SODIUM CHLORIDE (POUR BTL) OPTIME
TOPICAL | Status: DC | PRN
  Administered 2013-10-09: 4000 mL

## 2013-10-09 MED ORDER — LIDOCAINE HCL 4 % MT SOLN
OROMUCOSAL | Status: DC | PRN
  Administered 2013-10-09: 4 mL via TOPICAL

## 2013-10-09 MED ORDER — KCL IN DEXTROSE-NACL 20-5-0.9 MEQ/L-%-% IV SOLN
INTRAVENOUS | Status: DC
  Administered 2013-10-09: 100 mL/h via INTRAVENOUS
  Administered 2013-10-10: 07:00:00 via INTRAVENOUS
  Filled 2013-10-09 (×4): qty 1000

## 2013-10-09 MED ORDER — LORAZEPAM 2 MG/ML IJ SOLN: 1.0000 mg | Freq: Four times a day (QID) | INTRAMUSCULAR | Status: DC | PRN

## 2013-10-09 MED ORDER — ALBUTEROL SULFATE HFA 108 (90 BASE) MCG/ACT IN AERS
INHALATION_SPRAY | RESPIRATORY_TRACT | Status: DC | PRN
  Administered 2013-10-09: 2 via RESPIRATORY_TRACT

## 2013-10-09 MED ORDER — ACETAMINOPHEN 325 MG PO TABS: 650.0000 mg | ORAL_TABLET | Freq: Four times a day (QID) | ORAL | Status: DC | PRN

## 2013-10-09 MED ORDER — GUAIFENESIN-DM 100-10 MG/5ML PO SYRP
5.0000 mL | ORAL_SOLUTION | ORAL | Status: DC | PRN
  Filled 2013-10-09: qty 5

## 2013-10-09 MED ORDER — IPRATROPIUM-ALBUTEROL 0.5-2.5 (3) MG/3ML IN SOLN: 3.0000 mL | Freq: Three times a day (TID) | RESPIRATORY_TRACT | Status: DC

## 2013-10-09 MED ORDER — ONDANSETRON HCL 4 MG/2ML IJ SOLN
INTRAMUSCULAR | Status: DC | PRN
  Administered 2013-10-09: 4 mg via INTRAVENOUS

## 2013-10-09 MED ORDER — PIPERACILLIN-TAZOBACTAM 3.375 G IVPB
3.3750 g | Freq: Three times a day (TID) | INTRAVENOUS | Status: DC
  Filled 2013-10-09 (×2): qty 50

## 2013-10-09 MED ORDER — PROPOFOL 10 MG/ML IV EMUL
INTRAVENOUS | Status: AC
  Administered 2013-10-10: 15 mg via INTRAVENOUS
  Filled 2013-10-09: qty 100

## 2013-10-09 MED ORDER — DIGOXIN 0.25 MG/ML IJ SOLN
0.1250 mg | Freq: Every day | INTRAMUSCULAR | Status: DC
  Administered 2013-10-10 – 2013-10-15 (×6): 0.125 mg via INTRAVENOUS
  Filled 2013-10-09 (×7): qty 0.5

## 2013-10-09 MED ORDER — LIDOCAINE HCL (CARDIAC) 20 MG/ML IV SOLN
INTRAVENOUS | Status: DC | PRN
  Administered 2013-10-09: 60 mg via INTRAVENOUS

## 2013-10-09 MED ORDER — DEXTROSE 5 % IV SOLN
INTRAVENOUS | Status: DC | PRN
  Administered 2013-10-09: 21:00:00 via INTRAVENOUS

## 2013-10-09 MED ORDER — ACETAMINOPHEN 650 MG RE SUPP: 650.0000 mg | Freq: Four times a day (QID) | RECTAL | Status: DC | PRN

## 2013-10-09 MED ORDER — ONDANSETRON HCL 4 MG/2ML IJ SOLN
INTRAMUSCULAR | Status: AC
  Filled 2013-10-09: qty 2

## 2013-10-09 MED ORDER — SUCCINYLCHOLINE CHLORIDE 20 MG/ML IJ SOLN
INTRAMUSCULAR | Status: DC | PRN
  Administered 2013-10-09: .25 mg via INTRAVENOUS
  Administered 2013-10-09: 100 mg via INTRAVENOUS

## 2013-10-09 MED ORDER — PANTOPRAZOLE SODIUM 40 MG IV SOLR
40.0000 mg | INTRAVENOUS | Status: DC
  Administered 2013-10-10 – 2013-10-14 (×5): 40 mg via INTRAVENOUS
  Filled 2013-10-09 (×9): qty 40

## 2013-10-09 MED ORDER — TIOTROPIUM BROMIDE MONOHYDRATE 18 MCG IN CAPS: 18.0000 ug | ORAL_CAPSULE | Freq: Every day | RESPIRATORY_TRACT | Status: DC

## 2013-10-09 MED ORDER — BIOTENE DRY MOUTH MT LIQD
15.0000 mL | Freq: Two times a day (BID) | OROMUCOSAL | Status: DC
  Administered 2013-10-10 – 2013-10-19 (×19): 15 mL via OROMUCOSAL

## 2013-10-09 MED ORDER — ARTIFICIAL TEARS OP OINT
TOPICAL_OINTMENT | OPHTHALMIC | Status: DC | PRN
  Administered 2013-10-09: 1 via OPHTHALMIC

## 2013-10-09 MED ORDER — DEXTROSE 5 % IV SOLN
2.0000 g | INTRAVENOUS | Status: DC
  Filled 2013-10-09: qty 2

## 2013-10-09 MED ORDER — LABETALOL HCL 5 MG/ML IV SOLN
INTRAVENOUS | Status: DC | PRN
  Administered 2013-10-09 (×2): 5 mg via INTRAVENOUS

## 2013-10-09 SURGICAL SUPPLY — 59 items
BRR ADH 5X3 SEPRAFILM 6 SHT (MISCELLANEOUS) ×2
CHLORAPREP W/TINT 26ML (MISCELLANEOUS) ×4 IMPLANT
COVER MAYO STAND STRL (DRAPES) ×3 IMPLANT
COVER SURGICAL LIGHT HANDLE (MISCELLANEOUS) ×4 IMPLANT
DRAPE LAPAROSCOPIC ABDOMINAL (DRAPES) ×4 IMPLANT
DRAPE PROXIMA HALF (DRAPES) IMPLANT
DRAPE UTILITY 15X26 W/TAPE STR (DRAPE) ×8 IMPLANT
DRAPE WARM FLUID 44X44 (DRAPE) ×4 IMPLANT
DRESSING ALLEVYN LIFE SACRUM (GAUZE/BANDAGES/DRESSINGS) ×3 IMPLANT
ELECT BLADE 6.5 EXT (BLADE) IMPLANT
ELECT CAUTERY BLADE 6.4 (BLADE) ×5 IMPLANT
ELECT REM PT RETURN 9FT ADLT (ELECTROSURGICAL) ×4
ELECTRODE REM PT RTRN 9FT ADLT (ELECTROSURGICAL) ×2 IMPLANT
GLOVE BIO SURGEON STRL SZ 6.5 (GLOVE) ×4 IMPLANT
GLOVE BIO SURGEON STRL SZ7.5 (GLOVE) ×3 IMPLANT
GLOVE BIO SURGEON STRL SZ8 (GLOVE) ×3 IMPLANT
GLOVE BIO SURGEONS STRL SZ 6.5 (GLOVE) ×2
GLOVE BIOGEL M STRL SZ7.5 (GLOVE) ×8 IMPLANT
GLOVE BIOGEL PI IND STRL 7.0 (GLOVE) ×2 IMPLANT
GLOVE BIOGEL PI IND STRL 8 (GLOVE) ×3 IMPLANT
GLOVE BIOGEL PI INDICATOR 7.0 (GLOVE) ×4
GLOVE BIOGEL PI INDICATOR 8 (GLOVE) ×4
HEMOSTAT SURGICEL 2X14 (HEMOSTASIS) ×3 IMPLANT
HOOD PEEL AWAY FACE SHEILD DIS (HOOD) ×3 IMPLANT
KIT BASIN OR (CUSTOM PROCEDURE TRAY) ×4 IMPLANT
KIT OSTOMY DRAINABLE 2.75 STR (WOUND CARE) ×3 IMPLANT
KIT ROOM TURNOVER OR (KITS) ×4 IMPLANT
LIGASURE IMPACT 36 18CM CVD LR (INSTRUMENTS) ×4 IMPLANT
NS IRRIG 1000ML POUR BTL (IV SOLUTION) ×8 IMPLANT
PACK GENERAL/GYN (CUSTOM PROCEDURE TRAY) ×4 IMPLANT
PAD ABD 8X10 STRL (GAUZE/BANDAGES/DRESSINGS) ×3 IMPLANT
PAD ARMBOARD 7.5X6 YLW CONV (MISCELLANEOUS) ×7 IMPLANT
PENCIL BUTTON HOLSTER BLD 10FT (ELECTRODE) IMPLANT
SEPRAFILM PROCEDURAL PACK 3X5 (MISCELLANEOUS) ×3 IMPLANT
SPECIMEN JAR LARGE (MISCELLANEOUS) ×4 IMPLANT
SPONGE GAUZE 4X4 12PLY (GAUZE/BANDAGES/DRESSINGS) ×4 IMPLANT
SPONGE GAUZE 4X4 12PLY STER LF (GAUZE/BANDAGES/DRESSINGS) ×3 IMPLANT
SPONGE LAP 18X18 X RAY DECT (DISPOSABLE) IMPLANT
STAPLER CUT CVD 40MM GREEN (STAPLE) ×3 IMPLANT
STAPLER PROXIMATE 75MM BLUE (STAPLE) ×3 IMPLANT
STAPLER VISISTAT 35W (STAPLE) ×4 IMPLANT
SUCTION POOLE TIP (SUCTIONS) ×4 IMPLANT
SUT NOVA 1 T20/GS 25DT (SUTURE) ×3 IMPLANT
SUT NOVAFIL 0 T 12 (SUTURE) ×3 IMPLANT
SUT PDS AB 1 TP1 96 (SUTURE) ×8 IMPLANT
SUT PROLENE 2 0 CT2 30 (SUTURE) ×3 IMPLANT
SUT SILK 2 0 SH CR/8 (SUTURE) ×4 IMPLANT
SUT SILK 2 0 TIES 10X30 (SUTURE) ×4 IMPLANT
SUT SILK 3 0 SH CR/8 (SUTURE) ×4 IMPLANT
SUT SILK 3 0 TIES 10X30 (SUTURE) ×4 IMPLANT
SUT VIC AB 3-0 SH 18 (SUTURE) ×6 IMPLANT
TAPE CLOTH SOFT 2X10 (GAUZE/BANDAGES/DRESSINGS) ×3 IMPLANT
TOWEL OR 17X24 6PK STRL BLUE (TOWEL DISPOSABLE) ×4 IMPLANT
TOWEL OR 17X26 10 PK STRL BLUE (TOWEL DISPOSABLE) ×4 IMPLANT
TRAY FOLEY CATH 14FRSI W/METER (CATHETERS) ×4 IMPLANT
TUBE CONNECTING 12'X1/4 (SUCTIONS)
TUBE CONNECTING 12X1/4 (SUCTIONS) IMPLANT
WATER STERILE IRR 1000ML POUR (IV SOLUTION) IMPLANT
YANKAUER SUCT BULB TIP NO VENT (SUCTIONS) ×4 IMPLANT

## 2013-10-09 NOTE — Anesthesia Procedure Notes (Signed)
Procedure Name: Intubation Date/Time: 10/09/2013 8:43 PM Performed by: Jacquiline Doe A Pre-anesthesia Checklist: Patient identified, Timeout performed, Emergency Drugs available, Suction available and Patient being monitored Patient Re-evaluated:Patient Re-evaluated prior to inductionOxygen Delivery Method: Circle system utilized Preoxygenation: Pre-oxygenation with 100% oxygen Intubation Type: IV induction, Rapid sequence and Cricoid Pressure applied Laryngoscope Size: Mac and 3 Grade View: Grade I Tube type: Subglottic suction tube Tube size: 7.5 mm Number of attempts: 1 Airway Equipment and Method: Stylet and LTA kit utilized Placement Confirmation: ETT inserted through vocal cords under direct vision,  breath sounds checked- equal and bilateral and positive ETCO2 Secured at: 21 cm Tube secured with: Tape Dental Injury: Teeth and Oropharynx as per pre-operative assessment

## 2013-10-09 NOTE — Progress Notes (Signed)
eLink Physician-Brief Progress Note Patient Name: [name] DOB: [birth year] MRN: [id number]  Date of Service  10/09/2013   HPI/Events of Note   Arrived ventilated from OR s/p sgy for LBO  eICU Interventions  - CXR ordered - sedation = propofol - analgesia = fentanyl, consider changing to continuous gtt - PRVC 550x12, 0.40+ 5 PEEP - ABG at 00:30   Intervention Category Major Interventions: Respiratory failure - evaluation and management  Taci Sterling S. 10/09/2013, 11:35 PM

## 2013-10-09 NOTE — Progress Notes (Signed)
78yo female was transferred from [facility] for abdominal pain, distention, and ongoing sigmoid colon obstruction, now s/p ex lap, found with microperforation, to begin IV ABX.  Will start Zosyn 3.375g IV Q8H for CrCl ~55 ml/min and monitor CBC and Cx.  Wynona Neat, PharmD, BCPS 10/09/2013 11:27 PM

## 2013-10-09 NOTE — Progress Notes (Signed)
PATIENT DETAILS Name: [name] Age: 78 y.o. Sex: female Date of Birth: 11-05-1935 Admit Date: 10/09/2013 IRW:[id number] B., MD   CHIEF COMPLAINT:  Transfer from [facility]-Abdominal Pain, distention and ongoing Sigmoid colon obstruction  HPI: [name] is a 78 y.o. female with a Past Medical History of COPD, chronic respiratory failure on 2 L of oxygen at home, permanent atrial fibrillation on chronic anticoagulation, gastroesophageal reflux disease who presents today with the above noted complaint. Apparently, approximately a few days back, patient was admitted the morning Hospital after she failed to have a bowel movement for 2 weeks, associated with abdominal pain and distention. She claims that her outpatient, her physicians tried numerous amounts of enemas, suppositories and prune juice. She was admitted the [facility], further workup revealed a possible obstruction of the sigmoid colon, subsequently a colonoscopy done today showed a sigmoid colon stricture, which could not be passed via endoscopy. Since patient continued to have ongoing abdominal pain, and worsening abdominal distention, patient was felt to require a exploratory laparotomy, given significant medical issues, patient was transferred to [facility] for surgical evaluation. Patient still has not had a bowel movement for now close to 2 weeks, however she has been able to pass some small amounts of gas. She continues to have abdominal pressure, distention and abdominal pain, she refuses to have a NG tube inserted. She claims that she has not vomited but, however she continues to retch and have dry heaves. She claims that her shortness of breath, has been currently worsened by abdominal pain, abdominal distention and ongoing anxiety ssues.  ALLERGIES:   Allergies  Allergen Reactions  . Tape Other (See Comments)    Paper tape only per family of the patient due to thinning of  skin  . Cardizem [Diltiazem Hcl] Itching and Rash  . Sulfonamide Derivatives Rash    PAST MEDICAL HISTORY: Past Medical History  Diagnosis Date  . Osteoporosis   . Diverticulosis   . Renal cyst   . C. difficile colitis     12/06  . Lumbar and sacral arthritis   . COPD (chronic obstructive pulmonary disease)     PFT 10/05/08 - FEV1 0.89(43%), FVC 1.84(62%), FEV1% 49, TLC 5.64(120%), DLCO 38%  . Hypoxemia     Nocturnal oxygen  . Atrial fibrillation   . Sick sinus syndrome     Medtronic PPM 2004  . Pulmonary nodule   . Pneumonia due to Haemophilus influenzae 03/2011    Positive blood cultures    PAST SURGICAL HISTORY: Past Surgical History  Procedure Laterality Date  . Total hip arthroplasty      Right  . Insert / replace / remove pacemaker      Medtronic  . Abdominal hysterectomy    . Bronchoscopy with washings  03/2011    mucus in right lower lobe with obstruction and some bleeding.  chroinc bronchitis, lung collapse,  Brushings showed numerous acute inflammatory cells and degenerated materal .  . Esophagogastroduodenoscopy  05/23/2011    PYP:PJKD distal esophageal erosions consistent with mild  reflux esophagitis/otherwise normal  . Colonoscopy  05/23/2011    TOI:[id number] colorectal polyps-treated/large rectal polyp likely corresponds to the area of abnormal PET activity  . Esophagogastroduodenoscopy  July 2014    Dr. Anthony Sar: retained food in stomach    MEDICATIONS AT HOME: Prior to Admission medications   Medication Sig Start Date End Date Taking? Authorizing Provider  albuterol (PROAIR HFA) 108 (90 BASE) MCG/ACT  inhaler Inhale 2 puffs into the lungs every 6 (six) hours as needed for wheezing or shortness of breath.    Yes Historical Provider, MD  ALPRAZolam (XANAX) 0.25 MG tablet Take 0.25 mg by mouth 2 (two) times daily as needed for anxiety. For anxiety   Yes Historical Provider, MD  digoxin (LANOXIN) 0.125 MG tablet Take 1 tablet (0.125 mg total) by mouth daily.  02/25/13  Yes Evans Lance, MD  fluticasone-salmeterol (ADVAIR HFA) [phone] MCG/ACT inhaler Inhale 2 puffs into the lungs 2 (two) times daily.     Yes Historical Provider, MD  HYDROcodone-acetaminophen (NORCO/VICODIN) 5-325 MG per tablet Take 1 tablet by mouth every 4 (four) hours as needed for moderate pain.    Yes Historical Provider, MD  metoprolol tartrate (LOPRESSOR) 25 MG tablet Take 25 mg by mouth 2 (two) times daily.    Yes Historical Provider, MD  raloxifene (EVISTA) 60 MG tablet Take 60 mg by mouth every morning.    Yes Historical Provider, MD  tiotropium (SPIRIVA) 18 MCG inhalation capsule Place 18 mcg into inhaler and inhale at bedtime.    Yes Historical Provider, MD  verapamil (CALAN-SR) 120 MG CR tablet Take 1 tablet (120 mg total) by mouth 2 (two) times daily. 04/06/13  Yes Evans Lance, MD  warfarin (COUMADIN) 5 MG tablet Take 2.5-5 mg by mouth daily. As directed by coumadin clinic 5 mg on Mon-Wed-Fri-Sat 2.5 mg on all other days   Yes Historical Provider, MD  furosemide (LASIX) 40 MG tablet Take 40 mg by mouth daily as needed. Fluid retention    Historical Provider, MD  ipratropium-albuterol (DUONEB) 0.5-2.5 (3) MG/3ML SOLN Take 3 mLs by nebulization daily as needed (for shortness of breath/wheezing).    Historical Provider, MD  pantoprazole (PROTONIX) 40 MG tablet Take 40 mg by mouth 2 (two) times daily.     Historical Provider, MD  predniSONE (DELTASONE) 10 MG tablet Take 10 mg by mouth 2 (two) times daily with a meal.    Historical Provider, MD    FAMILY HISTORY: Family History  Problem Relation Age of Onset  . Lung cancer Mother     ???patient states she had cancer but not sure what type  . Heart disease Brother   . Colon cancer Neg Hx     SOCIAL HISTORY:  reports that she has quit smoking. Her smoking use included Cigarettes. She has a 20 pack-year smoking history. She has never used smokeless tobacco. She reports that she does not drink alcohol or use illicit  drugs.  REVIEW OF SYSTEMS:  Constitutional:   No  weight loss, night sweats,  Fevers, chills, fatigue.  HEENT:    No headaches, Difficulty swallowing,Tooth/dental problems,Sore throat,  No sneezing, itching, ear ache, nasal congestion, post nasal drip,   Cardio-vascular: No chest pain,  Orthopnea, PND, swelling in lower extremities, anasarca,   dizziness, palpitations  GI:  No heartburn, indigestion  Resp: No change in color of mucus.No wheezing.No chest wall deformity. No hemoptysis  Skin:  no rash or lesions.  GU:  no dysuria, change in color of urine, no urgency or frequency.  No flank pain.  Musculoskeletal: No joint pain or swelling.  No decreased range of motion.  No back pain.  Psych: No change in mood or affect. No depression or anxiety.  No memory loss.   PHYSICAL EXAM: Blood pressure 137/76, pulse 81, temperature 98.5 F (36.9 C), temperature source Oral, resp. rate 24, height 5\' 5"  (1.651 m), weight 63.2 kg (139  lb 5.3 oz), SpO2 15.00%.  General appearance :Awake, alert, not in any distress. Speech Clear. Not toxic Looking. On a Ventimask. HEENT: Atraumatic and Normocephalic, pupils equally reactive to light and accomodation Neck: supple, no JVD. No cervical lymphadenopathy.  Chest: Moving air bilaterally, however decreased entry at both bases, mostly clear to auscultation with only a few scattered rhonchi.  CVS: S1 S2 irregular, no murmurs.  Abdomen: Bowel sounds are very sluggish, Moderately distended, and diffusely tender-especially in the lower abdomen area. Extremities: B/L Lower Ext shows no edema, both legs are warm to touch Neurology: Awake alert, and oriented X 3, CN II-XII intact, Non focal Skin:No Rash Wounds:N/A  LABS ON ADMISSION:  No results found for this basename: NA, K, CL, CO2, GLUCOSE, BUN, CREATININE, CALCIUM, MG, PHOS,  in the last 72 hours No results found for this basename: AST, ALT, ALKPHOS, BILITOT, PROT, ALBUMIN,  in the last 72  hours No results found for this basename: LIPASE, AMYLASE,  in the last 72 hours No results found for this basename: WBC, NEUTROABS, HGB, HCT, MCV, PLT,  in the last 72 hours No results found for this basename: CKTOTAL, CKMB, CKMBINDEX, TROPONINI,  in the last 72 hours No results found for this basename: DDIMER,  in the last 72 hours No components found with this basename: POCBNP,    RADIOLOGIC STUDIES ON ADMISSION: No results found.   EKG: pending  ASSESSMENT AND PLAN: Present on Admission:  . Bowel obstruction - Secondary to sigmoid colon stricture.  - Labs from [place] reviewed, significant leukocytosis today, abdomen is diffusely tender. Suspect she will a require exploratory laparotomy soon. Gen. surgery-Dr. Greer Pickerel made aware of patient's admission. Keep n.p.o., will place NG tube if patient consents, continue IV Zosyn, and await general surgery evaluation and recommendations   . Acute on Chronic respiratory failure - Currently on a venti mask- significantly more oxygen requirement than baseline. Suspect this is from abdominal distention, abdominal pain. We'll continue to monitor, start nebulized bronchodilators. Follow clinical course.   Marland Kitchen COPD - No evidence of exacerbation, although with acute on chronic respiratory failure-see above.  - On chronic steroids, will continue with scheduled nebulized bronchodilators, Advair and Spiriva.   . Essential hypertension, benign -: Oral medications, will try and attempt to control hypertension with IV Lopressor.   Marland Kitchen Permanent atrial fibrillation - Coumadin reversed at [facility], will recheck INR today. Continue to monitor off anticoagulation for now, as patient will likely need a laparotomy soon. We'll attempt rate control with IV Lopressor, and IV digoxin.  - Will obtain digoxin levels.   Marland Kitchen SICK SINUS SYNDROME - Has a pacemaker in place, monitor in telemetry  Further plan will depend as patient's clinical course evolves  and further radiologic and laboratory data become available. Patient will be monitored closely.  Above noted plan was discussed with patient/sister, they were in agreement.   DVT Prophylaxis: Prophylactic  Heparin  Code Status: Full Code  Total time spent for admission equals 45 minutes.  [facility] Pager [phone]  If 7PM-7AM, please contact night-coverage [email or web address] Password [facility] 10/09/2013, 5:38 PM  **Disclaimer: This note may have been dictated with voice recognition software. Similar sounding words can inadvertently be transcribed and this note may contain transcription errors which may not have been corrected upon publication of note.**

## 2013-10-09 NOTE — Consult Note (Signed)
Reason for Consult:large bowel obstruction Referring Physician: Dr Manuella Ghazi is an 78 y.o. female.  HPI: 78 yo WF with COPD on home o2, sick sinus syndrome s/p pacemaker insertion, a fib on chronic anticoagulation (now reversed) present to [facility] on 7/17 with c/o of severe constipation. Last BM was 2 weeks ago. Pt has tried numerous enemas, suppositories, mag citrate, prune juice -all without any relief. Normally has a BM qoday. Pt denies any changes to her stool pattern until 2 weeks ago. Reports a little flatus. Pt has had progressive distension and abd pain over the past several days. Saw her doctor and was given more enemas without any success. Pt admitted to [facility] on 7/17 where ct showed distal large bowel obstruction, wbc 16, INR 4.3. Her INR was reversed. Surgery was consulted up there. Today her wbc was 19, hgb 10.4, hct 34, plt 265; inr 1.4.  A colonoscopy was attempted but a stricture was encountered and unable to pass scope thru area. Pt was recommended to have exp lap there by surgeon however, supporting consulting services (ICU/pulmonary management not available) and we were contacted to accept pt. Pt had prior colonoscopy in 2013 which showed extensive diverticulosis and large rectal polyp which was removed. Pt denies wt loss, melena/hematochezia. She has been heaving and retching as well.   Past Medical History  Diagnosis Date  . Osteoporosis   . Diverticulosis   . Renal cyst   . C. difficile colitis     12/06  . Lumbar and sacral arthritis   . COPD (chronic obstructive pulmonary disease)     PFT 10/05/08 - FEV1 0.89(43%), FVC 1.84(62%), FEV1% 49, TLC 5.64(120%), DLCO 38%  . Hypoxemia     Nocturnal oxygen  . Atrial fibrillation   . Sick sinus syndrome     Medtronic PPM 2004  . Pulmonary nodule   . Pneumonia due to Haemophilus influenzae 03/2011    Positive blood cultures    Past Surgical History  Procedure Laterality Date  . Total hip  arthroplasty      Right  . Insert / replace / remove pacemaker      [employer]tr[employer]  . Abdominal hysterectomy    . Bronchoscopy with washings  03/2011    mucus in right lower lobe with obstruction and some bleeding.  chroinc bronchitis, lung collapse,  Brushings showed numerous acute inflammatory cells and degenerated materal .  . Esophagogastroduodenoscopy  05/23/2011    IRS:WNIO distal esophageal erosions consistent with mild  reflux esophagitis/otherwise normal  . Colonoscopy  05/23/2011    EVO:[id number] colorectal polyps-treated/large rectal polyp likely corresponds to the area of abnormal PET activity  . Esophagogastroduodenoscopy  July 2014    Dr. Anthony Sar: retained food in stomach    Family History  Problem Relation Age of Onset  . Lung cancer Mother     ???patient states she had cancer but not sure what type  . Heart disease Brother   . Colon cancer Neg Hx     Social History:  reports that she has quit smoking. Her smoking use included Cigarettes. She has a 20 pack-year smoking history. She has never used smokeless tobacco. She reports that she does not drink alcohol or use illicit drugs.  Allergies:  Allergies  Allergen Reactions  . Tape Other (See Comments)    Paper tape only per family of the patient due to thinning of skin  . Cardizem [Diltiazem Hcl] Itching and Rash  . Sulfonamide Derivatives Rash    Medications:  Prior to Admission:  Prescriptions prior to admission  Medication Sig Dispense Refill  . albuterol (PROAIR HFA) 108 (90 BASE) MCG/ACT inhaler Inhale 2 puffs into the lungs every 6 (six) hours as needed for wheezing or shortness of breath.       . ALPRAZolam (XANAX) 0.25 MG tablet Take 0.25 mg by mouth 2 (two) times daily as needed for anxiety. For anxiety      . digoxin (LANOXIN) 0.125 MG tablet Take 1 tablet (0.125 mg total) by mouth daily.  90 tablet  1  . fluticasone-salmeterol (ADVAIR HFA) 115-21 MCG/ACT inhaler Inhale 2 puffs into the lungs 2 (two) times  daily.        Marland Kitchen HYDROcodone-acetaminophen (NORCO/VICODIN) 5-325 MG per tablet Take 1 tablet by mouth every 4 (four) hours as needed for moderate pain.       . metoprolol tartrate (LOPRESSOR) 25 MG tablet Take 25 mg by mouth 2 (two) times daily.       . raloxifene (EVISTA) 60 MG tablet Take 60 mg by mouth every morning.       . tiotropium (SPIRIVA) 18 MCG inhalation capsule Place 18 mcg into inhaler and inhale at bedtime.       . verapamil (CALAN-SR) 120 MG CR tablet Take 1 tablet (120 mg total) by mouth 2 (two) times daily.  60 tablet  6  . warfarin (COUMADIN) 5 MG tablet Take 2.5-5 mg by mouth daily. As directed by coumadin clinic 5 mg on Mon-Wed-Fri-Sat 2.5 mg on all other days      . furosemide (LASIX) 40 MG tablet Take 40 mg by mouth daily as needed. Fluid retention      . ipratropium-albuterol (DUONEB) 0.5-2.5 (3) MG/3ML SOLN Take 3 mLs by nebulization daily as needed (for shortness of breath/wheezing).      . pantoprazole (PROTONIX) 40 MG tablet Take 40 mg by mouth 2 (two) times daily.       . predniSONE (DELTASONE) 10 MG tablet Take 10 mg by mouth 2 (two) times daily with a meal.        No results found for this or any previous visit (from the past 48 hour(s)).  No results found.  Review of Systems  Constitutional: Negative for fever and chills.       Hasn't fully recovered from hip surgery in 5/15 - not walking independently at stores.   Respiratory: Positive for shortness of breath. Negative for cough and sputum production.        Chronic SOB, on home O2  Cardiovascular: Positive for orthopnea. Negative for chest pain and leg swelling.       Afib; on chronic coumadin; pacemaker  Gastrointestinal: Positive for nausea, abdominal pain and constipation. Negative for vomiting, blood in stool and melena.  Genitourinary: Negative for dysuria and urgency.  Neurological: Positive for weakness. Negative for sensory change, seizures and loss of consciousness.       Denies TIAs/amaruosis  fugax  Psychiatric/Behavioral: Negative for substance abuse.   Blood pressure 137/76, pulse 81, temperature 98.5 F (36.9 C), temperature source Oral, resp. rate 24, height 5\' 5"  (1.651 m), weight 139 lb 5.3 oz (63.2 kg), SpO2 15.00%. Physical Exam  Vitals reviewed. Constitutional: She is oriented to person, place, and time. She appears well-developed and well-nourished. She is cooperative. She appears ill. She appears distressed. Face mask in place.  Appears uncomfortable  HENT:  Head: Normocephalic and atraumatic.  Right Ear: External ear normal.  Left Ear: External ear normal.  Eyes: Conjunctivae are normal.  No scleral icterus.  Neck: Normal range of motion. Neck supple. No tracheal deviation present. No thyromegaly present.  Cardiovascular: Normal rate, normal heart sounds and intact distal pulses.   Respiratory: Effort normal and breath sounds normal. No stridor. No respiratory distress. She has no wheezes.  GI: Soft. She exhibits distension. There is tenderness. There is guarding.  Protuberant upper abd; diffusely TTP throughout, +involuntary guarding; winces and gasps with any bed movement. Very TTP in lower abd; LLQ; localized peritonitis in lower abd  Musculoskeletal: She exhibits no edema and no tenderness.  Lymphadenopathy:    She has no cervical adenopathy.  Neurological: She is alert and oriented to person, place, and time. She exhibits normal muscle tone.  Skin: Skin is warm and dry. No rash noted. She is not diaphoretic. No erythema. There is pallor.  Psychiatric: She has a normal mood and affect. Her behavior is normal. Judgment and thought content normal.    Assessment/Plan: Distal large bowel obstruction with localized peritonitis Severe COPD on home O2 Cardiac pacemaker H/o sick sinus syndrome HTN PCMN Leukocytosis  I reviewed pt's CT with Dr Purcell Nails in radiology. Has what appears to be stricture in distal sigmoid in rectum/pelvis. Pt has tortuous rectum as  well. Upstream from area of probable stricture is significant bowel dilatation. Given pt's current exam which is localized peritonitis, worsening wbc this am, inability for outside surgeon to traverse area with endoscope, I think safest course of action is to proceed to OR for exp lap, Hartman's procedure. I do not feel another attempt at endoscopic decompression should be attempted given her current exam.   I discussed the procedure in detail.  T  We discussed the risks and benefits of surgery including, but not limited to bleeding, infection (such as wound infection, abdominal abscess), injury to surrounding structures, blood clot formation, urinary retention, incisional hernia, parastomal hernia,  anesthesia risks, pulmonary & cardiac complications such as pneumonia &/or heart attack, need for additional procedures, ileus, & prolonged hospitalization and death.  We discussed the typical postoperative recovery course, including limitations & restrictions postoperatively. I explained that the likelihood of improvement in their symptoms is good but risks/complications moderate to high given urgent nature of surgery and patient co-morbidities.   Briefly discussed end of life wishes with pt in event of multiple complications - " i don't want to be on prolonged life support if no chance I'll get better"  Pt's family at bedside (daughter, grand-daughter, family friend)  Leighton Ruff. Redmond Pulling, MD, FACS General, Bariatric, & Minimally Invasive Surgery [facility], [place]   [facility] M 10/09/2013, 6:37 PM

## 2013-10-09 NOTE — Progress Notes (Signed)
Admitted patient via stretcher by [facility],awake,alert and oriented x 3,on venturi mask 15 liters with oxygen percentage of 96-100%.Not in distress, skin is thin and wrinkled on upper bilateralextremities with multiple scattered dark red a,reddish bruises ,per patient ,she is taking( Coumadin).Bilateral lower extremities are with multiple dark reddish  Patches,other wise skin is intact on all extremities.Sacral area and both buttocks is reddish but blanchable,otherwise skin is intact.Innermost the right gluteal fold near peri-anal area there is small rounded closed wound-.2x .2. no drainage,grayish white scab over and no smell,surrounded area is reddish,blanchable,skin is intact.

## 2013-10-09 NOTE — Progress Notes (Addendum)
ANTIBIOTIC CONSULT NOTE - INITIAL  Pharmacy Consult for Zosyn Indication: empiric  Allergies  Allergen Reactions  . Tape Other (See Comments)    Paper tape only per family of the patient due to thinning of skin  . Cardizem [Diltiazem Hcl] Itching and Rash  . Sulfonamide Derivatives Rash    Patient Measurements: Height: 5\' 5"  (165.1 cm) Weight: 139 lb 5.3 oz (63.2 kg) IBW/kg (Calculated) : 57 Adjusted Body Weight: 63.2 kg  Vital Signs: Temp: 98.5 F (36.9 C) (07/19 1704) Temp src: Oral (07/19 1704) BP: 137/76 mmHg (07/19 1704) Pulse Rate: 81 (07/19 1704) Intake/Output from previous day:   Intake/Output from this shift:    Labs: No results found for this basename: WBC, HGB, PLT, LABCREA, CREATININE,  in the last 72 hours Estimated Creatinine Clearance: 54.7 ml/min (by C-G formula based on Cr of 0.8). No results found for this basename: VANCOTROUGH, VANCOPEAK, VANCORANDOM, GENTTROUGH, GENTPEAK, GENTRANDOM, TOBRATROUGH, TOBRAPEAK, TOBRARND, AMIKACINPEAK, AMIKACINTROU, AMIKACIN,  in the last 72 hours   From [facility] today - WBC = 16, H/H 12.5/39.5. Pltc 336.  INR 4.3, Scr 0.6  Microbiology: No results found for this or any previous visit (from the past 720 hour(s)).  Medical History: Past Medical History  Diagnosis Date  . Osteoporosis   . Diverticulosis   . Renal cyst   . C. difficile colitis     12/06  . Lumbar and sacral arthritis   . COPD (chronic obstructive pulmonary disease)     PFT 10/05/08 - FEV1 0.89(43%), FVC 1.84(62%), FEV1% 49, TLC 5.64(120%), DLCO 38%  . Hypoxemia     Nocturnal oxygen  . Atrial fibrillation   . Sick sinus syndrome     Medtronic PPM 2004  . Pulmonary nodule   . Pneumonia due to Haemophilus influenzae 03/2011    Positive blood cultures    Medications:  Scheduled:  . antiseptic oral rinse  15 mL Mouth Rinse BID  . digoxin  0.125 mg Intravenous Daily  . heparin  5,000 Units Subcutaneous 3 times per day  . ipratropium-albuterol  3  mL Nebulization TID  . metoprolol  2.5 mg Intravenous 4 times per day  . mometasone-formoterol  2 puff Inhalation BID  .  morphine injection  2 mg Intravenous Once  . pantoprazole (PROTONIX) IV  40 mg Intravenous Q24H   Assessment: 78 yo female transferred from [facility] with abdominal pain and sigmoid colon obstruction.  Pharmacy asked to begin empiric antibiotic coverage with Zosyn.  Scr from [facility] was 0.6 today, Estimated CrCl ~ 55 ml/min  Goal of Therapy:  Empiric prevention of infection  Plan:  1. Zosyn 3.375g IV q 8 hrs (4 hr extended infusion) 2. Will f/u LOT of antibiotics and plan of care.  Uvaldo Rising, BCPS  Clinical Pharmacist Pager [phone]  10/09/2013 7:20 PM    Addendum:  Spoke with Dr. Redmond Pulling, he wishes to d/c Zosyn.  Uvaldo Rising, BCPS  Clinical Pharmacist Pager [phone]  10/09/2013 7:35 PM

## 2013-10-09 NOTE — Brief Op Note (Addendum)
10/09/2013  10:49 PM  PATIENT:  [name]  78 y.o. female  PRE-OPERATIVE DIAGNOSIS:  Distal large bowel obstruction  POST-OPERATIVE DIAGNOSIS:  distal sigmoid obstruction with perforation  PROCEDURE:  Procedure(s): EXPLORATORY LAPAROTOMY, MOBILIZATION OF SPLENIC FLEXTURE, HARTMANS PROCEDURE (N/A)  SURGEON:  Surgeon(s) and Role:    * Gayland Curry, MD - Primary    * Leighton Ruff, MD - Assisting  PHYSICIAN ASSISTANT: none  ASSISTANTS: see above   ANESTHESIA:   general  EBL:  Total I/O In: 1300 [I.V.:1050; IV Piggyback:250] Out: 650 [Urine:400; Blood:250]  BLOOD ADMINISTERED:none  DRAINS: Nasogastric Tube and Urinary Catheter (Foley)   LOCAL MEDICATIONS USED:  NONE  SPECIMEN:  Source of Specimen:  sigmoid colon, stitch proximal  DISPOSITION OF SPECIMEN:  PATHOLOGY  COUNTS:  YES  TOURNIQUET:  * No tourniquets in log *  DICTATION: .Other Dictation: Dictation Number [id number]  PLAN OF CARE: ICU  PATIENT DISPOSITION:  ICU - intubated and hemodynamically stable.   Delay start of Pharmacological VTE agent (>24hrs) due to surgical blood loss or risk of bleeding: no  Leighton Ruff. Redmond Pulling, MD, FACS General, Bariatric, & Minimally Invasive Surgery [facility], [place]

## 2013-10-09 NOTE — Anesthesia Preprocedure Evaluation (Addendum)
Anesthesia Evaluation  Patient identified by MRN, date of birth, ID band Patient awake    Reviewed: Allergy & Precautions, H&P , NPO status , Patient's Chart, lab work & pertinent test results  Airway       Dental   Pulmonary asthma , COPDformer smoker,          Cardiovascular hypertension, +CHF + dysrhythmias Atrial Fibrillation + pacemaker     Neuro/Psych    GI/Hepatic   Endo/Other    Renal/GU Renal InsufficiencyRenal disease     Musculoskeletal   Abdominal   Peds  Hematology   Anesthesia Other Findings SSS  bowel obstruction Post op ICU and Ventilation  Reproductive/Obstetrics                        Anesthesia Physical Anesthesia Plan  ASA: III and emergent  Anesthesia Plan: General   Post-op Pain Management:    Induction: Intravenous  Airway Management Planned: Oral ETT  Additional Equipment: Arterial line and CVP  Intra-op Plan:   Post-operative Plan: Post-operative intubation/ventilation  Informed Consent: I have reviewed the patients History and Physical, chart, labs and discussed the procedure including the risks, benefits and alternatives for the proposed anesthesia with the patient or authorized representative who has indicated his/her understanding and acceptance.     Plan Discussed with: CRNA, Anesthesiologist and Surgeon  Anesthesia Plan Comments:        Anesthesia Quick Evaluation

## 2013-10-09 NOTE — Transfer of Care (Signed)
Immediate Anesthesia Transfer of Care Note  Patient: [name]  Procedure(s) Performed: Procedure(s): EXPLORATORY LAPAROTOMY, LOCALIZATION OF SPLENIC FLEXTURE, HARTMANS PROCEDURE (N/A)  Patient Location: SICU  Anesthesia Type:General  Level of Consciousness: sedated and Patient remains intubated per anesthesia plan  Airway & Oxygen Therapy: Patient remains intubated per anesthesia plan and Patient placed on Ventilator (see vital sign flow sheet for setting)  Post-op Assessment: Report given to PACU RN and Post -op Vital signs reviewed and stable  Post vital signs: Reviewed and stable  Complications: No apparent anesthesia complications

## 2013-10-10 ENCOUNTER — Inpatient Hospital Stay (HOSPITAL_COMMUNITY): Payer: Medicare Other

## 2013-10-10 DIAGNOSIS — I4891 Unspecified atrial fibrillation: Secondary | ICD-10-CM

## 2013-10-10 DIAGNOSIS — K56609 Unspecified intestinal obstruction, unspecified as to partial versus complete obstruction: Secondary | ICD-10-CM

## 2013-10-10 DIAGNOSIS — J449 Chronic obstructive pulmonary disease, unspecified: Secondary | ICD-10-CM

## 2013-10-10 DIAGNOSIS — J9600 Acute respiratory failure, unspecified whether with hypoxia or hypercapnia: Secondary | ICD-10-CM

## 2013-10-10 LAB — COMPREHENSIVE METABOLIC PANEL
ALK PHOS: 75 U/L (ref 39–117)
ALT: 7 U/L (ref 0–35)
AST: 14 U/L (ref 0–37)
Albumin: 2 g/dL — ABNORMAL LOW (ref 3.5–5.2)
Anion gap: 14 (ref 5–15)
BUN: 9 mg/dL (ref 6–23)
CO2: 28 mEq/L (ref 19–32)
Calcium: 7.9 mg/dL — ABNORMAL LOW (ref 8.4–10.5)
Chloride: 93 mEq/L — ABNORMAL LOW (ref 96–112)
Creatinine, Ser: 0.42 mg/dL — ABNORMAL LOW (ref 0.50–1.10)
GFR calc non Af Amer: >90 mL/min (ref >90)
GLUCOSE: 114 mg/dL — AB (ref 70–99)
POTASSIUM: 4.4 meq/L (ref 3.7–5.3)
SODIUM: 135 meq/L — AB (ref 137–147)
Total Bilirubin: 1.5 mg/dL — ABNORMAL HIGH (ref 0.3–1.2)
Total Protein: 4.7 g/dL — ABNORMAL LOW (ref 6.0–8.3)

## 2013-10-10 LAB — PHOSPHORUS: Phosphorus: 2.4 mg/dL (ref 2.3–4.6)

## 2013-10-10 LAB — CBC
HCT: 37.2 % (ref 36.0–46.0)
HEMOGLOBIN: 11.1 g/dL — AB (ref 12.0–15.0)
MCH: 26.7 pg (ref 26.0–34.0)
MCHC: 29.8 g/dL — ABNORMAL LOW (ref 30.0–36.0)
MCV: 89.6 fL (ref 78.0–100.0)
PLATELETS: 269 10*3/uL (ref 150–400)
RBC: 4.15 MIL/uL (ref 3.87–5.11)
RDW: 16.3 % — ABNORMAL HIGH (ref 11.5–15.5)
WBC: 20.6 10*3/uL — ABNORMAL HIGH (ref 4.0–10.5)

## 2013-10-10 LAB — POCT I-STAT 7, (LYTES, BLD GAS, ICA,H+H)
Acid-Base Excess: 5 mmol/L — ABNORMAL HIGH (ref 0.0–2.0)
Bicarbonate: 30.9 mEq/L — ABNORMAL HIGH (ref 20.0–24.0)
CALCIUM ION: 1.13 mmol/L (ref 1.13–1.30)
HCT: 30 % — ABNORMAL LOW (ref 36.0–46.0)
Hemoglobin: 10.2 g/dL — ABNORMAL LOW (ref 12.0–15.0)
O2 SAT: 99 %
POTASSIUM: 3.7 meq/L (ref 3.7–5.3)
Patient temperature: 36.3
SODIUM: 131 meq/L — AB (ref 137–147)
TCO2: 33 mmol/L (ref 0–100)
pCO2 arterial: 51.6 mmHg — ABNORMAL HIGH (ref 35.0–45.0)
pH, Arterial: 7.383 (ref 7.350–7.450)
pO2, Arterial: 134 mmHg — ABNORMAL HIGH (ref 80.0–100.0)

## 2013-10-10 LAB — POCT I-STAT 3, ART BLOOD GAS (G3+)
ACID-BASE EXCESS: 3 mmol/L — AB (ref 0.0–2.0)
BICARBONATE: 29.7 meq/L — AB (ref 20.0–24.0)
O2 SAT: 93 %
PO2 ART: 68 mmHg — AB (ref 80.0–100.0)
TCO2: 31 mmol/L (ref 0–100)
pCO2 arterial: 52.3 mmHg — ABNORMAL HIGH (ref 35.0–45.0)
pH, Arterial: 7.361 (ref 7.350–7.450)

## 2013-10-10 LAB — TRIGLYCERIDES: Triglycerides: 127 mg/dL (ref <150)

## 2013-10-10 LAB — GLUCOSE, CAPILLARY: Glucose-Capillary: 143 mg/dL — ABNORMAL HIGH (ref 70–99)

## 2013-10-10 LAB — MAGNESIUM: Magnesium: 1.5 mg/dL (ref 1.5–2.5)

## 2013-10-10 MED ORDER — DILTIAZEM HCL 100 MG IV SOLR
5.0000 mg/h | INTRAVENOUS | Status: DC
  Administered 2013-10-10 (×3): 10 mg/h via INTRAVENOUS
  Administered 2013-10-11 – 2013-10-14 (×9): 15 mg/h via INTRAVENOUS
  Filled 2013-10-10 (×3): qty 100

## 2013-10-10 MED ORDER — MAGNESIUM SULFATE 40 MG/ML IJ SOLN
2.0000 g | Freq: Once | INTRAMUSCULAR | Status: AC
  Administered 2013-10-10: 2 g via INTRAVENOUS
  Filled 2013-10-10: qty 50

## 2013-10-10 MED ORDER — SODIUM CHLORIDE 0.9 % IJ SOLN: 10.0000 mL | INTRAMUSCULAR | Status: DC | PRN

## 2013-10-10 MED ORDER — CHLORHEXIDINE GLUCONATE 0.12 % MT SOLN
15.0000 mL | Freq: Two times a day (BID) | OROMUCOSAL | Status: DC
  Administered 2013-10-10 – 2013-10-14 (×9): 15 mL via OROMUCOSAL
  Filled 2013-10-10 (×13): qty 15

## 2013-10-10 MED ORDER — SODIUM CHLORIDE 0.9 % IJ SOLN
10.0000 mL | Freq: Two times a day (BID) | INTRAMUSCULAR | Status: DC
  Administered 2013-10-10 – 2013-10-12 (×5): 10 mL
  Administered 2013-10-13: 40 mL
  Administered 2013-10-13 – 2013-10-14 (×3): 10 mL
  Filled 2013-10-10: qty 30

## 2013-10-10 MED ORDER — SODIUM CHLORIDE 0.9 % IV SOLN
INTRAVENOUS | Status: DC
  Administered 2013-10-10: 10:00:00 via INTRAVENOUS

## 2013-10-10 MED ORDER — FUROSEMIDE 10 MG/ML IJ SOLN
40.0000 mg | Freq: Three times a day (TID) | INTRAMUSCULAR | Status: AC
  Administered 2013-10-10 (×2): 40 mg via INTRAVENOUS
  Filled 2013-10-10 (×2): qty 4

## 2013-10-10 MED ORDER — DILTIAZEM LOAD VIA INFUSION
10.0000 mg | Freq: Once | INTRAVENOUS | Status: AC
  Administered 2013-10-10: 10 mg via INTRAVENOUS
  Filled 2013-10-10: qty 10

## 2013-10-10 NOTE — Progress Notes (Signed)
Utilization review completed. Amberle Lyter, RN, BSN. 

## 2013-10-10 NOTE — Consult Note (Signed)
WOC ostomy consult note Stoma type/location:  LLQ, end colostomy Stomal assessment/size: 2" round, budded, edematous, pink and moist Peristomal assessment: pouch intact from OR Treatment options for stomal/peristomal skin: NA Output scant bloody drainage in the pouch today Ostomy pouching: 2pc. 2 3/4" in place, will continue to use this until stoma shrinks a bit.  Education provided: reviewed creation of ostomy with daughter and granddaughter at the bedside and the role of Omer nurse.  Pt vented and sedated but does open her eyes and respond to family.  WOC will follow along with you for ostomy care and teaching. Cheryal Salas [place] RN,CWOCN [phone]

## 2013-10-10 NOTE — Op Note (Signed)
[name], [name] NO.:  [id number]  MEDICAL RECORD NO.:  [id number]  LOCATION:  2S11C                        FACILITY:  [place]  PHYSICIAN:  Leighton Ruff. Redmond Pulling, MD, FACSDATE OF BIRTH:  1935/04/22  DATE OF PROCEDURE:  10/09/2013 DATE OF DISCHARGE:                              OPERATIVE REPORT   PREOPERATIVE DIAGNOSIS:  Distal large-bowel obstruction.  POSTOPERATIVE DIAGNOSIS:  Distal sigmoid obstruction with perforation.  PROCEDURE: 1. Exploratory laparotomy. 2. Hartmann procedure. 3. Mobilization of splenic flexure.  SURGEON:  Leighton Ruff. Redmond Pulling, MD, FACS.  ASSISTANT SURGEON:  Leighton Ruff, MD  ANESTHESIA:  General.  EBL:  250 mL.  SPECIMEN:  Sigmoid colon, stitch marks proximal margin.  FINDINGS:  The patient had a very redundant descending as well as sigmoid colon.  She had focal dilation of her sigmoid colon, down to her upper rectum.  In the upper rectum, there was acute stricture with small perforation.  Proximal to the stricture, there is ischemic colon involving the majority of the sigmoid colon.  The cecum was grossly distended and had several serosal tears.  It was nonruptured.  In the area of the sigmoid colon that was grossly dilated just above the stricture, there was evidence of a small perforation that had tried to seal itself off with some terminal ileum. There was some purulent fluid in the abdomen.  The small bowel was grossly normal.  The ascending colon, and transverse colon were dilated, but not to the degree of the cecum.  The Hartmann's procedure was done.  The rectal stump was tagged with two 2-0 Prolene sutures. The left Prolene suture was tacked to some of the peritoneum at the pelvic inlet.  Seprafilm was placed prior to fascial closure.  INDICATIONS FOR PROCEDURE:  The patient was a 78 year old female on chronic oxygen with COPD as well as sick sinus syndrome, who has known diverticular disease.  She has not had a bowel  movement in 2 weeks despite multiple attempts with laxatives and enemas.  She presented to [facility] on the October 07, 2013, because of failure to have a bowel movement and worsening pain.  There, she was found to have a white count of 16,000 and was coagulopathic due to her being on Coumadin.  Her INR was reversed.  CT scan there demonstrated a very distal large-bowel obstruction.  Once her INR was normalized, general surgery at [facility] attempted to do a flexible sigmoidoscopy, but was unable to completed it due to encountering an acute stricture and not able to pass the area. At that point, the outside surgeon recommended a laparotomy, however, due to the patient's comorbidities, ICU support staff not available, and they were  requested her to be transferred to [facility] for care and the postoperative management she would need.  When I saw the patient, she was complaining of lower abdominal pain as well as left lower quadrant pain, any movement in the bed would make her grown.  On exam, she had a protuberant upper abdomen and was grossly tender throughout her entire abdomen, but had localized peritonitis in her left lower quadrant suprapubic area.  Given her white count of 20,000 today for worsening abdominal exam in the failure  to traverse the area endoscopically at outside hospital, I recommended proceeding to the operating room.  We discussed the risks and benefits of surgery including, but not limited to, bleeding, infection, injury to surrounding structures, incisional hernia, parastomal hernia, the need for diverting colostomy, colostomy issues such as prolapse, ischemia, ileus, intraabdominal abscess, prolonged ventilator dependence, perioperative cardiac and pulmonary complications, death.  We talked about the typical recovery as well as possible eventual reversal abdominal line.  All of her questions were asked and answered.  There was an abundant number of family  members at the bedside who were present for the discussion.  The patient elected to proceed to the operating room.  DESCRIPTION OF PROCEDURE:  After obtaining informed consent, the patient was taken urgently to the operating room 2 at [facility] placed supine on the operating table.  General endotracheal anesthesia was established.  A Foley catheter was placed.  Anesthesia placed, a right internal jugular central line as well as a left radial A-line.  Her abdomen was prepped and draped in usual standard surgical fashion.  She received cefotetan prior to skin incision.  A surgical time-out was performed.  She had a prior lower midline incision from her abdominal hysterectomy.  I incised the old incision and carried it above her umbilicus for about 2 inches.  The subcutaneous tissue was irrigated. She did have some edema in her subcutaneous tissue.  The fascia in her upper midline was quite attenuated.  The fascia in lower midline was not terribly thick, but it was in better condition.  The abdominal cavity was entered.  A Balfour retractor was placed.  She was placed slightly in Trendelenburg position.  The small bowel was eviscerated.  She had a very large cecum with  2 serosal tears already evident on the cecum, however, the integrity of the wall of the cecum appeared quite viable and intact.  She had a very redundant left colon and sigmoid colon.  I was able to lift the distal sigmoid colon, up out of the pelvis.  This was revealed a normal rectum, and just below the pelvic inlet, there was an acute transition from dilated sigmoid colon to the rectum with a normal caliber rectum.  There was evidence of some exudate on the terminal ileum which had been sort of stop to this section of the sigmoid colon with the perforation, and when it was finger fractured, there was evidence of a hole in the distal sigmoid colon. There was some thin purulent exudate in the right pericolic gutter  and in the pelvis.  I first elected to transect the upper rectum.  Green Contour staple was passed easily and fired and this freed the rectum.  I then took down the mesentery in a serial fashion using LigaSure device staying close to the bowel wall.  The white line of Toldt was mobilized with Bovie electrocautery as well as with finger dissection.  The proximal sigmoid colon and descending colon was also ischemic appearing, and therefore I thought I needed to take it as well just because it just did not look terribly healthy.  We continued to mobilize along the white line of Toldt, taking down the lateral attachments.  We took down the splenic colic ligament, there was a little bit of bleeding from the inferior pole of the spleen, however, hemostasis was achieved with electrocautery.  Once we immobilized this left normal appearing proximal left colon, I found an area transition from where it turned sort of ischemic looking  to normal coloring.  It was divided with a GIA 75 stapler with a blue load.  The remaining mesentery was taken down in a serial fashion on the sigmoid colon, staying close to the bowel wall with a LigaSure device.  This freed the specimen, a stitch was used to mark the proximal margin, it was passed off the field.  We then irrigated the pelvis and pericolic gutter.  We inspected for hemostasis which there was.  The rectal stump was tagged with two 2-0 Prolenes. The prolene on the left was tacked to the peritoneum at the pelvic inlet.  On the right, it was just tacked in the staple line.  Reinspected the cecum and the terminal ileum, there was no evidence of leakage or perforation.  I elected to leave the cecum alone.  I had anesthesia place a nasogastric tube, and confirmed its location in the stomach.  We reinspected the spleen, it was hemostatic, however, I did place a medium piece of Surgicel over it.  A Balfour retractor was removed.  A circular skin defect was made  in the left mid abdomen just above the level of the umbilicus lateral to the rectus with a Bovie electrocautery.  The subcutaneous tissue was divided with electrocautery.  The anterior fascia was split in a cruciate fashion.  The rectus muscle was spread with a Kelly, and the posterior fascia and peritoneum were incised in a cruciate fashion, I was able to pass 2 fingers through it.  We brought out the end proximal colon ensuring that it was not twisted.  We brought out about 3 inches through the abdominal wall.  There was excellent blood supply to it.  The last 1-2 cm, there was a little hyperemic, but I felt I had adequate length of colon with good blood supply.  Seprafilm was placed in the midline.  The patient really had no omentum.  The fascia was reapproximated with a #1 looped PDS from above and from below and 5 interrupted #1 Novafil sutures were interspersed in the midline as internal retention sutures.  At this point, we placed a blue towel over the midline incision.  I then amputated the end colostomy staple line for about 2 cm.  We then matured the colostomy with 3-0 Vicryl pops.  Several 3-0 Vicryl were used to [place] the colostomy and then we placed several interrupted 3-0 Vicryl in order to create a rosebud.  The ostomy was viable.  There was some green liquid stool within the colon.  An ostomy appliance was applied.  Wet-to-dry dressing was placed in the midline followed by ABD pads.  The patient was left intubated and taken to the ICU in stable condition.  All needle, sponge, and instrument counts were correct x2.  There were no immediate complications.     Leighton Ruff. Redmond Pulling, MD, FACS     EMW/MEDQ  D:  10/09/2013  T:  10/10/2013  Job:  [id number]  cc:   Tye [place], Dr. Rise Paganini, Dr. Satira Sark, MD

## 2013-10-10 NOTE — Procedures (Signed)
Central Venous Catheter Insertion Procedure Note SUHAYLA CHISOM [id number] July 17, 1935  Procedure: Insertion of Central Venous Catheter Indications: Assessment of intravascular volume, Drug and/or fluid administration and Frequent blood sampling  Procedure Details Consent: Risks of procedure as well as the alternatives and risks of each were explained to the (patient/caregiver).  Consent for procedure obtained. Time Out: Verified patient identification, verified procedure, site/side was marked, verified correct patient position, special equipment/implants available, medications/allergies/relevent history reviewed, required imaging and test results available.  Performed  Maximum sterile technique was used including antiseptics, cap, gloves, gown, hand hygiene, mask and sheet. Skin prep: Chlorhexidine; local anesthetic administered A antimicrobial bonded/coated triple lumen catheter was placed in the right internal jugular vein using the Seldinger technique.  Evaluation Blood flow good Complications: No apparent complications Patient did tolerate procedure well. Chest X-ray ordered to verify placement.  CXR: pending.  Keerat Denicola 10/10/2013, 9:54 AM

## 2013-10-10 NOTE — Progress Notes (Signed)
1 Day Post-Op  Subjective: Pt con't to wean on vent this AM.  No acute overnight events.  Objective: Vital signs in last 24 hours: Temp:  [97.7 F (36.5 C)-99.3 F (37.4 C)] 99.3 F (37.4 C) (07/20 0811) Pulse Rate:  [81-125] 121 (07/20 0820) Resp:  [9-24] 14 (07/20 0820) BP: (94-172)/(54-112) 104/64 mmHg (07/20 0820) SpO2:  [15 %-100 %] 96 % (07/20 0820) Arterial Line BP: (114-218)/(56-110) 120/61 mmHg (07/20 0800) FiO2 (%):  [40 %-60 %] 40 % (07/20 0820) Weight:  [139 lb 5.3 oz (63.2 kg)] 139 lb 5.3 oz (63.2 kg) (07/19 1704) Last BM Date: 10/02/13  Intake/Output from previous day: 07/19 0701 - 07/20 0700 In: 2780.6 [I.V.:2220.6; IV Piggyback:300] Out: 680 [Urine:430; Blood:250] Intake/Output this shift: Total I/O In: 221.2 [I.V.:111.2; Other:110] Out: 50 [Stool:50]  General appearance: sedated Cardio: regular rate and rhythm, S1, S2 normal, no murmur, click, rub or gallop GI: ostomy edematous as expected/pink, hypoactive BS, wound dressed and dry  Lab Results:   Recent Labs  10/09/13 1900 10/09/13 2208 10/10/13 0353  WBC 20.7*  --  20.6*  HGB 10.2* 10.2* 11.1*  HCT 35.1* 30.0* 37.2  PLT 265  --  269   BMET  Recent Labs  10/09/13 1900 10/09/13 2208 10/10/13 0353  NA 134* 131* 135*  K 4.2 3.7 4.4  CL 93*  --  93*  CO2 29  --  28  GLUCOSE 63*  --  114*  BUN 10  --  9  CREATININE 0.45*  --  0.42*  CALCIUM 8.0*  --  7.9*   PT/INR  Recent Labs  10/09/13 1900  LABPROT 17.1*  INR 1.39   ABG  Recent Labs  10/09/13 2208 10/10/13 0032  PHART 7.383 7.361  HCO3 30.9* 29.7*    Studies/Results: Dg Chest Portable 1 View  10/10/2013   CLINICAL DATA:  Patient on ventilator status post abdominal surgery  EXAM: PORTABLE CHEST - 1 VIEW  COMPARISON:  10/09/2013  FINDINGS: Endotracheal tube in unchanged position. NG tube again crosses the gastroesophageal junction and 2 lead pacer noted. Right internal jugular central line stable.  Elevated right diaphragm  with blunting of right costophrenic angle stable. This suggests right lower lobe consolidation and possibly a small effusion. On the left, there is mild hazy lower lobe opacity. This is minimally worse when compared to the prior study.  IMPRESSION: Minimally worse bilateral lower lobe opacities suggesting bibasilar consolidation possibly atelectasis. Tiny pleural effusions not excluded.   Electronically Signed   By: Skipper Cliche M.D.   On: 10/10/2013 08:01   Dg Chest Port 1 View  10/09/2013   CLINICAL DATA:  Endotracheal tube and central line placement.  EXAM: PORTABLE CHEST - 1 VIEW  COMPARISON:  Chest radiograph performed 09/27/2013  FINDINGS: The patient's endotracheal tube is seen ending 4-5 cm above the carina. A right IJ line is noted ending about the mid SVC. An enteric tube tip extends below the diaphragm.  Lungs expansion is mildly decreased. Mild bibasilar airspace opacities likely reflect atelectasis. No pleural effusion or pneumothorax is seen  The cardiomediastinal silhouette is borderline normal in size. A pacemaker is noted overlying the left chest wall, with leads ending overlying the right atrium and right ventricle. No acute osseous abnormalities are seen.  IMPRESSION: 1. Endotracheal tube seen ending 4-5 cm above the carina. 2. Right IJ line noted ending about the mid SVC. 3. Mildly hypoexpanded lungs; mild bibasilar airspace opacities likely reflect atelectasis.   Electronically Signed  By: Garald Balding M.D.   On: 10/09/2013 23:43    Anti-infectives: Anti-infectives   Start     Dose/Rate Route Frequency Ordered Stop   10/10/13 0600  piperacillin-tazobactam (ZOSYN) IVPB 3.375 g     3.375 g 12.5 mL/hr over 240 Minutes Intravenous Every 8 hours 10/09/13 2326     10/09/13 2330  cefoTEtan (CEFOTAN) 2 g in dextrose 5 % 50 mL IVPB    Comments:  Send medication to OR   2 g 100 mL/hr over 30 Minutes Intravenous On call to O.R. 10/09/13 2321 10/10/13 0100   10/09/13 2330   piperacillin-tazobactam (ZOSYN) IVPB 3.375 g     3.375 g 100 mL/hr over 30 Minutes Intravenous  Once 10/09/13 2326 10/10/13 0100   10/09/13 2115  cefoTEtan (CEFOTAN) 2 g in dextrose 5 % 50 mL IVPB  Status:  Discontinued     2 g 100 mL/hr over 30 Minutes Intravenous To Surgery 10/09/13 2105 10/09/13 2321   10/09/13 1930  piperacillin-tazobactam (ZOSYN) IVPB 3.375 g  Status:  Discontinued     3.375 g 12.5 mL/hr over 240 Minutes Intravenous 3 times per day 10/09/13 1923 10/09/13 1928      Assessment/Plan: s/p Procedure(s): EXPLORATORY LAPAROTOMY, LOCALIZATION OF SPLENIC FLEXTURE, HARTMANS PROCEDURE (N/A) Weaning vent as per CCM Con't NGT if extubated Wound mgmt  LOS: 1 day    Rosario Jacks., Anne Hahn 10/10/2013

## 2013-10-10 NOTE — Progress Notes (Signed)
PULMONARY / CRITICAL CARE MEDICINE HISTORY AND PHYSICAL EXAMINATION   Name: [name] MRN: [id number] DOB: 01-26-1936    ADMISSION DATE:  10/09/2013  PRIMARY SERVICE: PCCM  CHIEF COMPLAINT:  Abdominal Surgery  BRIEF PATIENT DESCRIPTION: 78 y/o woman with history of COPD, afib on coumadin, several weeks of severe constipation progressed to large bowel obstruction and taken to OR on 7/19 for ex lap and colostomy.  SIGNIFICANT EVENTS / STUDIES:  Ex Lap 10/09/13  LINES / TUBES: ETT 7/19>>> L radial A-line 7/19>>> CVC (R IJ) 7/19>>> NG 7/19>>> Foley 7/19>>>  CULTURES: No cultures obtained  ANTIBIOTICS: Cefotetan 7/19>>>7/20 Pip-tazo 7/19>>>  SUBJECTIVE: A-fib with RVR overnight.  Did not tolerate wean this AM.  VITAL SIGNS: Temp:  [97.7 F (36.5 C)-99.3 F (37.4 C)] 99.3 F (37.4 C) (07/20 0811) Pulse Rate:  [81-127] 127 (07/20 0900) Resp:  [9-30] 22 (07/20 0900) BP: (94-172)/(54-112) 127/82 mmHg (07/20 0900) SpO2:  [15 %-100 %] 95 % (07/20 0900) Arterial Line BP: (114-218)/(56-110) 154/76 mmHg (07/20 0900) FiO2 (%):  [40 %-60 %] 40 % (07/20 0855) Weight:  [139 lb 5.3 oz (63.2 kg)] 139 lb 5.3 oz (63.2 kg) (07/19 1704) HEMODYNAMICS: A-fib, but normo- to hypertensive   VENTILATOR SETTINGS: Vent Mode:  [-] PRVC FiO2 (%):  [40 %-60 %] 40 % Set Rate:  [10 bmp-21 bmp] 12 bmp Vt Set:  [350 mL-550 mL] 550 mL PEEP:  [5 cmH20] 5 cmH20 Pressure Support:  [10 cmH20] 10 cmH20 Plateau Pressure:  [14 cmH20-28 cmH20] 28 cmH20 INTAKE / OUTPUT: Intake/Output     07/19 0701 - 07/20 0700 07/20 0701 - 07/21 0700   I.V. (mL/kg) 2220.6 (35.1) 211.2 (3.3)   Other 260 110   IV Piggyback 300    Total Intake(mL/kg) 2780.6 (44) 321.2 (5.1)   Urine (mL/kg/hr) 430 75 (0.5)   Stool  50 (0.4)   Blood 250    Total Output 680 125   Net +2100.6 +196.2          PHYSICAL EXAMINATION: General:  Intubated and sedated, appears stated age. Neuro:  Sedated, does not follow  commands. HEENT:  ETT in place, NG in place Neck: No JVD evident Cardiovascular:  Tachycardic, irregular rate, no M/R/G Lungs:  Diminished breath sounds, no wheezes heard over the mechanics of the venitlator Abdomen:  Midline dressing was not removed, scant blood seen in ostomy bag.  LABS:  CBC  Recent Labs Lab 10/09/13 1900 10/09/13 2208 10/10/13 0353  WBC 20.7*  --  20.6*  HGB 10.2* 10.2* 11.1*  HCT 35.1* 30.0* 37.2  PLT 265  --  269   Coag's  Recent Labs Lab 10/09/13 1900  INR 1.39   BMET  Recent Labs Lab 10/09/13 1900 10/09/13 2208 10/10/13 0353  NA 134* 131* 135*  K 4.2 3.7 4.4  CL 93*  --  93*  CO2 29  --  28  BUN 10  --  9  CREATININE 0.45*  --  0.42*  GLUCOSE 63*  --  114*   Electrolytes  Recent Labs Lab 10/09/13 1900 10/10/13 0353  CALCIUM 8.0* 7.9*  MG  --  1.5  PHOS  --  2.4   Sepsis Markers  Recent Labs Lab 10/09/13 1900  LATICACIDVEN 0.7   ABG  Recent Labs Lab 10/09/13 2208 10/10/13 0032  PHART 7.383 7.361  PCO2ART 51.6* 52.3*  PO2ART 134.0* 68.0*   Liver Enzymes  Recent Labs Lab 10/09/13 1900 10/10/13 0353  AST 13 14  ALT 7 7  ALKPHOS 107 75  BILITOT 0.8 1.5*  ALBUMIN 2.1* 2.0*   Cardiac Enzymes No results found for this basename: TROPONINI, PROBNP,  in the last 168 hours Glucose No results found for this basename: GLUCAP,  in the last 168 hours  Imaging Dg Chest Portable 1 View  10/10/2013   CLINICAL DATA:  Patient on ventilator status post abdominal surgery  EXAM: PORTABLE CHEST - 1 VIEW  COMPARISON:  10/09/2013  FINDINGS: Endotracheal tube in unchanged position. NG tube again crosses the gastroesophageal junction and 2 lead pacer noted. Right internal jugular central line stable.  Elevated right diaphragm with blunting of right costophrenic angle stable. This suggests right lower lobe consolidation and possibly a small effusion. On the left, there is mild hazy lower lobe opacity. This is minimally worse when  compared to the prior study.  IMPRESSION: Minimally worse bilateral lower lobe opacities suggesting bibasilar consolidation possibly atelectasis. Tiny pleural effusions not excluded.   Electronically Signed   By: Skipper Cliche M.D.   On: 10/10/2013 08:01   Dg Chest Port 1 View  10/09/2013   CLINICAL DATA:  Endotracheal tube and central line placement.  EXAM: PORTABLE CHEST - 1 VIEW  COMPARISON:  Chest radiograph performed 09/27/2013  FINDINGS: The patient's endotracheal tube is seen ending 4-5 cm above the carina. A right IJ line is noted ending about the mid SVC. An enteric tube tip extends below the diaphragm.  Lungs expansion is mildly decreased. Mild bibasilar airspace opacities likely reflect atelectasis. No pleural effusion or pneumothorax is seen  The cardiomediastinal silhouette is borderline normal in size. A pacemaker is noted overlying the left chest wall, with leads ending overlying the right atrium and right ventricle. No acute osseous abnormalities are seen.  IMPRESSION: 1. Endotracheal tube seen ending 4-5 cm above the carina. 2. Right IJ line noted ending about the mid SVC. 3. Mildly hypoexpanded lungs; mild bibasilar airspace opacities likely reflect atelectasis.   Electronically Signed   By: Garald Balding M.D.   On: 10/09/2013 23:43    EKG: Afib CXR: ETT and IJ in good position, mild atelectasis. No infiltrates.  ASSESSMENT / PLAN:  Principal Problem:   Bowel obstruction Active Problems:   Essential hypertension, benign   Permanent atrial fibrillation   SICK SINUS SYNDROME   COPD   Chronic respiratory failure   PULMONARY A: Severe COPD Failure to Liberate from Mechanical Ventilation P:   - Continue as needed bronchodilators - Does not seem to have exacerbation at this time, so will hold off on giving steroids as these are likely to impair wound healing - When able to extubate patient, plan to transition to BiPAP - Wean sedation and plan to transition to PSV to move  toward liberation from vent.  CARDIOVASCULAR A: Atrial Fibrillation with RVR Hypertension History of Sick Sinus Syndrome P:   - Will replace double lumen with triple lumen today. - Will follow CVP. - Start dilt drip for rate control.  RENAL A: Mild Hyponatremia P:   - KVO IVF. - Diureses as ordered. - BMET in AM. - Replace electrolytes as indicated.  GASTROINTESTINAL A: S/p Ex Lap and colonic resection for bowel obstruction and microperforation S/p Colostomy P:   - Continue Zosyn, consider addition of vancomycin if febrile or WBC spikes. - F/u pathology.  HEMATOLOGIC A: Leukocytosis P:   - Likley due to stress of abdominal surgery, monitor for now.  INFECTIOUS A: Microperforation of large bowel P:   - S/p repair, agree with zoysn. Consider  addition of vancomycin if patient fails to improve. - D/C cefotetan.  ENDOCRINE A: History of osteoporosis P:   - No indication for acute management while critically ill.  NEUROLOGIC A: Sedation P:   - Will keep moderately sedated tonight for RASS goal of -1 to 0 and plan to lighten further as tolerated tomorrow.  TODAY'S SUMMARY: 78 y/o woman with large bowel obstruction, s/p resection and colostomy placement. Stricture of unclear etiology seems to be playing large role.  Keep on vent support for now, diurese then will SBT in AM.  I have personally obtained a history, examined the patient, evaluated laboratory and imaging results, formulated the assessment and plan and placed orders.  CRITICAL CARE: The patient is critically ill with multiple organ systems failure and requires high complexity decision making for assessment and support, frequent evaluation and titration of therapies, application of advanced monitoring technologies and extensive interpretation of multiple databases. Critical Care Time devoted to patient care services described in this note is 35 minutes.   Rush Farmer, M.D. [facility] Pulmonary/Critical  Care Medicine. Pager: [phone]. After hours pager: [phone].

## 2013-10-10 NOTE — Progress Notes (Signed)
Pt awake, follows commands, MOE x 4. RR 30's on CPAP/PS, BP and HR increased. Pt nods she is short of breath, RT called to place back on full vent support, awaiting CCM rounds.

## 2013-10-10 NOTE — H&P (Signed)
PULMONARY / CRITICAL CARE MEDICINE HISTORY AND PHYSICAL EXAMINATION   Name: [name]: [id number] DOB: [birth year]    ADMISSION DATE:  10/09/2013  PRIMARY SERVICE: PCCM  CHIEF COMPLAINT:  Abdominal Surgery  BRIEF PATIENT DESCRIPTION: 78 y/o woman with history of COPD, afib on coumadin, several weeks of severe constipation progressed to large bowel obstruction and taken to OR on 7/19 for ex lap and colostomy.  SIGNIFICANT EVENTS / STUDIES:  Ex Lap 10/09/13  LINES / TUBES: ETT A-line CVC (R IJ) NG Foley  CULTURES: No cultures obtained  ANTIBIOTICS: Cefotetan Pip-tazo  HISTORY OF PRESENT ILLNESS:   Patient is intubated, history obtained from family and electronic medical record. [name] is a 78 year old woman with a history of severe COPD on 2-3L of oxygen at home (FEV1 43%), AVN of R hip, sick sinus syndrome s/p pacemaker insertion who presents with severe constipation and abdominal pain. She had been seeing a gastroenterologist as she had no response to aggressive bowel regimen & enemas. She had not had a bowel movement in 14 days. Her coumadin was held and she underwent colonoscopy 06/09/13, but a stricture was encountered that could not be traversed, and she was transferred to [facility] for further management. The patient was taken to the operating room where a distal obstruction was identified in the large bowel, as well as evidence of micro perforation. The Splenic flexure was mobilized and ischemic appearing bowel was resected. She was taken back the ICU post-operatively.   PAST MEDICAL HISTORY :  Past Medical History  Diagnosis Date  . Osteoporosis   . Diverticulosis   . Renal cyst   . C. difficile colitis     12/06  . Lumbar and sacral arthritis   . COPD (chronic obstructive pulmonary disease)     PFT 10/05/08 - FEV1 0.89(43%), FVC 1.84(62%), FEV1% 49, TLC 5.64(120%), DLCO 38%  . Hypoxemia     Nocturnal oxygen  . Atrial fibrillation   . Sick sinus  syndrome     [employer] PPM 2004  . Pulmonary nodule   . Pneumonia due to Haemophilus influenzae 03/2011    Positive blood cultures   Past Surgical History  Procedure Laterality Date  . Total hip arthroplasty      Right  . Insert / replace / remove pacemaker      [employer]  . Abdominal hysterectomy    . Bronchoscopy with washings  03/2011    mucus in right lower lobe with obstruction and some bleeding.  chroinc bronchitis, lung collapse,  Brushings showed numerous acute inflammatory cells and degenerated materal .  . Esophagogastroduodenoscopy  05/23/2011    HER:DEYC distal esophageal erosions consistent with mild  reflux esophagitis/otherwise normal  . Colonoscopy  05/23/2011    XKG:YJEHUDJS colorectal polyps-treated/large rectal polyp likely corresponds to the area of abnormal PET activity  . Esophagogastroduodenoscopy  July 2014    Dr. Anthony Sar: retained food in stomach   Prior to Admission medications   Medication Sig Start Date End Date Taking? Authorizing Provider  albuterol (PROAIR HFA) 108 (90 BASE) MCG/ACT inhaler Inhale 2 puffs into the lungs every 6 (six) hours as needed for wheezing or shortness of breath.    Yes Historical Provider, MD  ALPRAZolam (XANAX) 0.25 MG tablet Take 0.25 mg by mouth 2 (two) times daily as needed for anxiety. For anxiety   Yes Historical Provider, MD  digoxin (LANOXIN) 0.125 MG tablet Take 1 tablet (0.125 mg total) by mouth daily. 02/25/13  Yes Champ Mungo  Lovena Le, MD  fluticasone-salmeterol (ADVAIR HFA) [phone] MCG/ACT inhaler Inhale 2 puffs into the lungs 2 (two) times daily.     Yes Historical Provider, MD  HYDROcodone-acetaminophen (NORCO/VICODIN) 5-325 MG per tablet Take 1 tablet by mouth every 4 (four) hours as needed for moderate pain.    Yes Historical Provider, MD  metoprolol tartrate (LOPRESSOR) 25 MG tablet Take 25 mg by mouth 2 (two) times daily.    Yes Historical Provider, MD  raloxifene (EVISTA) 60 MG tablet Take 60 mg by mouth every morning.     Yes Historical Provider, MD  tiotropium (SPIRIVA) 18 MCG inhalation capsule Place 18 mcg into inhaler and inhale at bedtime.    Yes Historical Provider, MD  verapamil (CALAN-SR) 120 MG CR tablet Take 1 tablet (120 mg total) by mouth 2 (two) times daily. 04/06/13  Yes Evans Lance, MD  warfarin (COUMADIN) 5 MG tablet Take 2.5-5 mg by mouth daily. As directed by coumadin clinic 5 mg on Mon-Wed-Fri-Sat 2.5 mg on all other days   Yes Historical Provider, MD  furosemide (LASIX) 40 MG tablet Take 40 mg by mouth daily as needed. Fluid retention    Historical Provider, MD  ipratropium-albuterol (DUONEB) 0.5-2.5 (3) MG/3ML SOLN Take 3 mLs by nebulization daily as needed (for shortness of breath/wheezing).    Historical Provider, MD  pantoprazole (PROTONIX) 40 MG tablet Take 40 mg by mouth 2 (two) times daily.     Historical Provider, MD  predniSONE (DELTASONE) 10 MG tablet Take 10 mg by mouth 2 (two) times daily with a meal.    Historical Provider, MD   Allergies  Allergen Reactions  . Tape Other (See Comments)    Paper tape only per family of the patient due to thinning of skin  . Cardizem [Diltiazem Hcl] Itching and Rash  . Sulfonamide Derivatives Rash    FAMILY HISTORY:  Family History  Problem Relation Age of Onset  . Lung cancer Mother     ???patient states she had cancer but not sure what type  . Heart disease Brother   . Colon cancer Neg Hx    SOCIAL HISTORY:  reports that she has quit smoking. Her smoking use included Cigarettes. She has a 20 pack-year smoking history. She has never used smokeless tobacco. She reports that she does not drink alcohol or use illicit drugs.  REVIEW OF SYSTEMS:  Cannot obtain 2/2 intubation  SUBJECTIVE: Patient intubated and sedated. Anxious family at bedside.  VITAL SIGNS: Temp:  [97.7 F (36.5 C)-98.5 F (36.9 C)] 97.7 F (36.5 C) (07/19 2330) Pulse Rate:  [81-122] 122 (07/20 0200) Resp:  [9-24] 21 (07/20 0200) BP: (94-172)/(57-112) 117/73 mmHg  (07/20 0200) SpO2:  [15 %-100 %] 99 % (07/19 2330) Arterial Line BP: (115-218)/(56-110) 129/62 mmHg (07/20 0200) FiO2 (%):  [40 %-60 %] 40 % (07/19 2333) Weight:  [139 lb 5.3 oz (63.2 kg)] 139 lb 5.3 oz (63.2 kg) (07/19 1704) HEMODYNAMICS: A-fib, but normo- to hypertensive   VENTILATOR SETTINGS: Vent Mode:  [-] PRVC FiO2 (%):  [40 %-60 %] 40 % Set Rate:  [10 bmp-21 bmp] 12 bmp Vt Set:  [350 mL-550 mL] 550 mL PEEP:  [5 cmH20] 5 cmH20 Plateau Pressure:  [14 cmH20] 14 cmH20 INTAKE / OUTPUT: Intake/Output     07/19 0701 - 07/20 0700   I.V. (mL/kg) 1597.8 (25.3)   IV Piggyback 250   Total Intake(mL/kg) 1847.8 (29.2)   Urine (mL/kg/hr) 430   Blood 250   Total Output 680  Net +1167.8         PHYSICAL EXAMINATION: General:  Intubated and sedated, appears stated age. Neuro:  Sedated, does not follow commands. HEENT:  ETT in place, NG in place Neck: No JVD evident Cardiovascular:  Tachycardic, irregular rate, no M/R/G Lungs:  Diminished breath sounds, no wheezes heard over the mechanics of the venitlator Abdomen:  Midline dressing was not removed, scant blood seen in ostomy bag.  LABS:  CBC  Recent Labs Lab 10/09/13 1900 10/09/13 2208  WBC 20.7*  --   HGB 10.2* 10.2*  HCT 35.1* 30.0*  PLT 265  --    Coag's  Recent Labs Lab 10/09/13 1900  INR 1.39   BMET  Recent Labs Lab 10/09/13 1900 10/09/13 2208  NA 134* 131*  K 4.2 3.7  CL 93*  --   CO2 29  --   BUN 10  --   CREATININE 0.45*  --   GLUCOSE 63*  --    Electrolytes  Recent Labs Lab 10/09/13 1900  CALCIUM 8.0*   Sepsis Markers  Recent Labs Lab 10/09/13 1900  LATICACIDVEN 0.7   ABG  Recent Labs Lab 10/09/13 2208 10/10/13 0032  PHART 7.383 7.361  PCO2ART 51.6* 52.3*  PO2ART 134.0* 68.0*   Liver Enzymes  Recent Labs Lab 10/09/13 1900  AST 13  ALT 7  ALKPHOS 107  BILITOT 0.8  ALBUMIN 2.1*   Cardiac Enzymes No results found for this basename: TROPONINI, PROBNP,  in the last  168 hours Glucose No results found for this basename: GLUCAP,  in the last 168 hours  Imaging Dg Chest Port 1 View  10/09/2013   CLINICAL DATA:  Endotracheal tube and central line placement.  EXAM: PORTABLE CHEST - 1 VIEW  COMPARISON:  Chest radiograph performed 09/27/2013  FINDINGS: The patient's endotracheal tube is seen ending 4-5 cm above the carina. A right IJ line is noted ending about the mid SVC. An enteric tube tip extends below the diaphragm.  Lungs expansion is mildly decreased. Mild bibasilar airspace opacities likely reflect atelectasis. No pleural effusion or pneumothorax is seen  The cardiomediastinal silhouette is borderline normal in size. A pacemaker is noted overlying the left chest wall, with leads ending overlying the right atrium and right ventricle. No acute osseous abnormalities are seen.  IMPRESSION: 1. Endotracheal tube seen ending 4-5 cm above the carina. 2. Right IJ line noted ending about the mid SVC. 3. Mildly hypoexpanded lungs; mild bibasilar airspace opacities likely reflect atelectasis.   Electronically Signed   By: Garald Balding M.D.   On: 10/09/2013 23:43    EKG: Afib CXR: ETT and IJ in good position, mild atelectasis. No infiltrates.  ASSESSMENT / PLAN:  Principal Problem:   Bowel obstruction Active Problems:   Essential hypertension, benign   Permanent atrial fibrillation   SICK SINUS SYNDROME   COPD   Chronic respiratory failure   PULMONARY A: Severe COPD Failure to Liberate from Mechanical Ventilation P:   - Continue as needed bronchodilators - Does not seem to have exacerbation at this time, so will hold off on giving steroids as these are likely to impair wound healing - When able to extubate patient, plan to transition to BiPAP - Wean sedation and plan to transition to PSV to move toward liberation from vent.  CARDIOVASCULAR A: Atrial Fibrillation Hypertension History of Sick Sinus Syndrome P:   Presently with acceptable heart rate  and blood pressure, may need beta-blockers if too hypertensive or too tachycardic. Continue to hold  anticoagulation until can discuss with surgery re-starting.  RENAL A: Mild Hyponatremia P:   Continue to trend, may be due to abdominal pain / nausea  GASTROINTESTINAL A: S/p Ex Lap and colonic resection for bowel obstruction and microperforation S/p Colostomy P:   Continue Zosyn, consider addition of vancomycin Defer to surgery for additional management. Keep patient NPO for now, may need TPN depending on return of bowel function. F/u pathology  HEMATOLOGIC A: Leukocytosis P:   Likley due to stress of abdominal stress.  INFECTIOUS A: Microperforation of large bowel P:   S/p repair, agree with zoysn. Consider addition of vancomycin if patient fails to improve.  ENDOCRINE A: History of osteoporosis P:   No indication for acute management while critically ill.  NEUROLOGIC A: Sedation P:   Will keep moderately sedated tonight for RASS goal of -1 to 0 and plan to lighten further as tolerated tomorrow.  BEST PRACTICE / DISPOSITION Level of Care:  ICU Primary Service:  PCCM Consultants:  General Surgery Code Status:  Full Code Diet:  NPO DVT Px:  Heparin + mechanical GI Px:  PPI Skin Integrity:  Intact Social / Family:  At bedside, updated  TODAY'S SUMMARY: 77 y/o woman with large bowel obstruction, s/p resection and colostomy placement. Stricture of unclear etiology seems to be playing large role.  I have personally obtained a history, examined the patient, evaluated laboratory and imaging results, formulated the assessment and plan and placed orders.  CRITICAL CARE: The patient is critically ill with multiple organ systems failure and requires high complexity decision making for assessment and support, frequent evaluation and titration of therapies, application of advanced monitoring technologies and extensive interpretation of multiple databases. Critical Care Time  devoted to patient care services described in this note is 60 minutes.   Luz Brazen, MD Pulmonary & Critical Care Medicine October 10, 2013, 3:54 AM

## 2013-10-11 ENCOUNTER — Inpatient Hospital Stay (HOSPITAL_COMMUNITY): Payer: Medicare Other

## 2013-10-11 ENCOUNTER — Encounter (HOSPITAL_COMMUNITY): Payer: Self-pay | Admitting: General Surgery

## 2013-10-11 LAB — CBC
HCT: 34.9 % — ABNORMAL LOW (ref 36.0–46.0)
HEMOGLOBIN: 10.9 g/dL — AB (ref 12.0–15.0)
MCH: 26.6 pg (ref 26.0–34.0)
MCHC: 31.2 g/dL (ref 30.0–36.0)
MCV: 85.1 fL (ref 78.0–100.0)
Platelets: 285 10*3/uL (ref 150–400)
RBC: 4.1 MIL/uL (ref 3.87–5.11)
RDW: 16.6 % — ABNORMAL HIGH (ref 11.5–15.5)
WBC: 19.6 10*3/uL — ABNORMAL HIGH (ref 4.0–10.5)

## 2013-10-11 LAB — GLUCOSE, CAPILLARY
GLUCOSE-CAPILLARY: 142 mg/dL — AB (ref 70–99)
GLUCOSE-CAPILLARY: 150 mg/dL — AB (ref 70–99)
GLUCOSE-CAPILLARY: 173 mg/dL — AB (ref 70–99)

## 2013-10-11 LAB — POCT I-STAT 3, ART BLOOD GAS (G3+)
ACID-BASE EXCESS: 9 mmol/L — AB (ref 0.0–2.0)
Bicarbonate: 33.3 mEq/L — ABNORMAL HIGH (ref 20.0–24.0)
O2 Saturation: 95 %
PH ART: 7.518 — AB (ref 7.350–7.450)
PO2 ART: 66 mmHg — AB (ref 80.0–100.0)
Patient temperature: 97.8
TCO2: 35 mmol/L (ref 0–100)
pCO2 arterial: 40.8 mmHg (ref 35.0–45.0)

## 2013-10-11 LAB — BASIC METABOLIC PANEL
Anion gap: 16 — ABNORMAL HIGH (ref 5–15)
BUN: 10 mg/dL (ref 6–23)
CALCIUM: 7.4 mg/dL — AB (ref 8.4–10.5)
CO2: 27 mEq/L (ref 19–32)
CREATININE: 0.67 mg/dL (ref 0.50–1.10)
Chloride: 91 mEq/L — ABNORMAL LOW (ref 96–112)
GFR calc Af Amer: >90 mL/min (ref >90)
GFR calc non Af Amer: 84 mL/min — ABNORMAL LOW (ref >90)
GLUCOSE: 149 mg/dL — AB (ref 70–99)
Potassium: 3.8 mEq/L (ref 3.7–5.3)
Sodium: 134 mEq/L — ABNORMAL LOW (ref 137–147)

## 2013-10-11 LAB — MAGNESIUM: MAGNESIUM: 1.7 mg/dL (ref 1.5–2.5)

## 2013-10-11 LAB — PHOSPHORUS: Phosphorus: 3.2 mg/dL (ref 2.3–4.6)

## 2013-10-11 MED ORDER — FUROSEMIDE 10 MG/ML IJ SOLN
40.0000 mg | Freq: Three times a day (TID) | INTRAMUSCULAR | Status: AC
  Administered 2013-10-11 (×2): 40 mg via INTRAVENOUS
  Filled 2013-10-11 (×2): qty 4

## 2013-10-11 NOTE — Clinical Documentation Improvement (Signed)
  Patient with [place] with surrounding edema now with "agitation" requiring restraints. Please add to documentation to reflect severity of illness and risk of mortality. Thank you.  Possible Clinical Conditions?  - Encephalopathy (describe type if known)                       Hypertensive                       Metabolic                       Toxic - Encephalopathy due to [place]  - Encephalopathy due to traumatic ICH - Dementia with behavioral disturbances  - Other Condition  Thank You, Ezekiel Ina ,RN Clinical Documentation Specialist:  [place] Information Management

## 2013-10-11 NOTE — Progress Notes (Signed)
I have seen and examined the pt and agree with PA-Osborne's progress note. WEaning vent per CCm Await bowel function, ostomy edematous as expected

## 2013-10-11 NOTE — Progress Notes (Signed)
[facility]  Patient Status: Active (receiving services up to time of hospitalization)  [facility] is providing the following services: RN and PT  If patient discharges after hours, please call [phone].   [name] 10/11/2013, 3:30 PM

## 2013-10-11 NOTE — Anesthesia Postprocedure Evaluation (Signed)
  Anesthesia Post-op Note  Patient: [name]  Procedure(s) Performed: Procedure(s): EXPLORATORY LAPAROTOMY, LOCALIZATION OF SPLENIC FLEXTURE, HARTMANS PROCEDURE (N/A)  Patient Location: SICU  Anesthesia Type:General  Level of Consciousness: sedated and Patient remains intubated per anesthesia plan  Airway and Oxygen Therapy: Patient remains intubated per anesthesia plan and Patient placed on Ventilator (see vital sign flow sheet for setting)  Post-op Pain: none  Post-op Assessment: Post-op Vital signs reviewed, PATIENT'S CARDIOVASCULAR STATUS UNSTABLE, RESPIRATORY FUNCTION UNSTABLE, Patent Airway and No signs of Nausea or vomiting  Post-op Vital Signs: stable  Last Vitals:  Filed Vitals:   10/11/13 0751  BP:   Pulse:   Temp: 36.2 C  Resp:     Complications: No apparent anesthesia complications

## 2013-10-11 NOTE — Progress Notes (Signed)
Patient ID: [name], female   DOB: [birth year], 78 y.o.   MRN: [id number] 2 Days Post-Op  Subjective: Patient on ventilator. She does open her eyes to her name.  Objective: Vital signs in last 24 hours: Temp:  [97.2 F (36.2 C)-99.5 F (37.5 C)] 97.2 F (36.2 C) (07/21 0751) Pulse Rate:  [63-127] 97 (07/21 0900) Resp:  [12-33] 33 (07/21 0900) BP: (94-143)/(46-88) 133/65 mmHg (07/21 0900) SpO2:  [94 %-100 %] 96 % (07/21 0900) Arterial Line BP: (102-153)/(57-78) 153/68 mmHg (07/21 0900) FiO2 (%):  [40 %] 40 % (07/21 0832) Last BM Date: 10/02/13  Intake/Output from previous day: 07/20 0701 - 07/21 0700 In: 1290 [I.V.:1100; NG/GT:30; IV Piggyback:50] Out: 2637 [Urine:1685; Stool:100] Intake/Output this shift: Total I/O In: 30 [NG/GT:30] Out: 60 [Urine:60]  PE: Abd: Soft, quite tender still but appropriate. Absent bowel sounds, midline wound is clean and packed. She has a left-sided colostomy. Her stoma is quite edematous but viable. She has no significant flatus or feculent output.  Lab Results:   Recent Labs  10/10/13 0353 10/11/13 0410  WBC 20.6* 19.6*  HGB 11.1* 10.9*  HCT 37.2 34.9*  PLT 269 285   BMET  Recent Labs  10/10/13 0353 10/11/13 0410  NA 135* 134*  K 4.4 3.8  CL 93* 91*  CO2 28 27  GLUCOSE 114* 149*  BUN 9 10  CREATININE 0.42* 0.67  CALCIUM 7.9* 7.4*   PT/INR  Recent Labs  10/09/13 1900  LABPROT 17.1*  INR 1.39   CMP     Component Value Date/Time   NA 134* 10/11/2013 0410   K 3.8 10/11/2013 0410   CL 91* 10/11/2013 0410   CO2 27 10/11/2013 0410   GLUCOSE 149* 10/11/2013 0410   BUN 10 10/11/2013 0410   CREATININE 0.67 10/11/2013 0410   CALCIUM 7.4* 10/11/2013 0410   PROT 4.7* 10/10/2013 0353   ALBUMIN 2.0* 10/10/2013 0353   AST 14 10/10/2013 0353   ALT 7 10/10/2013 0353   ALKPHOS 75 10/10/2013 0353   BILITOT 1.5* 10/10/2013 0353   GFRNONAA 84* 10/11/2013 0410   GFRAA >90 10/11/2013 0410   Lipase     Component Value Date/Time   LIPASE 9* 09/10/2012 0431       Studies/Results: Dg Chest Port 1 View  10/11/2013   CLINICAL DATA:  Intubation.  EXAM: PORTABLE CHEST - 1 VIEW  COMPARISON:  10/10/2013.  FINDINGS: Endotracheal tube, NG tube, right IJ line in stable position. Cardiac pacer with lead tips in the right atrium and right ventricle. Heart size is stable. Bibasilar atelectasis and/or infiltrates with small bilateral pleural effusions noted. No pneumothorax. No acute osseous abnormality.  IMPRESSION: 1. Line and tube position stable. 2. Persistent bibasilar atelectasis and/or infiltrates and bilateral small pleural effusions.   Electronically Signed   By: Marcello Moores  Register   On: 10/11/2013 07:42   Dg Chest Port 1 View  10/10/2013   CLINICAL DATA:  TLC placement  EXAM: PORTABLE CHEST - 1 VIEW  COMPARISON:  Earlier in the day at 23 AM.  FINDINGS: 10:04 a.m. Endotracheal tube 5.2 cm above carina. Pacer with leads at right atrium and right ventricle. No lead discontinuity. Nasogastric tube terminates at the body of the stomach. Right internal jugular line is unchanged with tip at low SVC. No new catheter identified.  Normal heart size. Mild right hemidiaphragm elevation. Probable small layering bilateral pleural effusions. No pneumothorax. Low lung volumes with patchy bibasilar airspace disease. Minimally increased.  IMPRESSION: Similar appearance of  support apparatus, without new catheter identified.  Small bilateral pleural effusions with slightly increased Airspace disease, likely atelectasis.   Electronically Signed   By: Abigail Miyamoto M.D.   On: 10/10/2013 10:13   Dg Chest Portable 1 View  10/10/2013   CLINICAL DATA:  Patient on ventilator status post abdominal surgery  EXAM: PORTABLE CHEST - 1 VIEW  COMPARISON:  10/09/2013  FINDINGS: Endotracheal tube in unchanged position. NG tube again crosses the gastroesophageal junction and 2 lead pacer noted. Right internal jugular central line stable.  Elevated right diaphragm with  blunting of right costophrenic angle stable. This suggests right lower lobe consolidation and possibly a small effusion. On the left, there is mild hazy lower lobe opacity. This is minimally worse when compared to the prior study.  IMPRESSION: Minimally worse bilateral lower lobe opacities suggesting bibasilar consolidation possibly atelectasis. Tiny pleural effusions not excluded.   Electronically Signed   By: Skipper Cliche M.D.   On: 10/10/2013 08:01   Dg Chest Port 1 View  10/09/2013   CLINICAL DATA:  Endotracheal tube and central line placement.  EXAM: PORTABLE CHEST - 1 VIEW  COMPARISON:  Chest radiograph performed 09/27/2013  FINDINGS: The patient's endotracheal tube is seen ending 4-5 cm above the carina. A right IJ line is noted ending about the mid SVC. An enteric tube tip extends below the diaphragm.  Lungs expansion is mildly decreased. Mild bibasilar airspace opacities likely reflect atelectasis. No pleural effusion or pneumothorax is seen  The cardiomediastinal silhouette is borderline normal in size. A pacemaker is noted overlying the left chest wall, with leads ending overlying the right atrium and right ventricle. No acute osseous abnormalities are seen.  IMPRESSION: 1. Endotracheal tube seen ending 4-5 cm above the carina. 2. Right IJ line noted ending about the mid SVC. 3. Mildly hypoexpanded lungs; mild bibasilar airspace opacities likely reflect atelectasis.   Electronically Signed   By: Garald Balding M.D.   On: 10/09/2013 23:43    Anti-infectives: Anti-infectives   Start     Dose/Rate Route Frequency Ordered Stop   10/10/13 0600  piperacillin-tazobactam (ZOSYN) IVPB 3.375 g     3.375 g 12.5 mL/hr over 240 Minutes Intravenous Every 8 hours 10/09/13 2326     10/09/13 2330  cefoTEtan (CEFOTAN) 2 g in dextrose 5 % 50 mL IVPB    Comments:  Send medication to OR   2 g 100 mL/hr over 30 Minutes Intravenous On call to O.R. 10/09/13 2321 10/10/13 0100   10/09/13 2330   piperacillin-tazobactam (ZOSYN) IVPB 3.375 g     3.375 g 100 mL/hr over 30 Minutes Intravenous  Once 10/09/13 2326 10/10/13 0100   10/09/13 2115  cefoTEtan (CEFOTAN) 2 g in dextrose 5 % 50 mL IVPB  Status:  Discontinued     2 g 100 mL/hr over 30 Minutes Intravenous To Surgery 10/09/13 2105 10/09/13 2321   10/09/13 1930  piperacillin-tazobactam (ZOSYN) IVPB 3.375 g  Status:  Discontinued     3.375 g 12.5 mL/hr over 240 Minutes Intravenous 3 times per day 10/09/13 1923 10/09/13 1928       Assessment/Plan  1. POD2, status post exploratory laparotomy with Hartman's procedure for colon obstruction 2. 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 COPD 4. ventilator dependent respiratory failure 5. Atrial fibrillation 6. Hypertension 7. Leukocytosis  Plan: 1. We'll continue NG tube suction and await bowel function. What she begins to have some bowel function, if she remains intubated, then we can start enteral  tube feeds. 2. Begin normal saline wet to dry dressing changes to her midline wound. 3. WOC consult for new ostomy. 4. Continue antibiotic therapy.    LOS: 2 days    Ashmi Blas E 10/11/2013, 9:42 AM Pager: [phone]

## 2013-10-11 NOTE — Progress Notes (Signed)
INITIAL NUTRITION ASSESSMENT  DOCUMENTATION CODES Per approved criteria  -Not Applicable   INTERVENTION: Recommend initiation of nutrition support (TPN vs EN) in next 24-48 hours if prolonged intubation expected RD to follow for nutrition care plan  NUTRITION DIAGNOSIS: Inadequate oral intake related to inability to eat as evidenced by NPO status  Goal: Pt to meet >/= 90% of their estimated nutrition needs   Monitor:  Nutrition support initaition & tolerance, respiratory status, weight, labs, I/O's  Reason for Assessment: VDRF  78 y.o. female  Admitting Dx: Bowel obstruction  ASSESSMENT: 77 y/o woman with history of COPD, afib on coumadin, several weeks of severe constipation which progressed to distal large bowel obstruction.   Patient s/p procedures 7/20: EXPLORATORY LAPAROTOMY HARTMANN PROCEDURE MOBILIZATION OF SPLENIC FLEXURE  Patient is currently intubated on ventilator support -- NGT to LIS MV: 9.7 L/min Temp (24hrs), Avg:98.4 F (36.9 C), Min:97.2 F (36.2 C), Max:98.8 F (37.1 C)   Propofol: 3.8 ml/hr -----> 100 fat kcals  CCM note reviewed.  To begin PS trials but no extubation until pulmonary mechanics improve.  No muscle or subcutaneous fat depletion noticed.  Height: Ht Readings from Last 1 Encounters:  10/09/13 5\' 5"  (1.651 m)    Weight: Wt Readings from Last 1 Encounters:  10/09/13 139 lb 5.3 oz (63.2 kg)    Ideal Body Weight: 125 lb  % Ideal Body Weight: 111%  Wt Readings from Last 10 Encounters:  10/09/13 139 lb 5.3 oz (63.2 kg)  10/09/13 139 lb 5.3 oz (63.2 kg)  06/13/13 131 lb (59.421 kg)  02/25/13 132 lb (59.875 kg)  02/09/13 131 lb 12 oz (59.761 kg)  11/09/12 126 lb 6.4 oz (57.335 kg)  09/15/12 127 lb 13.9 oz (58 kg)  07/26/12 127 lb (57.607 kg)  01/05/12 131 lb 6.4 oz (59.603 kg)  11/12/11 130 lb (58.968 kg)    Usual Body Weight: 131 lb -- March 2015  % Usual Body Weight: 106%  BMI:  Body mass index is 23.19  kg/(m^2).  Estimated Nutritional Needs: Kcal: 1350-1500 Protein: 100-110 gm Fluid: per MD  Skin: Stage II pressure ulcer to buttocks   Diet Order: NPO  EDUCATION NEEDS: -No education needs identified at this time   Intake/Output Summary (Last 24 hours) at 10/11/13 1322 Last data filed at 10/11/13 1200  Gross per 24 hour  Intake 917.63 ml  Output   2030 ml  Net -1112.37 ml    Labs:   Recent Labs Lab 10/09/13 1900 10/09/13 2208 10/10/13 0353 10/11/13 0410  NA 134* 131* 135* 134*  K 4.2 3.7 4.4 3.8  CL 93*  --  93* 91*  CO2 29  --  28 27  BUN 10  --  9 10  CREATININE 0.45*  --  0.42* 0.67  CALCIUM 8.0*  --  7.9* 7.4*  MG  --   --  1.5 1.7  PHOS  --   --  2.4 3.2  GLUCOSE 63*  --  114* 149*    CBG (last 3)   Recent Labs  10/11/13 0048 10/11/13 0336 10/11/13 0747  GLUCAP 142* 150* 173*    Scheduled Meds: . antiseptic oral rinse  15 mL Mouth Rinse BID  . chlorhexidine  15 mL Mouth/Throat BID  . digoxin  0.125 mg Intravenous Daily  . furosemide  40 mg Intravenous Q8H  . heparin subcutaneous  5,000 Units Subcutaneous 3 times per day  . metoprolol  2.5 mg Intravenous 4 times per day  . mometasone-formoterol  2 puff Inhalation BID  . pantoprazole (PROTONIX) IV  40 mg Intravenous Q24H  . piperacillin-tazobactam (ZOSYN)  IV  3.375 g Intravenous Q8H  . sodium chloride  10-40 mL Intracatheter Q12H    Continuous Infusions: . sodium chloride 10 mL/hr at 10/10/13 1010  . diltiazem (CARDIZEM) infusion 15 mg/hr (10/11/13 1200)  . propofol 10 mcg/kg/min (10/11/13 1200)    Past Medical History  Diagnosis Date  . Osteoporosis   . Diverticulosis   . Renal cyst   . C. difficile colitis     12/06  . Lumbar and sacral arthritis   . COPD (chronic obstructive pulmonary disease)     PFT 10/05/08 - FEV1 0.89(43%), FVC 1.84(62%), FEV1% 49, TLC 5.64(120%), DLCO 38%  . Hypoxemia     Nocturnal oxygen  . Atrial fibrillation   . Sick sinus syndrome     [employer] PPM  2004  . Pulmonary nodule   . Pneumonia due to Haemophilus influenzae 03/2011    Positive blood cultures    Past Surgical History  Procedure Laterality Date  . Total hip arthroplasty      Right  . Insert / replace / remove pacemaker      [employer]  . Abdominal hysterectomy    . Bronchoscopy with washings  03/2011    mucus in right lower lobe with obstruction and some bleeding.  chroinc bronchitis, lung collapse,  Brushings showed numerous acute inflammatory cells and degenerated materal .  . Esophagogastroduodenoscopy  05/23/2011    QMG:NOIB distal esophageal erosions consistent with mild  reflux esophagitis/otherwise normal  . Colonoscopy  05/23/2011    BCW:UGQBVQXI colorectal polyps-treated/large rectal polyp likely corresponds to the area of abnormal PET activity  . Esophagogastroduodenoscopy  July 2014    Dr. Anthony Sar: retained food in stomach  . Laparotomy N/A 10/09/2013    Procedure: EXPLORATORY LAPAROTOMY, LOCALIZATION OF SPLENIC FLEXTURE, HARTMANS PROCEDURE;  Surgeon: Gayland Curry, MD;  Location: [facility];  Service: General;  Laterality: N/A;    Arthur Holms, RD, LDN Pager #: [phone] After-Hours Pager #: [phone]

## 2013-10-11 NOTE — Consult Note (Signed)
WOC reviewed records, she was seen initially yesterday and she is not having output currently. She is on the vent currently. WOC will follow along and monitor status of ostomy and once pt off vent begin ostomy teaching with daughter and patient.   Khrystyna Schwalm [place] RN,CWOCN [phone]

## 2013-10-11 NOTE — Progress Notes (Signed)
PULMONARY / CRITICAL CARE MEDICINE HISTORY AND PHYSICAL EXAMINATION   Name: [name] MRN: [id number] DOB: 1935-06-30    ADMISSION DATE:  10/09/2013  PRIMARY SERVICE: PCCM  CHIEF COMPLAINT:  Abdominal Surgery  BRIEF PATIENT DESCRIPTION: 78 y/o woman with history of COPD, afib on coumadin, several weeks of severe constipation progressed to large bowel obstruction and taken to OR on 7/19 for ex lap and colostomy.  SIGNIFICANT EVENTS / STUDIES:  Ex Lap 10/09/13  LINES / TUBES: ETT 7/19>>> L radial A-line 7/19>>> CVC (R IJ) 7/19>>> NG 7/19>>> Foley 7/19>>>  CULTURES: No cultures obtained  ANTIBIOTICS: Cefotetan 7/19>>>7/20 Pip-tazo 7/19>>>  SUBJECTIVE: A-fib with RVR stable with Cardizem drip.  VITAL SIGNS: Temp:  [97.2 F (36.2 C)-99.5 F (37.5 C)] 97.2 F (36.2 C) (07/21 0751) Pulse Rate:  [63-131] 94 (07/21 0832) Resp:  [12-27] 27 (07/21 0832) BP: (94-143)/(46-88) 134/65 mmHg (07/21 0832) SpO2:  [94 %-100 %] 100 % (07/21 0832) Arterial Line BP: (102-154)/(57-78) 147/78 mmHg (07/21 0600) FiO2 (%):  [40 %] 40 % (07/21 0832) HEMODYNAMICS: A-fib, but normo- to hypertensive CVP:  [4 mmHg-8 mmHg] 7 mmHg VENTILATOR SETTINGS: Vent Mode:  [-] PSV;CPAP FiO2 (%):  [40 %] 40 % Set Rate:  [12 bmp] 12 bmp Vt Set:  [550 mL] 550 mL PEEP:  [5 cmH20] 5 cmH20 Pressure Support:  [8 cmH20] 8 cmH20 Plateau Pressure:  [14 cmH20-28 cmH20] 15 cmH20 INTAKE / OUTPUT: Intake/Output     07/20 0701 - 07/21 0700 07/21 0701 - 07/22 0700   I.V. (mL/kg) 1100 (17.4)    Other 110    NG/GT 30 30   IV Piggyback 50    Total Intake(mL/kg) 1290 (20.4) 30 (0.5)   Urine (mL/kg/hr) 1685 (1.1) 60 (0.5)   Stool 100 (0.1)    Blood     Total Output 1785 60   Net -495 -30          PHYSICAL EXAMINATION: General:  Intubated and sedated, appears stated age. Neuro:  Sedated, does not follow commands. HEENT:  ETT in place, NG in place Neck: No JVD evident Cardiovascular:  Tachycardic,  irregular rate, no M/R/G Lungs:  Diminished breath sounds, no wheezes heard over the mechanics of the venitlator Abdomen:  Midline dressing was not removed, scant blood seen in ostomy bag.  LABS:  CBC  Recent Labs Lab 10/09/13 1900 10/09/13 2208 10/10/13 0353 10/11/13 0410  WBC 20.7*  --  20.6* 19.6*  HGB 10.2* 10.2* 11.1* 10.9*  HCT 35.1* 30.0* 37.2 34.9*  PLT 265  --  269 285   Coag's  Recent Labs Lab 10/09/13 1900  INR 1.39   BMET  Recent Labs Lab 10/09/13 1900 10/09/13 2208 10/10/13 0353 10/11/13 0410  NA 134* 131* 135* 134*  K 4.2 3.7 4.4 3.8  CL 93*  --  93* 91*  CO2 29  --  28 27  BUN 10  --  9 10  CREATININE 0.45*  --  0.42* 0.67  GLUCOSE 63*  --  114* 149*   Electrolytes  Recent Labs Lab 10/09/13 1900 10/10/13 0353 10/11/13 0410  CALCIUM 8.0* 7.9* 7.4*  MG  --  1.5 1.7  PHOS  --  2.4 3.2   Sepsis Markers  Recent Labs Lab 10/09/13 1900  LATICACIDVEN 0.7   ABG  Recent Labs Lab 10/09/13 2208 10/10/13 0032 10/11/13 0416  PHART 7.383 7.361 7.518*  PCO2ART 51.6* 52.3* 40.8  PO2ART 134.0* 68.0* 66.0*   Liver Enzymes  Recent Labs Lab 10/09/13  1900 10/10/13 0353  AST 13 14  ALT 7 7  ALKPHOS 107 75  BILITOT 0.8 1.5*  ALBUMIN 2.1* 2.0*   Cardiac Enzymes No results found for this basename: TROPONINI, PROBNP,  in the last 168 hours Glucose  Recent Labs Lab 10/10/13 1957 10/11/13 0048 10/11/13 0336  GLUCAP 143* 142* 150*    Imaging Dg Chest Port 1 View  10/11/2013   CLINICAL DATA:  Intubation.  EXAM: PORTABLE CHEST - 1 VIEW  COMPARISON:  10/10/2013.  FINDINGS: Endotracheal tube, NG tube, right IJ line in stable position. Cardiac pacer with lead tips in the right atrium and right ventricle. Heart size is stable. Bibasilar atelectasis and/or infiltrates with small bilateral pleural effusions noted. No pneumothorax. No acute osseous abnormality.  IMPRESSION: 1. Line and tube position stable. 2. Persistent bibasilar atelectasis  and/or infiltrates and bilateral small pleural effusions.   Electronically Signed   By: Marcello Moores  Register   On: 10/11/2013 07:42   Dg Chest Port 1 View  10/10/2013   CLINICAL DATA:  TLC placement  EXAM: PORTABLE CHEST - 1 VIEW  COMPARISON:  Earlier in the day at 8 AM.  FINDINGS: 10:04 a.m. Endotracheal tube 5.2 cm above carina. Pacer with leads at right atrium and right ventricle. No lead discontinuity. Nasogastric tube terminates at the body of the stomach. Right internal jugular line is unchanged with tip at low SVC. No new catheter identified.  Normal heart size. Mild right hemidiaphragm elevation. Probable small layering bilateral pleural effusions. No pneumothorax. Low lung volumes with patchy bibasilar airspace disease. Minimally increased.  IMPRESSION: Similar appearance of support apparatus, without new catheter identified.  Small bilateral pleural effusions with slightly increased Airspace disease, likely atelectasis.   Electronically Signed   By: Abigail Miyamoto M.D.   On: 10/10/2013 10:13   Dg Chest Portable 1 View  10/10/2013   CLINICAL DATA:  Patient on ventilator status post abdominal surgery  EXAM: PORTABLE CHEST - 1 VIEW  COMPARISON:  10/09/2013  FINDINGS: Endotracheal tube in unchanged position. NG tube again crosses the gastroesophageal junction and 2 lead pacer noted. Right internal jugular central line stable.  Elevated right diaphragm with blunting of right costophrenic angle stable. This suggests right lower lobe consolidation and possibly a small effusion. On the left, there is mild hazy lower lobe opacity. This is minimally worse when compared to the prior study.  IMPRESSION: Minimally worse bilateral lower lobe opacities suggesting bibasilar consolidation possibly atelectasis. Tiny pleural effusions not excluded.   Electronically Signed   By: Skipper Cliche M.D.   On: 10/10/2013 08:01   Dg Chest Port 1 View  10/09/2013   CLINICAL DATA:  Endotracheal tube and central line placement.   EXAM: PORTABLE CHEST - 1 VIEW  COMPARISON:  Chest radiograph performed 09/27/2013  FINDINGS: The patient's endotracheal tube is seen ending 4-5 cm above the carina. A right IJ line is noted ending about the mid SVC. An enteric tube tip extends below the diaphragm.  Lungs expansion is mildly decreased. Mild bibasilar airspace opacities likely reflect atelectasis. No pleural effusion or pneumothorax is seen  The cardiomediastinal silhouette is borderline normal in size. A pacemaker is noted overlying the left chest wall, with leads ending overlying the right atrium and right ventricle. No acute osseous abnormalities are seen.  IMPRESSION: 1. Endotracheal tube seen ending 4-5 cm above the carina. 2. Right IJ line noted ending about the mid SVC. 3. Mildly hypoexpanded lungs; mild bibasilar airspace opacities likely reflect atelectasis.   Electronically Signed  By: Garald Balding M.D.   On: 10/09/2013 23:43   EKG: Afib CXR: ETT and IJ in good position, mild atelectasis. No infiltrates.  ASSESSMENT / PLAN:  Principal Problem:   Bowel obstruction Active Problems:   Essential hypertension, benign   Permanent atrial fibrillation   SICK SINUS SYNDROME   COPD   Chronic respiratory failure   Acute respiratory failure   PULMONARY A: Severe COPD Failure to Liberate from Mechanical Ventilation P:   - Continue as needed bronchodilators - Does not seem to have exacerbation at this time, so will hold off on giving steroids as these are likely to impair wound healing - Begin PS trials but no extubation until pulmonary mechanics improve. - Diureses today.  CARDIOVASCULAR A: Atrial Fibrillation with RVR Hypertension History of Sick Sinus Syndrome P:   - Will follow CVP. - Continue dilt drip for rate control until able to use PO  RENAL A: Mild Hyponatremia P:   - KVO IVF. - Diureses as ordered. - BMET in AM. - Replace electrolytes as indicated.  GASTROINTESTINAL A: S/p Ex Lap and colonic  resection for bowel obstruction and microperforation S/p Colostomy P:   - Continue Zosyn, consider addition of vancomycin if febrile or WBC spikes. - F/u pathology. - Would like to start TF but will await input from surgery on when will be able to use GI tract.  HEMATOLOGIC A: Leukocytosis P:   - Likley due to stress of abdominal surgery, monitor for now.  INFECTIOUS A: Microperforation of large bowel P:   - S/p repair, agree with zoysn. Consider addition of vancomycin if patient fails to improve but white count is improving. - D/Ced cefotetan.  ENDOCRINE A: History of osteoporosis P:   - No indication for acute management while critically ill.  NEUROLOGIC A: Sedation P:   - Lighten sedation for weaning trials today.  TODAY'S SUMMARY: 78 y/o woman with large bowel obstruction, s/p resection and colostomy placement. Stricture of unclear etiology seems to be playing large role.  Begin PS trials, will diurese more today with the hope to extubate in AM.  I have personally obtained a history, examined the patient, evaluated laboratory and imaging results, formulated the assessment and plan and placed orders.  CRITICAL CARE: The patient is critically ill with multiple organ systems failure and requires high complexity decision making for assessment and support, frequent evaluation and titration of therapies, application of advanced monitoring technologies and extensive interpretation of multiple databases. Critical Care Time devoted to patient care services described in this note is 35 minutes.   Rush Farmer, M.D. [facility] Pulmonary/Critical Care Medicine. Pager: [phone]. After hours pager: [phone].

## 2013-10-12 ENCOUNTER — Inpatient Hospital Stay (HOSPITAL_COMMUNITY): Payer: Medicare Other

## 2013-10-12 DIAGNOSIS — J9610 Chronic respiratory failure, unspecified whether with hypoxia or hypercapnia: Secondary | ICD-10-CM

## 2013-10-12 LAB — POCT I-STAT 3, ART BLOOD GAS (G3+)
Acid-Base Excess: 17 mmol/L — ABNORMAL HIGH (ref 0.0–2.0)
Acid-Base Excess: 14 mmol/L — ABNORMAL HIGH (ref 0.0–2.0)
BICARBONATE: 40.2 meq/L — AB (ref 20.0–24.0)
Bicarbonate: 39.5 mEq/L — ABNORMAL HIGH (ref 20.0–24.0)
O2 Saturation: 97 %
O2 Saturation: 87 %
PCO2 ART: 42.4 mmHg (ref 35.0–45.0)
PCO2 ART: 51.8 mmHg — AB (ref 35.0–45.0)
PH ART: 7.584 — AB (ref 7.350–7.450)
PH ART: 7.487 — AB (ref 7.350–7.450)
PO2 ART: 48 mmHg — AB (ref 80.0–100.0)
Patient temperature: 98
Patient temperature: 97
TCO2: 42 mmol/L (ref 0–100)
TCO2: 41 mmol/L (ref 0–100)
pO2, Arterial: 76 mmHg — ABNORMAL LOW (ref 80.0–100.0)

## 2013-10-12 LAB — GLUCOSE, CAPILLARY
GLUCOSE-CAPILLARY: 111 mg/dL — AB (ref 70–99)
GLUCOSE-CAPILLARY: 125 mg/dL — AB (ref 70–99)
GLUCOSE-CAPILLARY: 75 mg/dL (ref 70–99)
Glucose-Capillary: 134 mg/dL — ABNORMAL HIGH (ref 70–99)

## 2013-10-12 LAB — CBC
HEMATOCRIT: 31.2 % — AB (ref 36.0–46.0)
Hemoglobin: 9.8 g/dL — ABNORMAL LOW (ref 12.0–15.0)
MCH: 25.9 pg — ABNORMAL LOW (ref 26.0–34.0)
MCHC: 31.4 g/dL (ref 30.0–36.0)
MCV: 82.5 fL (ref 78.0–100.0)
Platelets: 266 10*3/uL (ref 150–400)
RBC: 3.78 MIL/uL — ABNORMAL LOW (ref 3.87–5.11)
RDW: 16.6 % — AB (ref 11.5–15.5)
WBC: 13.8 10*3/uL — AB (ref 4.0–10.5)

## 2013-10-12 LAB — PHOSPHORUS: Phosphorus: 2.6 mg/dL (ref 2.3–4.6)

## 2013-10-12 LAB — BASIC METABOLIC PANEL
Anion gap: 12 (ref 5–15)
BUN: 11 mg/dL (ref 6–23)
CO2: 34 meq/L — AB (ref 19–32)
CREATININE: 0.58 mg/dL (ref 0.50–1.10)
Calcium: 7.6 mg/dL — ABNORMAL LOW (ref 8.4–10.5)
Chloride: 88 mEq/L — ABNORMAL LOW (ref 96–112)
GFR calc non Af Amer: 88 mL/min — ABNORMAL LOW (ref >90)
Glucose, Bld: 112 mg/dL — ABNORMAL HIGH (ref 70–99)
Potassium: 3.2 mEq/L — ABNORMAL LOW (ref 3.7–5.3)
Sodium: 134 mEq/L — ABNORMAL LOW (ref 137–147)

## 2013-10-12 LAB — MAGNESIUM: Magnesium: 1.6 mg/dL (ref 1.5–2.5)

## 2013-10-12 MED ORDER — INSULIN ASPART 100 UNIT/ML ~~LOC~~ SOLN
2.0000 [IU] | SUBCUTANEOUS | Status: DC
  Administered 2013-10-12 – 2013-10-15 (×4): 2 [IU] via SUBCUTANEOUS

## 2013-10-12 MED ORDER — MAGNESIUM SULFATE 40 MG/ML IJ SOLN
2.0000 g | Freq: Once | INTRAMUSCULAR | Status: AC
  Administered 2013-10-12: 2 g via INTRAVENOUS
  Filled 2013-10-12: qty 50

## 2013-10-12 MED ORDER — FUROSEMIDE 10 MG/ML IJ SOLN
40.0000 mg | Freq: Four times a day (QID) | INTRAMUSCULAR | Status: AC
  Administered 2013-10-12 (×3): 40 mg via INTRAVENOUS
  Filled 2013-10-12 (×3): qty 4

## 2013-10-12 MED ORDER — POTASSIUM CHLORIDE 10 MEQ/50ML IV SOLN
10.0000 meq | INTRAVENOUS | Status: AC
  Administered 2013-10-12 (×2): 10 meq via INTRAVENOUS
  Filled 2013-10-12 (×2): qty 50

## 2013-10-12 MED ORDER — FENTANYL CITRATE 0.05 MG/ML IJ SOLN
25.0000 ug | INTRAMUSCULAR | Status: DC | PRN
  Administered 2013-10-12 – 2013-10-13 (×9): 100 ug via INTRAVENOUS
  Filled 2013-10-12 (×10): qty 2

## 2013-10-12 MED ORDER — POTASSIUM PHOSPHATES 15 MMOLE/5ML IV SOLN
30.0000 mmol | Freq: Once | INTRAVENOUS | Status: AC
  Administered 2013-10-12: 30 mmol via INTRAVENOUS
  Filled 2013-10-12: qty 10

## 2013-10-12 NOTE — Progress Notes (Signed)
I have seen and examined the pt and agree with PA-Osborne's progress note. Ostomy pink and edmatous abd distended con't NGT Await TFs for now

## 2013-10-12 NOTE — Progress Notes (Signed)
Patient ID: [name], female   DOB: February 02, 1936, 78 y.o.   MRN: [id number] 3 Days Post-Op  Subjective: Patient intubated but awake. Complains of some abdominal pain.  Objective: Vital signs in last 24 hours: Temp:  [97 F (36.1 C)-98.6 F (37 C)] 97 F (36.1 C) (07/22 0800) Pulse Rate:  [65-128] 66 (07/22 0800) Resp:  [12-33] 13 (07/22 0800) BP: (111-150)/(43-91) 113/59 mmHg (07/22 0800) SpO2:  [95 %-99 %] 99 % (07/22 0800) Arterial Line BP: (109-156)/(43-68) 136/52 mmHg (07/22 0800) FiO2 (%):  [40 %] 40 % (07/22 0841) Weight:  [131 lb 9.8 oz (59.7 kg)] 131 lb 9.8 oz (59.7 kg) (07/22 0300) Last BM Date: 10/02/13  Intake/Output from previous day: 07/21 0701 - 07/22 0700 In: 903.6 [I.V.:613.6; NG/GT:90; IV Piggyback:200] Out: 2270 [Urine:2220; Emesis/NG output:50] Intake/Output this shift:    PE: Abd: Soft, wound is clean. The wound had to be finger fractured back open and packing were placed deeper. Stoma is still quite edematous with minimal output except for just some sweat.  Her upper abdomen seems quite distended and tender. Her NG tube has no output. Her NG tube did flush fairly easily with water. I'm concerned that her wall mount is not working. No active bowel sounds  Lab Results:   Recent Labs  10/11/13 0410 10/12/13 0403  WBC 19.6* 13.8*  HGB 10.9* 9.8*  HCT 34.9* 31.2*  PLT 285 266   BMET  Recent Labs  10/11/13 0410 10/12/13 0403  NA 134* 134*  K 3.8 3.2*  CL 91* 88*  CO2 27 34*  GLUCOSE 149* 112*  BUN 10 11  CREATININE 0.67 0.58  CALCIUM 7.4* 7.6*   PT/INR  Recent Labs  10/09/13 1900  LABPROT 17.1*  INR 1.39   CMP     Component Value Date/Time   NA 134* 10/12/2013 0403   K 3.2* 10/12/2013 0403   CL 88* 10/12/2013 0403   CO2 34* 10/12/2013 0403   GLUCOSE 112* 10/12/2013 0403   BUN 11 10/12/2013 0403   CREATININE 0.58 10/12/2013 0403   CALCIUM 7.6* 10/12/2013 0403   PROT 4.7* 10/10/2013 0353   ALBUMIN 2.0* 10/10/2013 0353   AST 14  10/10/2013 0353   ALT 7 10/10/2013 0353   ALKPHOS 75 10/10/2013 0353   BILITOT 1.5* 10/10/2013 0353   GFRNONAA 88* 10/12/2013 0403   GFRAA >90 10/12/2013 0403   Lipase     Component Value Date/Time   LIPASE 9* 09/10/2012 0431       Studies/Results: Dg Chest Port 1 View  10/12/2013   CLINICAL DATA:  Status post hip arthroplasty  EXAM: PORTABLE CHEST - 1 VIEW  COMPARISON:  Portable chest x-ray of October 11, 2013  FINDINGS: The lungs are slightly better inflated today. The pulmonary interstitium has improved. There remain small bilateral pleural effusions and there is left lower lobe atelectasis. The cardiac silhouette is top-normal in size. The pulmonary vascularity is normal.  The endotracheal tube tip lies 5.2 cm above the crotch of the carina. The esophagogastric tube tip and proximal port lie below the GE junction. The right internal jugular venous catheter tip lies in the proximal SVC. The permanent pacemaker is in appropriate position.  IMPRESSION: There has been interval improvement in the appearance of the lungs consistent with resolving interstitial edema. There remains left lower lobe atelectasis and small bilateral pleural effusions. The support tubes and lines are in appropriate position.   Electronically Signed   By: David  Martinique   On: 10/12/2013  08:04   Dg Chest Port 1 View  10/11/2013   CLINICAL DATA:  Intubation.  EXAM: PORTABLE CHEST - 1 VIEW  COMPARISON:  10/10/2013.  FINDINGS: Endotracheal tube, NG tube, right IJ line in stable position. Cardiac pacer with lead tips in the right atrium and right ventricle. Heart size is stable. Bibasilar atelectasis and/or infiltrates with small bilateral pleural effusions noted. No pneumothorax. No acute osseous abnormality.  IMPRESSION: 1. Line and tube position stable. 2. Persistent bibasilar atelectasis and/or infiltrates and bilateral small pleural effusions.   Electronically Signed   By: Marcello Moores  Register   On: 10/11/2013 07:42   Dg Chest Port 1  View  10/10/2013   CLINICAL DATA:  TLC placement  EXAM: PORTABLE CHEST - 1 VIEW  COMPARISON:  Earlier in the day at 38 AM.  FINDINGS: 10:04 a.m. Endotracheal tube 5.2 cm above carina. Pacer with leads at right atrium and right ventricle. No lead discontinuity. Nasogastric tube terminates at the body of the stomach. Right internal jugular line is unchanged with tip at low SVC. No new catheter identified.  Normal heart size. Mild right hemidiaphragm elevation. Probable small layering bilateral pleural effusions. No pneumothorax. Low lung volumes with patchy bibasilar airspace disease. Minimally increased.  IMPRESSION: Similar appearance of support apparatus, without new catheter identified.  Small bilateral pleural effusions with slightly increased Airspace disease, likely atelectasis.   Electronically Signed   By: Abigail Miyamoto M.D.   On: 10/10/2013 10:13    Anti-infectives: Anti-infectives   Start     Dose/Rate Route Frequency Ordered Stop   10/10/13 0600  piperacillin-tazobactam (ZOSYN) IVPB 3.375 g     3.375 g 12.5 mL/hr over 240 Minutes Intravenous Every 8 hours 10/09/13 2326     10/09/13 2330  cefoTEtan (CEFOTAN) 2 g in dextrose 5 % 50 mL IVPB    Comments:  Send medication to OR   2 g 100 mL/hr over 30 Minutes Intravenous On call to O.R. 10/09/13 2321 10/10/13 0100   10/09/13 2330  piperacillin-tazobactam (ZOSYN) IVPB 3.375 g     3.375 g 100 mL/hr over 30 Minutes Intravenous  Once 10/09/13 2326 10/10/13 0100   10/09/13 2115  cefoTEtan (CEFOTAN) 2 g in dextrose 5 % 50 mL IVPB  Status:  Discontinued     2 g 100 mL/hr over 30 Minutes Intravenous To Surgery 10/09/13 2105 10/09/13 2321   10/09/13 1930  piperacillin-tazobactam (ZOSYN) IVPB 3.375 g  Status:  Discontinued     3.375 g 12.5 mL/hr over 240 Minutes Intravenous 3 times per day 10/09/13 1923 10/09/13 1928       Assessment/Plan   1. POD3, status post exploratory laparotomy with Hartman's procedure for colon obstruction  2.  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 COPD  4. ventilator dependent respiratory failure  5. Atrial fibrillation  6. Hypertension  7. Leukocytosis, improving  Plan: 1. We have talked to the nurse about switching her wall mount out to see if we can get her NG tube working better and hopefully decompress her stomach some. It is possible to think that this distention may be from her colon overlying her stomach as well. Because of this as well as minimal bowel sounds, we would not recommend initiation of enteral tube feeds at this time. 2. Continue antibiotic therapy. 3. Routine ostomy care. 4. Continue dressing changes to midline abdominal wound. 5. Defer ventilator management and other medical care to CCM.   LOS: 3 days    Alzada Brazee E 10/12/2013, 8:55  AM Pager: [phone]

## 2013-10-12 NOTE — Progress Notes (Signed)
PULMONARY / CRITICAL CARE MEDICINE HISTORY AND PHYSICAL EXAMINATION   Name: [name] MRN: [id number] DOB: [birth year]    ADMISSION DATE:  10/09/2013  PRIMARY SERVICE: PCCM  CHIEF COMPLAINT:  Abdominal Surgery  BRIEF PATIENT DESCRIPTION: 78 y/o woman with history of COPD, afib on coumadin, several weeks of severe constipation progressed to large bowel obstruction and taken to OR on 7/19 for ex lap and colostomy.  SIGNIFICANT EVENTS / STUDIES:  Ex Lap 10/09/13  LINES / TUBES: ETT 7/19>>>7/22 L radial A-line 7/19>>> CVC (R IJ) 7/19>>> NG 7/19>>> Foley 7/19>>>  CULTURES: No cultures obtained  ANTIBIOTICS: Cefotetan 7/19>>>7/20 Pip-tazo 7/19>>>  SUBJECTIVE: A-fib with RVR stable with Cardizem drip.  VITAL SIGNS: Temp:  [97 F (36.1 C)-98.6 F (37 C)] 97 F (36.1 C) (07/22 0800) Pulse Rate:  [65-108] 94 (07/22 0900) Resp:  [12-28] 23 (07/22 0900) BP: (111-150)/(43-91) 134/56 mmHg (07/22 0900) SpO2:  [96 %-99 %] 96 % (07/22 0900) Arterial Line BP: (109-156)/(43-68) 135/54 mmHg (07/22 0900) FiO2 (%):  [40 %] 40 % (07/22 0900) Weight:  [131 lb 9.8 oz (59.7 kg)] 131 lb 9.8 oz (59.7 kg) (07/22 0300) HEMODYNAMICS: A-fib, but normo- to hypertensive CVP:  [5 mmHg-8 mmHg] 5 mmHg VENTILATOR SETTINGS: Vent Mode:  [-] PSV;CPAP FiO2 (%):  [40 %] 40 % Set Rate:  [12 bmp] 12 bmp Vt Set:  [550 mL] 550 mL PEEP:  [5 cmH20] 5 cmH20 Pressure Support:  [8 cmH20] 8 cmH20 Plateau Pressure:  [13 cmH20-18 cmH20] 13 cmH20 INTAKE / OUTPUT: Intake/Output     07/21 0701 - 07/22 0700 07/22 0701 - 07/23 0700   I.V. (mL/kg) 613.6 (10.3)    Other     NG/GT 90    IV Piggyback 200 50   Total Intake(mL/kg) 903.6 (15.1) 50 (0.8)   Urine (mL/kg/hr) 2220 (1.5)    Emesis/NG output 50 (0)    Stool     Total Output 2270     Net -1366.4 +50          PHYSICAL EXAMINATION: General:  Intubated and interactive, appears stated age. Neuro: Interacting and following commands. HEENT:  ETT in  place, NG in place Neck: No JVD evident Cardiovascular:  Tachycardic, irregular rate, no M/R/G Lungs:  Diminished breath sounds, no wheezes heard over the mechanics of the venitlator Abdomen:  Midline dressing was not removed, scant blood seen in ostomy bag.  LABS:  CBC  Recent Labs Lab 10/10/13 0353 10/11/13 0410 10/12/13 0403  WBC 20.6* 19.6* 13.8*  HGB 11.1* 10.9* 9.8*  HCT 37.2 34.9* 31.2*  PLT 269 285 266   Coag's  Recent Labs Lab 10/09/13 1900  INR 1.39   BMET  Recent Labs Lab 10/10/13 0353 10/11/13 0410 10/12/13 0403  NA 135* 134* 134*  K 4.4 3.8 3.2*  CL 93* 91* 88*  CO2 28 27 34*  BUN 9 10 11   CREATININE 0.42* 0.67 0.58  GLUCOSE 114* 149* 112*   Electrolytes  Recent Labs Lab 10/10/13 0353 10/11/13 0410 10/12/13 0403  CALCIUM 7.9* 7.4* 7.6*  MG 1.5 1.7 1.6  PHOS 2.4 3.2 2.6   Sepsis Markers  Recent Labs Lab 10/09/13 1900  LATICACIDVEN 0.7   ABG  Recent Labs Lab 10/10/13 0032 10/11/13 0416 10/12/13 0409  PHART 7.361 7.518* 7.584*  PCO2ART 52.3* 40.8 42.4  PO2ART 68.0* 66.0* 76.0*   Liver Enzymes  Recent Labs Lab 10/09/13 1900 10/10/13 0353  AST 13 14  ALT 7 7  ALKPHOS 107 75  BILITOT 0.8  1.5*  ALBUMIN 2.1* 2.0*   Cardiac Enzymes No results found for this basename: TROPONINI, PROBNP,  in the last 168 hours Glucose  Recent Labs Lab 10/10/13 1957 10/11/13 0048 10/11/13 0336 10/11/13 0747  GLUCAP 143* 142* 150* 173*    Imaging Dg Chest Port 1 View  10/12/2013   CLINICAL DATA:  Status post hip arthroplasty  EXAM: PORTABLE CHEST - 1 VIEW  COMPARISON:  Portable chest x-ray of October 11, 2013  FINDINGS: The lungs are slightly better inflated today. The pulmonary interstitium has improved. There remain small bilateral pleural effusions and there is left lower lobe atelectasis. The cardiac silhouette is top-normal in size. The pulmonary vascularity is normal.  The endotracheal tube tip lies 5.2 cm above the crotch of the  carina. The esophagogastric tube tip and proximal port lie below the GE junction. The right internal jugular venous catheter tip lies in the proximal SVC. The permanent pacemaker is in appropriate position.  IMPRESSION: There has been interval improvement in the appearance of the lungs consistent with resolving interstitial edema. There remains left lower lobe atelectasis and small bilateral pleural effusions. The support tubes and lines are in appropriate position.   Electronically Signed   By: David  Martinique   On: 10/12/2013 08:04   Dg Chest Port 1 View  10/11/2013   CLINICAL DATA:  Intubation.  EXAM: PORTABLE CHEST - 1 VIEW  COMPARISON:  10/10/2013.  FINDINGS: Endotracheal tube, NG tube, right IJ line in stable position. Cardiac pacer with lead tips in the right atrium and right ventricle. Heart size is stable. Bibasilar atelectasis and/or infiltrates with small bilateral pleural effusions noted. No pneumothorax. No acute osseous abnormality.  IMPRESSION: 1. Line and tube position stable. 2. Persistent bibasilar atelectasis and/or infiltrates and bilateral small pleural effusions.   Electronically Signed   By: Marcello Moores  Register   On: 10/11/2013 07:42   EKG: Afib CXR: ETT and IJ in good position, mild atelectasis. No infiltrates.  ASSESSMENT / PLAN:  Principal Problem:   Bowel obstruction Active Problems:   Essential hypertension, benign   Permanent atrial fibrillation   SICK SINUS SYNDROME   COPD   Chronic respiratory failure   Acute respiratory failure   PULMONARY A: Severe COPD Failure to Liberate from Mechanical Ventilation P:   - Extubate today. - Titrate O2 for sat of 88-92%. - IS and flutter valve. - Does not seem to have exacerbation at this time, so will hold off on giving steroids as these are likely to impair wound healing - Diureses as ordered.  CARDIOVASCULAR A: Atrial Fibrillation with RVR Hypertension History of Sick Sinus Syndrome P:   - Follow CVP. - Continue  dilt drip for rate control until able to use PO.  RENAL A: Mild Hyponatremia P:   - KVO IVF. - Diureses as ordered. - BMET in AM. - Replace electrolytes as indicated.  GASTROINTESTINAL A: S/p Ex Lap and colonic resection for bowel obstruction and microperforation S/p Colostomy P:   - Continue Zosyn, if WBC continues to drop will likely d/c in AM. - F/u pathology. - Would like to start TF but hold off for now.  HEMATOLOGIC A: Leukocytosis P:   - Likley due to stress of abdominal surgery, monitor for now.  INFECTIOUS A: Microperforation of large bowel P:   - S/p repair, agree with zoysn. Consider addition of vancomycin if patient fails to improve but white count is improving. - D/Ced cefotetan.  ENDOCRINE A: History of osteoporosis P:   - No  indication for acute management while critically ill.  NEUROLOGIC A: Sedation P:   - Lighten sedation for weaning trials today.  TODAY'S SUMMARY: 78 y/o woman with large bowel obstruction, s/p resection and colostomy placement.  Stricture of unclear etiology seems to be playing large role.  Extubate today with aggressive pulmonary hygienes, no BiPAP.  I have personally obtained a history, examined the patient, evaluated laboratory and imaging results, formulated the assessment and plan and placed orders.  CRITICAL CARE: The patient is critically ill with multiple organ systems failure and requires high complexity decision making for assessment and support, frequent evaluation and titration of therapies, application of advanced monitoring technologies and extensive interpretation of multiple databases. Critical Care Time devoted to patient care services described in this note is 35 minutes.   Rush Farmer, M.D. [facility] Pulmonary/Critical Care Medicine. Pager: [phone]. After hours pager: [phone].

## 2013-10-12 NOTE — Progress Notes (Signed)
ANTIBIOTIC CONSULT NOTE - FOLLOW UP  Pharmacy Consult for zosyn Indication: intra-abdominal coverage  Allergies  Allergen Reactions  . Tape Other (See Comments)    Paper tape only per family of the patient due to thinning of skin  . Cardizem [Diltiazem Hcl] Itching and Rash  . Sulfonamide Derivatives Rash    Patient Measurements: Height: 5\' 5"  (165.1 cm) Weight: 131 lb 9.8 oz (59.7 kg) IBW/kg (Calculated) : 57  Vital Signs: Temp: 97 F (36.1 C) (07/22 0800) Temp src: Axillary (07/22 0800) BP: 134/56 mmHg (07/22 0900) Pulse Rate: 94 (07/22 0900) Intake/Output from previous day: 07/21 0701 - 07/22 0700 In: 903.6 [I.V.:613.6; NG/GT:90; IV Piggyback:200] Out: 2270 [Urine:2220; Emesis/NG output:50] Intake/Output from this shift: Total I/O In: 50 [IV Piggyback:50] Out: -   Labs:  Recent Labs  10/10/13 0353 10/11/13 0410 10/12/13 0403  WBC 20.6* 19.6* 13.8*  HGB 11.1* 10.9* 9.8*  PLT 269 285 266  CREATININE 0.42* 0.67 0.58   Estimated Creatinine Clearance: 54.7 ml/min (by C-G formula based on Cr of 0.58). No results found for this basename: VANCOTROUGH, Corlis Leak, VANCORANDOM, GENTTROUGH, GENTPEAK, GENTRANDOM, TOBRATROUGH, TOBRAPEAK, TOBRARND, AMIKACINPEAK, AMIKACINTROU, AMIKACIN,  in the last 72 hours   Microbiology: Recent Results (from the past 720 hour(s))  SURGICAL PCR SCREEN     Status: None   Collection Time    10/09/13  8:50 PM      Result Value Ref Range Status   MRSA, PCR NEGATIVE  NEGATIVE Final   Staphylococcus aureus NEGATIVE  NEGATIVE Final   Comment:            The Xpert SA Assay (FDA     approved for NASAL specimens     in patients over 59 years of age),     is one component of     a comprehensive surveillance     program.  Test performance has     been validated by [employer] for patients greater     than or equal to 57 year old.     It is not intended     to diagnose infection nor to     guide or monitor treatment.     Anti-infectives   Start     Dose/Rate Route Frequency Ordered Stop   10/10/13 0600  piperacillin-tazobactam (ZOSYN) IVPB 3.375 g     3.375 g 12.5 mL/hr over 240 Minutes Intravenous Every 8 hours 10/09/13 2326     10/09/13 2330  cefoTEtan (CEFOTAN) 2 g in dextrose 5 % 50 mL IVPB    Comments:  Send medication to OR   2 g 100 mL/hr over 30 Minutes Intravenous On call to O.R. 10/09/13 2321 10/10/13 0100   10/09/13 2330  piperacillin-tazobactam (ZOSYN) IVPB 3.375 g     3.375 g 100 mL/hr over 30 Minutes Intravenous  Once 10/09/13 2326 10/10/13 0100   10/09/13 2115  cefoTEtan (CEFOTAN) 2 g in dextrose 5 % 50 mL IVPB  Status:  Discontinued     2 g 100 mL/hr over 30 Minutes Intravenous To Surgery 10/09/13 2105 10/09/13 2321   10/09/13 1930  piperacillin-tazobactam (ZOSYN) IVPB 3.375 g  Status:  Discontinued     3.375 g 12.5 mL/hr over 240 Minutes Intravenous 3 times per day 10/09/13 1923 10/09/13 1928      Assessment: 78 yof continues on D#3 of zosyn for intra-abdominal coverage s/p exlap and colonic resection for bowel obstruction and microperforation. Pt is afebrile and WBC is trending down to 13.8.  Renal function remains good and dose is appropriate.   Zosyn 7/20>>  Goal of Therapy:  Eradication of infection  Plan:  1. Continue zosyn 3.375gm IV Q8H (4 hr inf) 2. F/u renal fxn, C&S, clinical status   Tennelle Taflinger, Rande Lawman 10/12/2013,10:59 AM

## 2013-10-12 NOTE — Procedures (Signed)
Extubation Procedure Note  Patient Details:   Name: [name] DOB: 1935-12-13 MRN: [id number]   Airway Documentation:     Evaluation  O2 sats: stable throughout Complications: No apparent complications Patient did tolerate procedure well. Bilateral Breath Sounds: Clear;Diminished Suctioning: Airway Yes IS instructed and flutter valve performed, patient remains on 4l/min [place] sp[address]   Revonda Standard 10/12/2013, 10:34 AM

## 2013-10-12 NOTE — Progress Notes (Signed)
[facility] ADULT ICU REPLACEMENT PROTOCOL FOR AM LAB REPLACEMENT ONLY  The patient does} apply for the [facility] Adult ICU Electrolyte Replacment Protocol based on the criteria listed below:   1. Is GFR >/= 40 ml/min? Yes.    Patient's GFR today is >90 2. Is urine output >/= 0.5 ml/kg/hr for the last 6 hours? Yes.   Patient's UOP is 1.74 ml/kg/hr 3. Is BUN < 60 mg/dL? Yes.    Patient's BUN today is 11 4. Abnormal electrolyte(s): K+ 3.2 5. Ordered repletion with:see order 6. If a panic level lab has been reported, has the CCM MD in charge been notified? Yes.  .   Physician:  Dr. Wannetta Sender, Anthonie Lotito A 10/12/2013 6:40 AM

## 2013-10-13 ENCOUNTER — Inpatient Hospital Stay (HOSPITAL_COMMUNITY): Payer: Medicare Other

## 2013-10-13 LAB — BASIC METABOLIC PANEL
Anion gap: 12 (ref 5–15)
BUN: 10 mg/dL (ref 6–23)
CALCIUM: 7.8 mg/dL — AB (ref 8.4–10.5)
CO2: 36 mEq/L — ABNORMAL HIGH (ref 19–32)
Chloride: 84 mEq/L — ABNORMAL LOW (ref 96–112)
Creatinine, Ser: 0.54 mg/dL (ref 0.50–1.10)
GFR calc Af Amer: >90 mL/min (ref >90)
Glucose, Bld: 88 mg/dL (ref 70–99)
POTASSIUM: 3 meq/L — AB (ref 3.7–5.3)
SODIUM: 132 meq/L — AB (ref 137–147)

## 2013-10-13 LAB — CBC
HCT: 32.5 % — ABNORMAL LOW (ref 36.0–46.0)
Hemoglobin: 10 g/dL — ABNORMAL LOW (ref 12.0–15.0)
MCH: 25.9 pg — ABNORMAL LOW (ref 26.0–34.0)
MCHC: 30.8 g/dL (ref 30.0–36.0)
MCV: 84.2 fL (ref 78.0–100.0)
Platelets: 262 10*3/uL (ref 150–400)
RBC: 3.86 MIL/uL — ABNORMAL LOW (ref 3.87–5.11)
RDW: 16.7 % — AB (ref 11.5–15.5)
WBC: 11.6 10*3/uL — ABNORMAL HIGH (ref 4.0–10.5)

## 2013-10-13 LAB — GLUCOSE, CAPILLARY
GLUCOSE-CAPILLARY: 98 mg/dL (ref 70–99)
GLUCOSE-CAPILLARY: 87 mg/dL (ref 70–99)
GLUCOSE-CAPILLARY: 89 mg/dL (ref 70–99)
Glucose-Capillary: 106 mg/dL — ABNORMAL HIGH (ref 70–99)
Glucose-Capillary: 94 mg/dL (ref 70–99)
Glucose-Capillary: 88 mg/dL (ref 70–99)

## 2013-10-13 LAB — MAGNESIUM: MAGNESIUM: 1.9 mg/dL (ref 1.5–2.5)

## 2013-10-13 LAB — PHOSPHORUS: PHOSPHORUS: 3.3 mg/dL (ref 2.3–4.6)

## 2013-10-13 MED ORDER — HYDROMORPHONE HCL PF 1 MG/ML IJ SOLN
0.5000 mg | INTRAMUSCULAR | Status: DC | PRN
Start: 2013-10-13 — End: 2013-10-16
  Administered 2013-10-13 (×5): 0.5 mg via INTRAVENOUS
  Administered 2013-10-14 (×2): 1 mg via INTRAVENOUS
  Administered 2013-10-14: 0.5 mg via INTRAVENOUS
  Administered 2013-10-14 – 2013-10-16 (×7): 1 mg via INTRAVENOUS
  Filled 2013-10-13 (×13): qty 1

## 2013-10-13 MED ORDER — FUROSEMIDE 10 MG/ML IJ SOLN
40.0000 mg | Freq: Three times a day (TID) | INTRAMUSCULAR | Status: AC
  Administered 2013-10-13 (×2): 40 mg via INTRAVENOUS
  Filled 2013-10-13 (×2): qty 4

## 2013-10-13 MED ORDER — POTASSIUM CHLORIDE 10 MEQ/50ML IV SOLN
10.0000 meq | INTRAVENOUS | Status: AC
  Administered 2013-10-13 (×6): 10 meq via INTRAVENOUS
  Filled 2013-10-13 (×7): qty 50

## 2013-10-13 NOTE — Progress Notes (Signed)
[facility] Progress Note Patient Name: [name] DOB: 11/28/1935 MRN: [id number]  Date of Service  10/13/2013   HPI/Events of Note     eICU Interventions  Potassium replaced    Intervention Category Intermediate Interventions: Electrolyte abnormality - evaluation and management  BYRUM,ROBERT S. 10/13/2013, 6:11 AM

## 2013-10-13 NOTE — Progress Notes (Signed)
PULMONARY / CRITICAL CARE MEDICINE HISTORY AND PHYSICAL EXAMINATION   Name: [name] MRN: [id number] DOB: 11-20-1935    ADMISSION DATE:  10/09/2013  PRIMARY SERVICE: PCCM  CHIEF COMPLAINT:  Abdominal Surgery  BRIEF PATIENT DESCRIPTION: 78 y/o woman with history of COPD, afib on coumadin, several weeks of severe constipation progressed to large bowel obstruction and taken to OR on 7/19 for ex lap and colostomy.  SIGNIFICANT EVENTS / STUDIES:  Ex Lap 10/09/13  LINES / TUBES: ETT 7/19>>>7/22 L radial A-line 7/19>>> CVC (R IJ) 7/19>>> NG 7/19>>> Foley 7/19>>>  CULTURES: No cultures obtained  ANTIBIOTICS: Cefotetan 7/19>>>7/20 Pip-tazo 7/19>>>  SUBJECTIVE: A-fib with RVR stable with Cardizem drip, profound desaturation overnight, requiring 55%.  VITAL SIGNS: Temp:  [97.2 F (36.2 C)-98.6 F (37 C)] 97.7 F (36.5 C) (07/23 0815) Pulse Rate:  [52-146] 103 (07/23 1024) Resp:  [15-26] 22 (07/23 1024) BP: (95-132)/(44-72) 127/52 mmHg (07/23 0900) SpO2:  [87 %-99 %] 91 % (07/23 1024) Arterial Line BP: (112-147)/(41-58) 117/50 mmHg (07/23 1024) FiO2 (%):  [40 %-50 %] 45 % (07/23 0902) Weight:  [131 lb 2.8 oz (59.5 kg)] 131 lb 2.8 oz (59.5 kg) (07/23 0500) HEMODYNAMICS: A-fib, but normo- to hypertensive CVP:  [4 mmHg-8 mmHg] 7 mmHg VENTILATOR SETTINGS: Vent Mode:  [-]  FiO2 (%):  [40 %-50 %] 45 % INTAKE / OUTPUT: Intake/Output     07/22 0701 - 07/23 0700 07/23 0701 - 07/24 0700   I.V. (mL/kg) 360 (6.1) 45 (0.8)   NG/GT 30 20   IV Piggyback 760 150   Total Intake(mL/kg) 1150 (19.3) 215 (3.6)   Urine (mL/kg/hr) 4595 (3.2) 750 (3.2)   Emesis/NG output     Stool     Total Output 4595 750   Net -3445 -535          PHYSICAL EXAMINATION: General: alert and interactive, appears stated age. Neuro: Interacting and moving all ext to commands. HEENT: NGT in place, [place]/AT, PERRL, EOM-I Neck: No JVD evident Cardiovascular:  Tachycardic, irregular rate, no  M/R/G Lungs:  Diminished breath sounds at the bases, no wheezes Abdomen:  Midline dressing was not removed, scant blood seen in ostomy bag.  LABS:  CBC  Recent Labs Lab 10/11/13 0410 10/12/13 0403 10/13/13 0400  WBC 19.6* 13.8* 11.6*  HGB 10.9* 9.8* 10.0*  HCT 34.9* 31.2* 32.5*  PLT 285 266 262   Coag's  Recent Labs Lab 10/09/13 1900  INR 1.39   BMET  Recent Labs Lab 10/11/13 0410 10/12/13 0403 10/13/13 0400  NA 134* 134* 132*  K 3.8 3.2* 3.0*  CL 91* 88* 84*  CO2 27 34* 36*  BUN 10 11 10   CREATININE 0.67 0.58 0.54  GLUCOSE 149* 112* 88   Electrolytes  Recent Labs Lab 10/11/13 0410 10/12/13 0403 10/13/13 0400  CALCIUM 7.4* 7.6* 7.8*  MG 1.7 1.6 1.9  PHOS 3.2 2.6 3.3   Sepsis Markers  Recent Labs Lab 10/09/13 1900  LATICACIDVEN 0.7   ABG  Recent Labs Lab 10/11/13 0416 10/12/13 0409 10/12/13 1156  PHART 7.518* 7.584* 7.487*  PCO2ART 40.8 42.4 51.8*  PO2ART 66.0* 76.0* 48.0*   Liver Enzymes  Recent Labs Lab 10/09/13 1900 10/10/13 0353  AST 13 14  ALT 7 7  ALKPHOS 107 75  BILITOT 0.8 1.5*  ALBUMIN 2.1* 2.0*   Cardiac Enzymes No results found for this basename: TROPONINI, PROBNP,  in the last 168 hours Glucose  Recent Labs Lab 10/12/13 1251 10/12/13 1616 10/12/13 1914 10/12/13 2322  10/13/13 0335 10/13/13 0832  GLUCAP 111* 125* 134* 75 98 87   Imaging Dg Chest Port 1 View  10/12/2013   CLINICAL DATA:  Status post hip arthroplasty  EXAM: PORTABLE CHEST - 1 VIEW  COMPARISON:  Portable chest x-ray of October 11, 2013  FINDINGS: The lungs are slightly better inflated today. The pulmonary interstitium has improved. There remain small bilateral pleural effusions and there is left lower lobe atelectasis. The cardiac silhouette is top-normal in size. The pulmonary vascularity is normal.  The endotracheal tube tip lies 5.2 cm above the crotch of the carina. The esophagogastric tube tip and proximal port lie below the GE junction. The  right internal jugular venous catheter tip lies in the proximal SVC. The permanent pacemaker is in appropriate position.  IMPRESSION: There has been interval improvement in the appearance of the lungs consistent with resolving interstitial edema. There remains left lower lobe atelectasis and small bilateral pleural effusions. The support tubes and lines are in appropriate position.   Electronically Signed   By: David  Martinique   On: 10/12/2013 08:04   EKG: Afib CXR: ETT and IJ in good position, mild atelectasis. No infiltrates.  ASSESSMENT / PLAN:  Principal Problem:   Bowel obstruction Active Problems:   Essential hypertension, benign   Permanent atrial fibrillation   SICK SINUS SYNDROME   COPD   Chronic respiratory failure   Acute respiratory failure   PULMONARY A: Severe COPD Failure to Liberate from Mechanical Ventilation P:   - Spoke with patient and recommended aggressive pulmonary hygienes. - OOB to chair as able. - Titrate O2 for sat of 88-92%. - IS and flutter valve. - Diureses as ordered.  CARDIOVASCULAR A: Atrial Fibrillation with RVR Hypertension History of Sick Sinus Syndrome P:   - Follow CVP. - Continue dilt drip for rate control until able to use PO.  RENAL A: Mild Hyponatremia P:   - KVO IVF. - Lasix x2 doses. - BMET in AM. - Replace electrolytes as indicated.  GASTROINTESTINAL A: S/p Ex Lap and colonic resection for bowel obstruction and microperforation S/p Colostomy P:   - Continue zosyn for a total of 7 days per surgeries recommendations. - F/u pathology negative. - Maintain NPO for now.  HEMATOLOGIC A: Leukocytosis P:   - Likley due to stress of abdominal surgery, monitor for now.  INFECTIOUS A: Microperforation of large bowel P:   - S/p repair, agree with zoysn, will continue for 7 days total. - D/Ced cefotetan.  ENDOCRINE A: History of osteoporosis P:   - No indication for acute management while critically  ill.  NEUROLOGIC A: Sedation P:   - Lighten sedation for weaning trials today.  TODAY'S SUMMARY: 78 y/o woman with large bowel obstruction, s/p resection and colostomy placement.  Pathology benign.  Stricture of unclear etiology seems to be playing large role.  Extubated but developed respiratory failure overnight with need of 55% FiO2, will diurese, ambulate and push pulmonary hygienes to hopefully avoid reintubation, hold in the ICU overnight.  I have personally obtained a history, examined the patient, evaluated laboratory and imaging results, formulated the assessment and plan and placed orders.  CRITICAL CARE: The patient is critically ill with multiple organ systems failure and requires high complexity decision making for assessment and support, frequent evaluation and titration of therapies, application of advanced monitoring technologies and extensive interpretation of multiple databases. Critical Care Time devoted to patient care services described in this note is 35 minutes.   Rush Farmer, M.D.  [place]. Pager: [phone]. After hours pager: [phone].

## 2013-10-13 NOTE — Progress Notes (Signed)
Patient ID: [name], female   DOB: 29-May-1935, 78 y.o.   MRN: [id number] 4 Days Post-Op  Subjective: Patient was extubated yesterday. Breathing with a mask today. Complains of abdominal pain mostly around her incision.  Objective: Vital signs in last 24 hours: Temp:  [97.2 F (36.2 C)-98.6 F (37 C)] 97.2 F (36.2 C) (07/23 0403) Pulse Rate:  [52-146] 106 (07/23 0815) Resp:  [15-26] 24 (07/23 0815) BP: (95-134)/(44-72) 127/58 mmHg (07/23 0800) SpO2:  [87 %-99 %] 94 % (07/23 0815) Arterial Line BP: (112-147)/(41-58) 140/54 mmHg (07/23 0815) FiO2 (%):  [40 %-55 %] 55 % (07/23 0815) Weight:  [131 lb 2.8 oz (59.5 kg)] 131 lb 2.8 oz (59.5 kg) (07/23 0500) Last BM Date: 10/02/13  Intake/Output from previous day: 07/22 0701 - 07/23 0700 In: 1150 [I.V.:360; NG/GT:30; IV Piggyback:760] Out: 4595 [Urine:4595] Intake/Output this shift: Total I/O In: 85 [I.V.:15; NG/GT:20; IV Piggyback:50] Out: 300 [Urine:300]  PE: Abd: Soft, minimal upper abdominal distention, improved from yesterday after NG tube functioning better. Absent bowel sounds. Stoma is viable and less edematous today. She still has essentially no output from her ostomy except for some sweats. Her wound is clean and intact. Wound was evaluated and repacked.  Lab Results:   Recent Labs  10/12/13 0403 10/13/13 0400  WBC 13.8* 11.6*  HGB 9.8* 10.0*  HCT 31.2* 32.5*  PLT 266 262   BMET  Recent Labs  10/12/13 0403 10/13/13 0400  NA 134* 132*  K 3.2* 3.0*  CL 88* 84*  CO2 34* 36*  GLUCOSE 112* 88  BUN 11 10  CREATININE 0.58 0.54  CALCIUM 7.6* 7.8*   PT/INR No results found for this basename: LABPROT, INR,  in the last 72 hours CMP     Component Value Date/Time   NA 132* 10/13/2013 0400   K 3.0* 10/13/2013 0400   CL 84* 10/13/2013 0400   CO2 36* 10/13/2013 0400   GLUCOSE 88 10/13/2013 0400   BUN 10 10/13/2013 0400   CREATININE 0.54 10/13/2013 0400   CALCIUM 7.8* 10/13/2013 0400   PROT 4.7* 10/10/2013  0353   ALBUMIN 2.0* 10/10/2013 0353   AST 14 10/10/2013 0353   ALT 7 10/10/2013 0353   ALKPHOS 75 10/10/2013 0353   BILITOT 1.5* 10/10/2013 0353   GFRNONAA >90 10/13/2013 0400   GFRAA >90 10/13/2013 0400   Lipase     Component Value Date/Time   LIPASE 9* 09/10/2012 0431       Studies/Results: Dg Chest Port 1 View  10/12/2013   CLINICAL DATA:  Status post hip arthroplasty  EXAM: PORTABLE CHEST - 1 VIEW  COMPARISON:  Portable chest x-ray of October 11, 2013  FINDINGS: The lungs are slightly better inflated today. The pulmonary interstitium has improved. There remain small bilateral pleural effusions and there is left lower lobe atelectasis. The cardiac silhouette is top-normal in size. The pulmonary vascularity is normal.  The endotracheal tube tip lies 5.2 cm above the crotch of the carina. The esophagogastric tube tip and proximal port lie below the GE junction. The right internal jugular venous catheter tip lies in the proximal SVC. The permanent pacemaker is in appropriate position.  IMPRESSION: There has been interval improvement in the appearance of the lungs consistent with resolving interstitial edema. There remains left lower lobe atelectasis and small bilateral pleural effusions. The support tubes and lines are in appropriate position.   Electronically Signed   By: David  Martinique   On: 10/12/2013 08:04    Anti-infectives:  Anti-infectives   Start     Dose/Rate Route Frequency Ordered Stop   10/10/13 0600  piperacillin-tazobactam (ZOSYN) IVPB 3.375 g     3.375 g 12.5 mL/hr over 240 Minutes Intravenous Every 8 hours 10/09/13 2326     10/09/13 2330  cefoTEtan (CEFOTAN) 2 g in dextrose 5 % 50 mL IVPB    Comments:  Send medication to OR   2 g 100 mL/hr over 30 Minutes Intravenous On call to O.R. 10/09/13 2321 10/10/13 0100   10/09/13 2330  piperacillin-tazobactam (ZOSYN) IVPB 3.375 g     3.375 g 100 mL/hr over 30 Minutes Intravenous  Once 10/09/13 2326 10/10/13 0100   10/09/13 2115   cefoTEtan (CEFOTAN) 2 g in dextrose 5 % 50 mL IVPB  Status:  Discontinued     2 g 100 mL/hr over 30 Minutes Intravenous To Surgery 10/09/13 2105 10/09/13 2321   10/09/13 1930  piperacillin-tazobactam (ZOSYN) IVPB 3.375 g  Status:  Discontinued     3.375 g 12.5 mL/hr over 240 Minutes Intravenous 3 times per day 10/09/13 1923 10/09/13 1928       Assessment/Plan   1. POD4, status post exploratory laparotomy with Hartman's procedure for colon obstruction  2. 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 COPD  4. ventilator dependent respiratory failure, status post extubation  5. Atrial fibrillation  6. Hypertension  7. Leukocytosis, improving  Plan: 1. Continue NG tube for now. We will obtain a 2 view abdominal x-ray today to evaluate her ileus. 2. Continue antibiotic therapy, we'll likely need a total of 7 days from our standpoint. 3. Continue routine ostomy care and twice a day dressing changes. 4. Begin to Mobilize with physical therapy as able.  LOS: 4 days    [name] E 10/13/2013, 8:56 AM Pager: [phone]

## 2013-10-13 NOTE — Plan of Care (Signed)
Problem: Phase II Progression Outcomes Goal: Tolerating diet Outcome: Not Progressing Pt remains NPO

## 2013-10-13 NOTE — Consult Note (Addendum)
WOC wound follow up Wound type: surgical  Measurement:7cm x 2cm x 2cm  Wound bed: clean, subcutaneous tissue, pink Drainage (amount, consistency, odor) minimal, serous  Periwound: intact Dressing procedure/placement/frequency: Dressing change at the bedside with CCS, wound to continue with NS moist gauze dressings BID  WOC ostomy follow up Stoma type/location: LLQ, end colostomy Stomal assessment/size: 2" round, edematous, budded Peristomal assessment: intact Treatment options for stomal/peristomal skin: none Output bloody drainage only Ostomy pouching: 2pc. 2 3/4"  Education provided: explained rationale for ostomy creation to the patient now that she is off vent and awake.   No family at the bedside at the time of my visit.  She is having some pain, premedicated prior to dressing change and ostomy pouch change. Will bring in educational materials for patient and family tom.  WOC will follow along with you for ostomy care and teaching.  Kailani Brass Liane Comber RN,CWOCN [phone]  Addendum: nurse requested me to look at the patients buttocks. She has sacral dressing in place, I removed and the skin is intact and clear.  The left buttock has small less than 0.5cmx 0.5cm that is partial thickness and appears to be healing well. Will continue foam dressings and close monitor for any changes.

## 2013-10-13 NOTE — Progress Notes (Signed)
I have seen and examined the pt and agree with PA-Osborne's progress note.  

## 2013-10-13 NOTE — Care Management Note (Signed)
  Page 1 of 1   10/18/2013     10:42:05 AM CARE MANAGEMENT NOTE 10/18/2013  Patient:  [name], [name]   Account Number:  [id number]  Date Initiated:  10/13/2013  Documentation initiated by:  [facility]  Subjective/Objective Assessment:   Admitted with SBO - vent post op     Action/Plan:   Anticipated DC Date:  10/19/2013   Anticipated DC Plan:  SKILLED NURSING FACILITY  In-house referral  Clinical Social Worker      DC Planning Services  CM consult      Choice offered to / List presented to:             Status of service:  In process, will continue to follow Medicare Important Message given?  YES (If response is "NO", the following Medicare IM given date fields will be blank) Date Medicare IM given:  10/13/2013 Medicare IM given by:  [facility] Date Additional Medicare IM given:  10/17/2013 Additional Medicare IM given by:  Magdalen Spatz  Discharge Disposition:    Per UR Regulation:  Reviewed for med. necessity/level of care/duration of stay  If discussed at [place] of Stay Meetings, dates discussed:   10/18/2013    Comments:  Contact:  Corcum,Carla Daughter [place]                 Charna Archer [phone]  10-18-13 Patient was active with [facility] prior to admission . Has decided on SNF at discharge . SW aware and following. Magdalen Spatz RN BSN [phone]  10-13-13 1:30pm Luz Lex, RNBSN [phone] Extubated.  But resp status borderline.  On VM with desats. CM will continue to follow.

## 2013-10-14 ENCOUNTER — Inpatient Hospital Stay (HOSPITAL_COMMUNITY): Payer: Medicare Other

## 2013-10-14 DIAGNOSIS — E876 Hypokalemia: Secondary | ICD-10-CM

## 2013-10-14 LAB — CBC
HCT: 31.6 % — ABNORMAL LOW (ref 36.0–46.0)
HEMOGLOBIN: 9.7 g/dL — AB (ref 12.0–15.0)
MCH: 26.6 pg (ref 26.0–34.0)
MCHC: 30.7 g/dL (ref 30.0–36.0)
MCV: 86.6 fL (ref 78.0–100.0)
Platelets: 252 10*3/uL (ref 150–400)
RBC: 3.65 MIL/uL — AB (ref 3.87–5.11)
RDW: 16.6 % — ABNORMAL HIGH (ref 11.5–15.5)
WBC: 9.5 10*3/uL (ref 4.0–10.5)

## 2013-10-14 LAB — GLUCOSE, CAPILLARY
GLUCOSE-CAPILLARY: 81 mg/dL (ref 70–99)
GLUCOSE-CAPILLARY: 77 mg/dL (ref 70–99)
GLUCOSE-CAPILLARY: 117 mg/dL — AB (ref 70–99)
GLUCOSE-CAPILLARY: 101 mg/dL — AB (ref 70–99)
Glucose-Capillary: 137 mg/dL — ABNORMAL HIGH (ref 70–99)

## 2013-10-14 LAB — BASIC METABOLIC PANEL
Anion gap: 10 (ref 5–15)
BUN: 13 mg/dL (ref 6–23)
CHLORIDE: 85 meq/L — AB (ref 96–112)
CO2: 36 mEq/L — ABNORMAL HIGH (ref 19–32)
Calcium: 8.2 mg/dL — ABNORMAL LOW (ref 8.4–10.5)
Creatinine, Ser: 0.53 mg/dL (ref 0.50–1.10)
GFR calc Af Amer: >90 mL/min (ref >90)
GFR calc non Af Amer: >90 mL/min (ref >90)
GLUCOSE: 85 mg/dL (ref 70–99)
Potassium: 3.3 mEq/L — ABNORMAL LOW (ref 3.7–5.3)
Sodium: 131 mEq/L — ABNORMAL LOW (ref 137–147)

## 2013-10-14 LAB — PHOSPHORUS: PHOSPHORUS: 2.8 mg/dL (ref 2.3–4.6)

## 2013-10-14 LAB — MAGNESIUM: MAGNESIUM: 1.8 mg/dL (ref 1.5–2.5)

## 2013-10-14 MED ORDER — POLYETHYLENE GLYCOL 3350 17 G PO PACK
17.0000 g | PACK | Freq: Every day | ORAL | Status: DC
  Administered 2013-10-14 – 2013-10-19 (×6): 17 g via ORAL
  Filled 2013-10-14 (×6): qty 1

## 2013-10-14 MED ORDER — POTASSIUM CHLORIDE CRYS ER 20 MEQ PO TBCR
40.0000 meq | EXTENDED_RELEASE_TABLET | Freq: Three times a day (TID) | ORAL | Status: AC
  Administered 2013-10-14 (×2): 40 meq via ORAL
  Filled 2013-10-14 (×2): qty 2

## 2013-10-14 MED ORDER — POTASSIUM CHLORIDE 10 MEQ/50ML IV SOLN
10.0000 meq | INTRAVENOUS | Status: AC
  Administered 2013-10-14 (×2): 10 meq via INTRAVENOUS
  Filled 2013-10-14 (×2): qty 50

## 2013-10-14 MED ORDER — FUROSEMIDE 10 MG/ML IJ SOLN
40.0000 mg | Freq: Three times a day (TID) | INTRAMUSCULAR | Status: AC
  Administered 2013-10-14 (×2): 40 mg via INTRAVENOUS
  Filled 2013-10-14 (×2): qty 4

## 2013-10-14 MED ORDER — DILTIAZEM HCL 60 MG PO TABS
60.0000 mg | ORAL_TABLET | Freq: Four times a day (QID) | ORAL | Status: AC
  Administered 2013-10-14 – 2013-10-16 (×10): 60 mg via ORAL
  Filled 2013-10-14 (×12): qty 1

## 2013-10-14 MED ORDER — LORAZEPAM 2 MG/ML IJ SOLN
0.5000 mg | Freq: Once | INTRAMUSCULAR | Status: AC
  Administered 2013-10-14: 0.5 mg via INTRAVENOUS
  Filled 2013-10-14: qty 1

## 2013-10-14 NOTE — Progress Notes (Signed)
PULMONARY / CRITICAL CARE MEDICINE HISTORY AND PHYSICAL EXAMINATION   Name: [name] MRN: [id number] DOB: 1935/10/05    ADMISSION DATE:  10/09/2013  PRIMARY SERVICE: PCCM  CHIEF COMPLAINT:  Abdominal Surgery  BRIEF PATIENT DESCRIPTION: 78 y/o woman with history of COPD, afib on coumadin, several weeks of severe constipation progressed to large bowel obstruction and taken to OR on 7/19 for ex lap and colostomy.  SIGNIFICANT EVENTS / STUDIES:  Ex Lap 10/09/13  LINES / TUBES: ETT 7/19>>>7/22 L radial A-line 7/19>>>7/24 CVC (R IJ) 7/19>>> NG 7/19>>>7/24 Foley 7/19>>>7/24  CULTURES: No cultures obtained  ANTIBIOTICS: Cefotetan 7/19>>>7/20 Pip-tazo 7/19>>>  SUBJECTIVE: No events overnight.  VITAL SIGNS: Temp:  [97.8 F (36.6 C)-98.2 F (36.8 C)] 97.8 F (36.6 C) (07/24 0800) Pulse Rate:  [45-116] 116 (07/24 0900) Resp:  [13-28] 23 (07/24 0900) BP: (91-130)/(40-107) 129/63 mmHg (07/24 0900) SpO2:  [34 %-97 %] 88 % (07/24 0900) Arterial Line BP: (91-154)/(36-64) 143/57 mmHg (07/24 0900) FiO2 (%):  [35 %-45 %] 40 % (07/24 0820)  HEMODYNAMICS: A-fib, but normo- to hypertensive CVP:  [4 mmHg-9 mmHg] 5 mmHg  VENTILATOR SETTINGS: Vent Mode:  [-]  FiO2 (%):  [35 %-45 %] 40 %  INTAKE / OUTPUT: Intake/Output     07/23 0701 - 07/24 0700 07/24 0701 - 07/25 0700   I.V. (mL/kg) 455 (7.6)    NG/GT 80    IV Piggyback 412.5 50   Total Intake(mL/kg) 947.5 (15.9) 50 (0.8)   Urine (mL/kg/hr) 2940 (2.1)    Emesis/NG output 250 (0.2)    Total Output 3190     Net -2242.5 +50         PHYSICAL EXAMINATION: General: alert and interactive, appears stated age. Neuro: Interacting and moving all ext to commands. HEENT: NGT out, [place]/AT, PERRL, EOM-I Neck: No JVD evident Cardiovascular:  Tachycardic, irregular rate, no M/R/G Lungs:  Diminished breath sounds at the bases, no wheezes Abdomen:  Midline dressing was not removed, scant blood seen in ostomy  bag.  LABS:  CBC  Recent Labs Lab 10/12/13 0403 10/13/13 0400 10/14/13 0400  WBC 13.8* 11.6* 9.5  HGB 9.8* 10.0* 9.7*  HCT 31.2* 32.5* 31.6*  PLT 266 262 252   Coag's  Recent Labs Lab 10/09/13 1900  INR 1.39   BMET  Recent Labs Lab 10/12/13 0403 10/13/13 0400 10/14/13 0400  NA 134* 132* 131*  K 3.2* 3.0* 3.3*  CL 88* 84* 85*  CO2 34* 36* 36*  BUN 11 10 13   CREATININE 0.58 0.54 0.53  GLUCOSE 112* 88 85   Electrolytes  Recent Labs Lab 10/12/13 0403 10/13/13 0400 10/14/13 0400  CALCIUM 7.6* 7.8* 8.2*  MG 1.6 1.9 1.8  PHOS 2.6 3.3 2.8   Sepsis Markers  Recent Labs Lab 10/09/13 1900  LATICACIDVEN 0.7   ABG  Recent Labs Lab 10/11/13 0416 10/12/13 0409 10/12/13 1156  PHART 7.518* 7.584* 7.487*  PCO2ART 40.8 42.4 51.8*  PO2ART 66.0* 76.0* 48.0*   Liver Enzymes  Recent Labs Lab 10/09/13 1900 10/10/13 0353  AST 13 14  ALT 7 7  ALKPHOS 107 75  BILITOT 0.8 1.5*  ALBUMIN 2.1* 2.0*   Cardiac Enzymes No results found for this basename: TROPONINI, PROBNP,  in the last 168 hours Glucose  Recent Labs Lab 10/13/13 1206 10/13/13 1648 10/13/13 1920 10/13/13 2325 10/14/13 0354 10/14/13 0814  GLUCAP 106* 89 94 88 81 77   Imaging Dg Chest Port 1 View  10/14/2013   CLINICAL DATA:  78 year old female  status post laparotomy, bowel surgery. Initial encounter.  EXAM: PORTABLE CHEST - 1 VIEW  COMPARISON:  10/12/2013 and earlier.  FINDINGS: Portable AP semi upright view at 0633 hrs. Extubated. Stable lung volumes. Stable enteric tube. Stable right IJ central line. Stable left chest cardiac pacemaker. Stable cardiac size and mediastinal contours. Continued small bilateral pleural effusions and dense retrocardiac opacity. Pulmonary vascular congestion has increased, no overt edema.  IMPRESSION: 1. Extubated.  Otherwise, stable lines and tubes. 2. Increased pulmonary vascular congestion without overt edema. 3. Stable bilateral pleural effusions and lower  lobe collapse or consolidation.   Electronically Signed   By: Lars Pinks M.D.   On: 10/14/2013 07:52   Dg Abd Portable 1v  10/13/2013   CLINICAL DATA:  Abdominal discomfort.  EXAM: PORTABLE ABDOMEN - 1 VIEW  COMPARISON:  CT 10/07/2013.  FINDINGS: NG tube noted projected over stomach. The stomach is nondistended. Ostomy site left abdomen now noted. Persistent distention of the left, transverse, and right colon noted. The distention has diminished from prior exam. Stool is noted in colon. No free air. Aortoiliac atherosclerotic vascular disease.Bilateral hip replacements degenerative changes scoliosis lumbar spine.  IMPRESSION: 1. Left abdomen ostomy site now noted. 2. Previously identified colonic distention remains but had subsided partially. 3. NG tube is noted projected over stomach.  No gastric distention.   Electronically Signed   By: Marcello Moores  Register   On: 10/13/2013 16:55   EKG: Afib CXR: ETT and IJ in good position, mild atelectasis. No infiltrates.  ASSESSMENT / PLAN:  Principal Problem:   Bowel obstruction Active Problems:   Essential hypertension, benign   Permanent atrial fibrillation   SICK SINUS SYNDROME   COPD   Chronic respiratory failure   Acute respiratory failure   PULMONARY A: Severe COPD Failure to Liberate from Mechanical Ventilation P:   - OOB to chair as able. - Titrate O2 for sat of 88-92%. - IS and flutter valve. - Diureses as ordered.  CARDIOVASCULAR A: Atrial Fibrillation with RVR Hypertension History of Sick Sinus Syndrome P:   - D/C CVP. - Change dilt drip to PO 60 mg q6 hours.  RENAL A: Mild Hyponatremia P:   - KVO IVF. - Lasix x2 doses. - BMET in AM. - Replace electrolytes as indicated.  GASTROINTESTINAL A: S/p Ex Lap and colonic resection for bowel obstruction and microperforation S/p Colostomy P:   - Continue zosyn for a total of 7 days per surgeries recommendations. - F/u pathology negative. - Heart healthy  diet.  HEMATOLOGIC A: Leukocytosis P:   - Likley due to stress of abdominal surgery, monitor for now.  INFECTIOUS A: Microperforation of large bowel P:   - S/p repair, agree with zoysn, will continue for 7 days total. - D/Ced cefotetan.  ENDOCRINE A: History of osteoporosis P:   - No indication for acute management while critically ill.  NEUROLOGIC A: Sedation P:   - Lighten sedation for weaning trials today.  TODAY'S SUMMARY: Transfer to SDU, ant to [facility], PCCM will sign off, please call back if needed.  I have personally obtained a history, examined the patient, evaluated laboratory and imaging results, formulated the assessment and plan and placed orders.  Rush Farmer, M.D. [facility] Pulmonary/Critical Care Medicine. Pager: [phone]. After hours pager: [phone].

## 2013-10-14 NOTE — Evaluation (Signed)
Physical Therapy Evaluation Patient Details Name: [name] MRN: [id number] DOB: 06-Aug-1935 Today's Date: 10/14/2013   History of Present Illness  Pt adm with large bowel obstruction and underwent exploratory lap and colostomy on 7/19. Pt with recent (May 2015) lt hip fx with hemiarthroplasty, severe COPD, rt hip fx  Clinical Impression  Pt admitted with above. Pt currently with functional limitations due to the deficits listed below (see PT Problem List).  Pt will benefit from skilled PT to increase their independence and safety with mobility to allow discharge to the venue listed below.       Follow Up Recommendations LTACH    Equipment Recommendations  None recommended by PT    Recommendations for Other Services       Precautions / Restrictions Precautions Precautions: Fall      Mobility  Bed Mobility Overal bed mobility: Needs Assistance;+2 for physical assistance Bed Mobility: Supine to Sit     Supine to sit: +2 for physical assistance;Mod assist     General bed mobility comments: Assist to bring legs off bed and to elevate trunk.  Transfers Overall transfer level: Needs assistance Equipment used: Rolling walker (2 wheeled) Transfers: Sit to/from [facility] Sit to Stand: +2 physical assistance;Mod assist Stand pivot transfers: +2 physical assistance;Mod assist       General transfer comment: Verbal cues for technique and assist to bring hips up. Used rolling walker for bed to chair.  Ambulation/Gait                Stairs            Wheelchair Mobility    Modified Rankin (Stroke Patients Only)       Balance Overall balance assessment: Needs assistance Sitting-balance support: Bilateral upper extremity supported;Feet supported Sitting balance-Leahy Scale: Poor Sitting balance - Comments: Min A  to sit x 10 minutes Postural control: Posterior lean Standing balance support: Bilateral upper extremity  supported Standing balance-Leahy Scale: Poor Standing balance comment: support of walker and +2 min A                             Pertinent Vitals/Pain SaO2 >90%    Home Living Family/patient expects to be discharged to:: Other (Comment) (LTACH) Living Arrangements: Children Available Help at Discharge: Family Type of Home: House Home Access: Stairs to enter Entrance Stairs-Rails: Right Entrance Stairs-Number of Steps: 2 Home Layout: One level Home Equipment: Walker - 2 wheels;Wheelchair - manual;Bedside commode;Cane - single point;Crutches;Other (comment) (O2) Additional Comments: Pt has been using rolling walker since her hip fx 2 months ago.    Prior Function Level of Independence: Independent with assistive device(s)               Hand Dominance        Extremity/Trunk Assessment   Upper Extremity Assessment: Generalized weakness           Lower Extremity Assessment: Generalized weakness         Communication   Communication: No difficulties  Cognition Arousal/Alertness: Awake/alert Behavior During Therapy: Anxious Overall Cognitive Status: Within Functional Limits for tasks assessed       Memory: Decreased short-term memory              General Comments      Exercises        Assessment/Plan    PT Assessment Patient needs continued PT services  PT Diagnosis Difficulty walking;Generalized weakness   PT  Problem List Decreased strength;Decreased activity tolerance;Decreased balance;Decreased mobility;Decreased knowledge of use of DME;Cardiopulmonary status limiting activity  PT Treatment Interventions DME instruction;Gait training;Functional mobility training;Therapeutic activities;Therapeutic exercise;Balance training;Patient/family education   PT Goals (Current goals can be found in the Care Plan section) Acute Rehab PT Goals Patient Stated Goal: Return home PT Goal Formulation: With patient Time For Goal Achievement:  10/28/13 Potential to Achieve Goals: Good    Frequency Min 3X/week   Barriers to discharge        Co-evaluation               End of Session   Activity Tolerance: Patient limited by fatigue Patient left: in chair;with call bell/phone within reach;with family/visitor present Nurse Communication: Mobility status         Time: 0915-0949 PT Time Calculation (min): 34 min   Charges:   PT Evaluation $Initial PT Evaluation Tier I: 1 Procedure PT Treatments $Gait Training: 23-37 mins   PT G Codes:          Jamarl Pew 2013/10/24, 11:18 AM  Suanne Marker PT [phone]

## 2013-10-14 NOTE — Progress Notes (Signed)
ANTIBIOTIC CONSULT NOTE - FOLLOW UP  Pharmacy Consult for zosyn Indication: intra-abdominal coverage    Allergies  Allergen Reactions  . Tape Other (See Comments)    Paper tape only per family of the patient due to thinning of skin  . Cardizem [Diltiazem Hcl] Itching and Rash  . Sulfonamide Derivatives Rash    Patient Measurements: Height: 5\' 5"  (165.1 cm) Weight: 131 lb 2.8 oz (59.5 kg) IBW/kg (Calculated) : 57   Vital Signs: Temp: 97.8 F (36.6 C) (07/24 0800) Temp src: Oral (07/24 0800) BP: 129/63 mmHg (07/24 0900) Pulse Rate: 116 (07/24 0900) Intake/Output from previous day: 07/23 0701 - 07/24 0700 In: 947.5 [I.V.:455; NG/GT:80; IV Piggyback:412.5] Out: 3190 [Urine:2940; Emesis/NG output:250] Intake/Output from this shift: Total I/O In: 50 [IV Piggyback:50] Out: -   Labs:  Recent Labs  10/12/13 0403 10/13/13 0400 10/14/13 0400  WBC 13.8* 11.6* 9.5  HGB 9.8* 10.0* 9.7*  PLT 266 262 252  CREATININE 0.58 0.54 0.53   Estimated Creatinine Clearance: 54.7 ml/min (by C-G formula based on Cr of 0.53). No results found for this basename: VANCOTROUGH, Corlis Leak, VANCORANDOM, GENTTROUGH, GENTPEAK, GENTRANDOM, TOBRATROUGH, TOBRAPEAK, TOBRARND, AMIKACINPEAK, AMIKACINTROU, AMIKACIN,  in the last 72 hours   Microbiology: Recent Results (from the past 720 hour(s))  SURGICAL PCR SCREEN     Status: None   Collection Time    10/09/13  8:50 PM      Result Value Ref Range Status   MRSA, PCR NEGATIVE  NEGATIVE Final   Staphylococcus aureus NEGATIVE  NEGATIVE Final   Comment:            The Xpert SA Assay (FDA     approved for NASAL specimens     in patients over 75 years of age),     is one component of     a comprehensive surveillance     program.  Test performance has     been validated by [employer] for patients greater     than or equal to 59 year old.     It is not intended     to diagnose infection nor to     guide or monitor treatment.     Anti-infectives   Start     Dose/Rate Route Frequency Ordered Stop   10/10/13 0600  piperacillin-tazobactam (ZOSYN) IVPB 3.375 g     3.375 g 12.5 mL/hr over 240 Minutes Intravenous Every 8 hours 10/09/13 2326     10/09/13 2330  cefoTEtan (CEFOTAN) 2 g in dextrose 5 % 50 mL IVPB    Comments:  Send medication to OR   2 g 100 mL/hr over 30 Minutes Intravenous On call to O.R. 10/09/13 2321 10/10/13 0100   10/09/13 2330  piperacillin-tazobactam (ZOSYN) IVPB 3.375 g     3.375 g 100 mL/hr over 30 Minutes Intravenous  Once 10/09/13 2326 10/10/13 0100   10/09/13 2115  cefoTEtan (CEFOTAN) 2 g in dextrose 5 % 50 mL IVPB  Status:  Discontinued     2 g 100 mL/hr over 30 Minutes Intravenous To Surgery 10/09/13 2105 10/09/13 2321   10/09/13 1930  piperacillin-tazobactam (ZOSYN) IVPB 3.375 g  Status:  Discontinued     3.375 g 12.5 mL/hr over 240 Minutes Intravenous 3 times per day 10/09/13 1923 10/09/13 1928      Assessment: Patient is a 78 y.o F s/p exlap w/ Hartman's procedure for colon obstruction on 7/19.  Currently on zosyn day #5 for intra-abdominal coverage with plan  to treat for 7 days.  Scr remains stable.  No cultures, afeb, wbc down 9.5.   Plan:  1. Continue zosyn 3.375gm IV Q8H (4 hr inf)    Krysta Bloomfield P 10/14/2013,11:48 AM

## 2013-10-14 NOTE — Progress Notes (Signed)
Patient ID: [name], female   DOB: February 09, 1936, 78 y.o.   MRN: [id number] 5 Days Post-Op  Subjective: Patient sleepy, but feels improved. Pain is better controlled.  Objective: Vital signs in last 24 hours: Temp:  [97.8 F (36.6 C)-98.2 F (36.8 C)] 97.8 F (36.6 C) (07/24 0400) Pulse Rate:  [45-116] 116 (07/24 0900) Resp:  [13-28] 23 (07/24 0900) BP: (91-130)/(40-107) 129/63 mmHg (07/24 0900) SpO2:  [34 %-97 %] 88 % (07/24 0900) Arterial Line BP: (91-154)/(36-64) 143/57 mmHg (07/24 0900) FiO2 (%):  [35 %-45 %] 40 % (07/24 0820) Last BM Date: 10/02/13  Intake/Output from previous day: 07/23 0701 - 07/24 0700 In: 947.5 [I.V.:455; NG/GT:80; IV Piggyback:412.5] Out: 3190 [Urine:2940; Emesis/NG output:250] Intake/Output this shift: Total I/O In: 40 [IV Piggyback:50] Out: -   PE: Abd: soft, some active bowel sounds, minimal NG tube output. Ostomy with lots of flatus. No significant feculent output. Stoma continues to decrease in edema. Wound is clean and packed  Lab Results:   Recent Labs  10/13/13 0400 10/14/13 0400  WBC 11.6* 9.5  HGB 10.0* 9.7*  HCT 32.5* 31.6*  PLT 262 252   BMET  Recent Labs  10/13/13 0400 10/14/13 0400  NA 132* 131*  K 3.0* 3.3*  CL 84* 85*  CO2 36* 36*  GLUCOSE 88 85  BUN 10 13  CREATININE 0.54 0.53  CALCIUM 7.8* 8.2*   PT/INR No results found for this basename: LABPROT, INR,  in the last 72 hours CMP     Component Value Date/Time   NA 131* 10/14/2013 0400   K 3.3* 10/14/2013 0400   CL 85* 10/14/2013 0400   CO2 36* 10/14/2013 0400   GLUCOSE 85 10/14/2013 0400   BUN 13 10/14/2013 0400   CREATININE 0.53 10/14/2013 0400   CALCIUM 8.2* 10/14/2013 0400   PROT 4.7* 10/10/2013 0353   ALBUMIN 2.0* 10/10/2013 0353   AST 14 10/10/2013 0353   ALT 7 10/10/2013 0353   ALKPHOS 75 10/10/2013 0353   BILITOT 1.5* 10/10/2013 0353   GFRNONAA >90 10/14/2013 0400   GFRAA >90 10/14/2013 0400   Lipase     Component Value Date/Time   LIPASE 9*  09/10/2012 0431       Studies/Results: Dg Chest Port 1 View  10/14/2013   CLINICAL DATA:  78 year old female status post laparotomy, bowel surgery. Initial encounter.  EXAM: PORTABLE CHEST - 1 VIEW  COMPARISON:  10/12/2013 and earlier.  FINDINGS: Portable AP semi upright view at 0633 hrs. Extubated. Stable lung volumes. Stable enteric tube. Stable right IJ central line. Stable left chest cardiac pacemaker. Stable cardiac size and mediastinal contours. Continued small bilateral pleural effusions and dense retrocardiac opacity. Pulmonary vascular congestion has increased, no overt edema.  IMPRESSION: 1. Extubated.  Otherwise, stable lines and tubes. 2. Increased pulmonary vascular congestion without overt edema. 3. Stable bilateral pleural effusions and lower lobe collapse or consolidation.   Electronically Signed   By: Lars Pinks M.D.   On: 10/14/2013 07:52   Dg Abd Portable 1v  10/13/2013   CLINICAL DATA:  Abdominal discomfort.  EXAM: PORTABLE ABDOMEN - 1 VIEW  COMPARISON:  CT 10/07/2013.  FINDINGS: NG tube noted projected over stomach. The stomach is nondistended. Ostomy site left abdomen now noted. Persistent distention of the left, transverse, and right colon noted. The distention has diminished from prior exam. Stool is noted in colon. No free air. Aortoiliac atherosclerotic vascular disease.Bilateral hip replacements degenerative changes scoliosis lumbar spine.  IMPRESSION: 1. Left abdomen ostomy  site now noted. 2. Previously identified colonic distention remains but had subsided partially. 3. NG tube is noted projected over stomach.  No gastric distention.   Electronically Signed   By: Marcello Moores  Register   On: 10/13/2013 16:55    Anti-infectives: Anti-infectives   Start     Dose/Rate Route Frequency Ordered Stop   10/10/13 0600  piperacillin-tazobactam (ZOSYN) IVPB 3.375 g     3.375 g 12.5 mL/hr over 240 Minutes Intravenous Every 8 hours 10/09/13 2326     10/09/13 2330  cefoTEtan (CEFOTAN) 2 g  in dextrose 5 % 50 mL IVPB    Comments:  Send medication to OR   2 g 100 mL/hr over 30 Minutes Intravenous On call to O.R. 10/09/13 2321 10/10/13 0100   10/09/13 2330  piperacillin-tazobactam (ZOSYN) IVPB 3.375 g     3.375 g 100 mL/hr over 30 Minutes Intravenous  Once 10/09/13 2326 10/10/13 0100   10/09/13 2115  cefoTEtan (CEFOTAN) 2 g in dextrose 5 % 50 mL IVPB  Status:  Discontinued     2 g 100 mL/hr over 30 Minutes Intravenous To Surgery 10/09/13 2105 10/09/13 2321   10/09/13 1930  piperacillin-tazobactam (ZOSYN) IVPB 3.375 g  Status:  Discontinued     3.375 g 12.5 mL/hr over 240 Minutes Intravenous 3 times per day 10/09/13 1923 10/09/13 1928       Assessment/Plan 1. POD5, status post exploratory laparotomy with Hartman's procedure for colon obstruction  2. 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 COPD  4. ventilator dependent respiratory failure, status post extubation  5. Atrial fibrillation  6. Hypertension  7. Leukocytosis, resolved  Plan: 1. We will remove her NG tube today. She has bowel function in her ostomy. We will give her clear liquids. Given the amount of stool seen in her right colon on her film yesterday, I will also start her on daily MiraLAX. 2.the patient really needs to mobilize. A PT consult was placed yesterday. 3. Continue antibiotic therapy. We'll likely only need a total of 7 days from our standpoint. 4. Continue wet to dry dressing changes.     LOS: 5 days    Elman Dettman E 10/14/2013, 9:08 AM Pager: [phone]

## 2013-10-14 NOTE — Progress Notes (Signed)
I have seen and examined the pt and agree with PA-Osborne's progress note. [place] for CLD [employer]

## 2013-10-14 NOTE — Progress Notes (Signed)
[facility] ADULT ICU REPLACEMENT PROTOCOL FOR AM LAB REPLACEMENT ONLY  The patient does apply for the [facility] Adult ICU Electrolyte Replacment Protocol based on the criteria listed below:   1. Is GFR >/= 40 ml/min? Yes.    Patient's GFR today is >90 2. Is urine output >/= 0.5 ml/kg/hr for the last 6 hours? Yes.   Patient's UOP is 2.2 ml/kg/hr 3. Is BUN < 60 mg/dL? Yes.    Patient's BUN today is 13 4. Abnormal electrolyte(s):Potassium 5. Ordered repletion with: Potassium per Protocol  [name] P 10/14/2013 5:29 AM

## 2013-10-15 LAB — BASIC METABOLIC PANEL
Anion gap: 11 (ref 5–15)
BUN: 13 mg/dL (ref 6–23)
CHLORIDE: 87 meq/L — AB (ref 96–112)
CO2: 35 mEq/L — ABNORMAL HIGH (ref 19–32)
Calcium: 8.1 mg/dL — ABNORMAL LOW (ref 8.4–10.5)
Creatinine, Ser: 0.5 mg/dL (ref 0.50–1.10)
GFR calc Af Amer: >90 mL/min (ref >90)
Glucose, Bld: 79 mg/dL (ref 70–99)
Potassium: 4 mEq/L (ref 3.7–5.3)
SODIUM: 133 meq/L — AB (ref 137–147)

## 2013-10-15 LAB — MAGNESIUM: Magnesium: 1.8 mg/dL (ref 1.5–2.5)

## 2013-10-15 LAB — PHOSPHORUS: Phosphorus: 2.2 mg/dL — ABNORMAL LOW (ref 2.3–4.6)

## 2013-10-15 LAB — CBC
HCT: 31.7 % — ABNORMAL LOW (ref 36.0–46.0)
HEMOGLOBIN: 9.5 g/dL — AB (ref 12.0–15.0)
MCH: 26 pg (ref 26.0–34.0)
MCHC: 30 g/dL (ref 30.0–36.0)
MCV: 86.6 fL (ref 78.0–100.0)
PLATELETS: 248 10*3/uL (ref 150–400)
RBC: 3.66 MIL/uL — ABNORMAL LOW (ref 3.87–5.11)
RDW: 16.4 % — ABNORMAL HIGH (ref 11.5–15.5)
WBC: 9.1 10*3/uL (ref 4.0–10.5)

## 2013-10-15 LAB — GLUCOSE, CAPILLARY
GLUCOSE-CAPILLARY: 143 mg/dL — AB (ref 70–99)
Glucose-Capillary: 134 mg/dL — ABNORMAL HIGH (ref 70–99)
Glucose-Capillary: 153 mg/dL — ABNORMAL HIGH (ref 70–99)
Glucose-Capillary: 177 mg/dL — ABNORMAL HIGH (ref 70–99)
Glucose-Capillary: 214 mg/dL — ABNORMAL HIGH (ref 70–99)
Glucose-Capillary: 85 mg/dL (ref 70–99)

## 2013-10-15 MED ORDER — AMOXICILLIN-POT CLAVULANATE 875-125 MG PO TABS
1.0000 | ORAL_TABLET | Freq: Two times a day (BID) | ORAL | Status: AC
  Administered 2013-10-15 – 2013-10-16 (×4): 1 via ORAL
  Filled 2013-10-15 (×4): qty 1

## 2013-10-15 MED ORDER — PANTOPRAZOLE SODIUM 40 MG PO TBEC
40.0000 mg | DELAYED_RELEASE_TABLET | Freq: Every day | ORAL | Status: DC
  Administered 2013-10-15 – 2013-10-18 (×4): 40 mg via ORAL
  Filled 2013-10-15 (×4): qty 1

## 2013-10-15 MED ORDER — WARFARIN - PHYSICIAN DOSING INPATIENT
Freq: Every day | Status: DC
  Administered 2013-10-15 – 2013-10-16 (×2)

## 2013-10-15 MED ORDER — BOOST / RESOURCE BREEZE PO LIQD
1.0000 | Freq: Three times a day (TID) | ORAL | Status: DC
  Administered 2013-10-15 (×2): 1 via ORAL

## 2013-10-15 MED ORDER — DIGOXIN 0.25 MG/ML IJ SOLN
0.0625 mg | Freq: Every day | INTRAMUSCULAR | Status: DC
  Administered 2013-10-16: 0.0625 mg via INTRAVENOUS
  Filled 2013-10-15: qty 0.5

## 2013-10-15 MED ORDER — SODIUM CHLORIDE 0.9 % IJ SOLN: 3.0000 mL | INTRAMUSCULAR | Status: DC | PRN

## 2013-10-15 MED ORDER — METOPROLOL TARTRATE 25 MG PO TABS
25.0000 mg | ORAL_TABLET | Freq: Two times a day (BID) | ORAL | Status: DC
  Administered 2013-10-15 – 2013-10-19 (×9): 25 mg via ORAL
  Filled 2013-10-15 (×12): qty 1

## 2013-10-15 MED ORDER — TIOTROPIUM BROMIDE MONOHYDRATE 18 MCG IN CAPS
18.0000 ug | ORAL_CAPSULE | Freq: Every day | RESPIRATORY_TRACT | Status: DC
  Administered 2013-10-15 – 2013-10-18 (×4): 18 ug via RESPIRATORY_TRACT
  Filled 2013-10-15: qty 5

## 2013-10-15 MED ORDER — WARFARIN SODIUM 5 MG PO TABS
5.0000 mg | ORAL_TABLET | Freq: Once | ORAL | Status: AC
  Administered 2013-10-15: 5 mg via ORAL
  Filled 2013-10-15: qty 1

## 2013-10-15 MED ORDER — SODIUM CHLORIDE 0.9 % IJ SOLN
3.0000 mL | Freq: Two times a day (BID) | INTRAMUSCULAR | Status: DC
  Administered 2013-10-15: 3 mL

## 2013-10-15 NOTE — Progress Notes (Signed)
Patient only on full liquids and not tolerating that very well.  May need to back off on diet until ileus is more resolved.  [name]. [name], MD, [place] [phone] [facility] Surgery

## 2013-10-15 NOTE — Progress Notes (Signed)
Pt still has Public affairs consultant and is still eating.

## 2013-10-15 NOTE — Progress Notes (Signed)
Pt. Stated her grand daughter assisted her with bath.

## 2013-10-15 NOTE — Progress Notes (Signed)
ANTICOAGULATION CONSULT NOTE - Initial Consult  Pharmacy Consult for coumadin Indication: atrial fibrillation  Allergies  Allergen Reactions  . Tape Other (See Comments)    Paper tape only per family of the patient due to thinning of skin  . Cardizem [Diltiazem Hcl] Itching and Rash  . Sulfonamide Derivatives Rash    Patient Measurements: Height: 5\' 5"  (165.1 cm) Weight: 137 lb 12.6 oz (62.5 kg) IBW/kg (Calculated) : 57   Vital Signs: Temp: 97.8 F (36.6 C) (07/25 1132) Temp src: Oral (07/25 1132) BP: 103/60 mmHg (07/25 1132) Pulse Rate: 115 (07/25 1132)  Labs:  Recent Labs  10/13/13 0400 10/14/13 0400 10/15/13 0244  HGB 10.0* 9.7* 9.5*  HCT 32.5* 31.6* 31.7*  PLT 262 252 248  CREATININE 0.54 0.53 0.50    Estimated Creatinine Clearance: 54.7 ml/min (by C-G formula based on Cr of 0.5).   Medications:  Scheduled:  . amoxicillin-clavulanate  1 tablet Oral Q12H  . antiseptic oral rinse  15 mL Mouth Rinse BID  . [START ON 10/16/2013] digoxin  0.0625 mg Intravenous Daily  . diltiazem  60 mg Oral 4 times per day  . feeding supplement (RESOURCE BREEZE)  1 Container Oral TID BM  . heparin subcutaneous  5,000 Units Subcutaneous 3 times per day  . metoprolol tartrate  25 mg Oral BID  . mometasone-formoterol  2 puff Inhalation BID  . pantoprazole  40 mg Oral QHS  . polyethylene glycol  17 g Oral Daily  . tiotropium  18 mcg Inhalation QHS    Assessment: 78 yo female with afib on coumadin at home and noted s/p exploratory laparotomy with Hartman's procedure for colon obstruction> pharmacy has been consulted to resume coumadin. INR 1.39 on 7/19, patient noted on augmentin (possible drug interaction with coumadin; ends on 7/26). Patient on sq heparin.  Home coumadin dose: 5mg  MWF, 2.5mg  STTS   Goal of Therapy:  INR 2-3 Monitor platelets by anticoagulation protocol: Yes   Plan:  -Coumadin 5mg  po today -Daily PT/INR -d/c sq heparin when INR > 2.0  Hildred Laser,  Pharm D 10/15/2013 12:02 PM

## 2013-10-15 NOTE — Progress Notes (Signed)
Patient ID: [name], female   DOB: Jan 01, 1936, 78 y.o.   MRN: [id number] 6 Days Post-Op  Subjective: Patient feels well this morning. Learning clear liquids. No nausea. Minimal belching. Complains of some gas pain.  Objective: Vital signs in last 24 hours: Temp:  [97.2 F (36.2 C)-98 F (36.7 C)] 97.9 F (36.6 C) (07/25 0741) Pulse Rate:  [31-116] 95 (07/25 0800) Resp:  [12-26] 26 (07/25 0800) BP: (90-129)/(35-68) 118/53 mmHg (07/25 0800) SpO2:  [81 %-94 %] 94 % (07/25 0800) Arterial Line BP: (81-143)/(51-69) 112/53 mmHg (07/24 1400) FiO2 (%):  [40 %] 40 % (07/25 0741) Weight:  [132 lb 15 oz (60.3 kg)-137 lb 12.6 oz (62.5 kg)] 137 lb 12.6 oz (62.5 kg) (07/25 0356) Last BM Date: 10/14/13 (colostomy with liquid stool)  Intake/Output from previous day: 07/24 0701 - 07/25 0700 In: 690 [P.O.:480; I.V.:110; IV Piggyback:100] Out: 2750 [Urine:2700; Emesis/NG output:50] Intake/Output this shift:    PE: Abd: Soft, nondistended, appropriately tender, stoma is less edematous. Not much in bag right now, but just emptied. Midline wound is clean and packed.  Lab Results:   Recent Labs  10/14/13 0400 10/15/13 0244  WBC 9.5 9.1  HGB 9.7* 9.5*  HCT 31.6* 31.7*  PLT 252 248   BMET  Recent Labs  10/14/13 0400 10/15/13 0244  NA 131* 133*  K 3.3* 4.0  CL 85* 87*  CO2 36* 35*  GLUCOSE 85 79  BUN 13 13  CREATININE 0.53 0.50  CALCIUM 8.2* 8.1*   PT/INR No results found for this basename: LABPROT, INR,  in the last 72 hours CMP     Component Value Date/Time   NA 133* 10/15/2013 0244   K 4.0 10/15/2013 0244   CL 87* 10/15/2013 0244   CO2 35* 10/15/2013 0244   GLUCOSE 79 10/15/2013 0244   BUN 13 10/15/2013 0244   CREATININE 0.50 10/15/2013 0244   CALCIUM 8.1* 10/15/2013 0244   PROT 4.7* 10/10/2013 0353   ALBUMIN 2.0* 10/10/2013 0353   AST 14 10/10/2013 0353   ALT 7 10/10/2013 0353   ALKPHOS 75 10/10/2013 0353   BILITOT 1.5* 10/10/2013 0353   GFRNONAA >90 10/15/2013 0244   GFRAA >90 10/15/2013 0244   Lipase     Component Value Date/Time   LIPASE 9* 09/10/2012 0431       Studies/Results: Dg Chest Port 1 View  10/14/2013   CLINICAL DATA:  78 year old female status post laparotomy, bowel surgery. Initial encounter.  EXAM: PORTABLE CHEST - 1 VIEW  COMPARISON:  10/12/2013 and earlier.  FINDINGS: Portable AP semi upright view at 0633 hrs. Extubated. Stable lung volumes. Stable enteric tube. Stable right IJ central line. Stable left chest cardiac pacemaker. Stable cardiac size and mediastinal contours. Continued small bilateral pleural effusions and dense retrocardiac opacity. Pulmonary vascular congestion has increased, no overt edema.  IMPRESSION: 1. Extubated.  Otherwise, stable lines and tubes. 2. Increased pulmonary vascular congestion without overt edema. 3. Stable bilateral pleural effusions and lower lobe collapse or consolidation.   Electronically Signed   By: Lars Pinks M.D.   On: 10/14/2013 07:52   Dg Abd Portable 1v  10/13/2013   CLINICAL DATA:  Abdominal discomfort.  EXAM: PORTABLE ABDOMEN - 1 VIEW  COMPARISON:  CT 10/07/2013.  FINDINGS: NG tube noted projected over stomach. The stomach is nondistended. Ostomy site left abdomen now noted. Persistent distention of the left, transverse, and right colon noted. The distention has diminished from prior exam. Stool is noted in colon. No free  air. Aortoiliac atherosclerotic vascular disease.Bilateral hip replacements degenerative changes scoliosis lumbar spine.  IMPRESSION: 1. Left abdomen ostomy site now noted. 2. Previously identified colonic distention remains but had subsided partially. 3. NG tube is noted projected over stomach.  No gastric distention.   Electronically Signed   By: Marcello Moores  Register   On: 10/13/2013 16:55    Anti-infectives: Anti-infectives   Start     Dose/Rate Route Frequency Ordered Stop   10/15/13 1000  amoxicillin-clavulanate (AUGMENTIN) 875-125 MG per tablet 1 tablet     1 tablet Oral  Every 12 hours 10/15/13 0809 10/17/13 0959   10/10/13 0600  piperacillin-tazobactam (ZOSYN) IVPB 3.375 g     3.375 g 12.5 mL/hr over 240 Minutes Intravenous Every 8 hours 10/09/13 2326     10/09/13 2330  cefoTEtan (CEFOTAN) 2 g in dextrose 5 % 50 mL IVPB    Comments:  Send medication to OR   2 g 100 mL/hr over 30 Minutes Intravenous On call to O.R. 10/09/13 2321 10/10/13 0100   10/09/13 2330  piperacillin-tazobactam (ZOSYN) IVPB 3.375 g     3.375 g 100 mL/hr over 30 Minutes Intravenous  Once 10/09/13 2326 10/10/13 0100   10/09/13 2115  cefoTEtan (CEFOTAN) 2 g in dextrose 5 % 50 mL IVPB  Status:  Discontinued     2 g 100 mL/hr over 30 Minutes Intravenous To Surgery 10/09/13 2105 10/09/13 2321   10/09/13 1930  piperacillin-tazobactam (ZOSYN) IVPB 3.375 g  Status:  Discontinued     3.375 g 12.5 mL/hr over 240 Minutes Intravenous 3 times per day 10/09/13 1923 10/09/13 1928       Assessment/Plan  1. POD6, status post exploratory laparotomy with Hartman's procedure for colon obstruction  2. 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 COPD  4. ventilator dependent respiratory failure, status post extubation  5. Atrial fibrillation  6. Hypertension  7. Leukocytosis, resolved   Plan: 1. We'll advance her diet to full liquids today. I've resource Breas for additional calories. Continue daily MiraLAX. 2. Continue physical therapy and mobilization at least 2-3 times a day. 3. Zosyn has been discontinued after 5 days. I will start oral Augmentin today. Today is day 6/7 of total antibiotic course needed for surgery. 4. Continue dressing changes to abdomen twice a day 5. May resume anticoagulants from surgery standpoint whenever felt necessary by medicine.  LOS: 6 days    Shima Compere E 10/15/2013, 8:10 AM Pager: [phone]

## 2013-10-15 NOTE — Progress Notes (Signed)
[place] TEAM 1 - Stepdown/ICU TEAM Progress Note  [name] JOA:[id number] DOB: 1936/02/18 DOA: 10/09/2013 PCP: Glenda Chroman., MD  Admit HPI / Brief Narrative: 78 y/o F with history of COPD, afib on coumadin, and several weeks of severe constipation who progressed to large bowel obstruction and was taken to OR on 7/19 for ex lap and colostomy.   Significant events:  Ex Lap 10/09/13 with Hartman's procedure for colon obstruction  ETT 7/19>>>7/22  HPI/Subjective: In good spirits.  C/o some modest abdom pain.  Tolerating liquid diet thus far.  No cp, sob, f/c, or n/v at this time.    Assessment/Plan:  Bowel obstruction S/p Ex Lap and colonic resection w/ colostomy for bowel obstruction and microperforation - continue ax for a total of 7 days per surgeries recommendations - care per Gen Surg  Essential hypertension, benign  BP currently well controlled - follow trend  Permanent atrial fibrillation w/ RVR On chronic coumadin tx - cleared per Surg to resume anticoag - will restart warfarin w/o bridge today   Hx of SSS s/p pacer 2004  Severe COPD w/ failure to Liberate from Mechanical Ventilation on 2-3L of oxygen at home - wean O2 as able - resp status stable since extubation 7/22  Mild hyponatremia  improving  Code Status: FULL Family Communication: spoke w/ granddaughter at bedside Disposition Plan: SDU   Consultants: Gen Surgery PCCM >> TRH   Antibiotics: Cefotetan 7/19  Pip-tazo 7/19>>7/24 Augmentin 7/25 >>  DVT prophylaxis: SQ heparin + warfarin resumption   Objective: Blood pressure 118/53, pulse 95, temperature 97.9 F (36.6 C), temperature source Oral, resp. rate 26, height 5\' 5"  (1.651 m), weight 62.5 kg (137 lb 12.6 oz), SpO2 93.00%.  Intake/Output Summary (Last 24 hours) at 10/15/13 1027 Last data filed at 10/15/13 0401  Gross per 24 hour  Intake    370 ml  Output   2300 ml  Net  -1930 ml   Exam: General: No acute respiratory  distress Lungs: Clear to auscultation bilaterally without wheezes or crackles Cardiovascular: Regular rate and rhythm without murmur gallop or rub normal S1 and S2 Abdomen: Mildly tender diffusely, nondistended, soft, no rebound, no ascites, no appreciable mass - L sided ostomy unremarkable - midline wound dressed and dry  Extremities: No significant cyanosis, clubbing, or edema bilateral lower extremities  Data Reviewed: Basic Metabolic Panel:  Recent Labs Lab 10/11/13 0410 10/12/13 0403 10/13/13 0400 10/14/13 0400 10/15/13 0244  NA 134* 134* 132* 131* 133*  K 3.8 3.2* 3.0* 3.3* 4.0  CL 91* 88* 84* 85* 87*  CO2 27 34* 36* 36* 35*  GLUCOSE 149* 112* 88 85 79  BUN 10 11 10 13 13   CREATININE 0.67 0.58 0.54 0.53 0.50  CALCIUM 7.4* 7.6* 7.8* 8.2* 8.1*  MG 1.7 1.6 1.9 1.8 1.8  PHOS 3.2 2.6 3.3 2.8 2.2*    Liver Function Tests:  Recent Labs Lab 10/09/13 1900 10/10/13 0353  AST 13 14  ALT 7 7  ALKPHOS 107 75  BILITOT 0.8 1.5*  PROT 5.3* 4.7*  ALBUMIN 2.1* 2.0*   Coags:  Recent Labs Lab 10/09/13 1900  INR 1.39   No results found for this basename: PTT,  in the last 168 hours  CBC:  Recent Labs Lab 10/11/13 0410 10/12/13 0403 10/13/13 0400 10/14/13 0400 10/15/13 0244  WBC 19.6* 13.8* 11.6* 9.5 9.1  HGB 10.9* 9.8* 10.0* 9.7* 9.5*  HCT 34.9* 31.2* 32.5* 31.6* 31.7*  MCV 85.1 82.5 84.2 86.6 86.6  PLT  285 266 262 252 248   CBG:  Recent Labs Lab 10/14/13 1723 10/14/13 1945 10/15/13 0006 10/15/13 0354 10/15/13 0744  GLUCAP 137* 101* 134* 85 143*    Recent Results (from the past 240 hour(s))  SURGICAL PCR SCREEN     Status: None   Collection Time    10/09/13  8:50 PM      Result Value Ref Range Status   MRSA, PCR NEGATIVE  NEGATIVE Final   Staphylococcus aureus NEGATIVE  NEGATIVE Final   Comment:            The Xpert SA Assay (FDA     approved for NASAL specimens     in patients over 55 years of age),     is one component of     a comprehensive  surveillance     program.  Test performance has     been validated by [employer] for patients greater     than or equal to 10 year old.     It is not intended     to diagnose infection nor to     guide or monitor treatment.     Studies:  Recent x-ray studies have been reviewed in detail by the Attending Physician  Scheduled Meds:  Scheduled Meds: . amoxicillin-clavulanate  1 tablet Oral Q12H  . antiseptic oral rinse  15 mL Mouth Rinse BID  . digoxin  0.125 mg Intravenous Daily  . diltiazem  60 mg Oral 4 times per day  . feeding supplement (RESOURCE BREEZE)  1 Container Oral TID BM  . heparin subcutaneous  5,000 Units Subcutaneous 3 times per day  . metoprolol  2.5 mg Intravenous 4 times per day  . mometasone-formoterol  2 puff Inhalation BID  . pantoprazole  40 mg Oral QHS  . piperacillin-tazobactam (ZOSYN)  IV  3.375 g Intravenous Q8H  . polyethylene glycol  17 g Oral Daily  . sodium chloride  3 mL Intracatheter Q12H    Time spent on care of this patient: 35 mins   Dagmawi Venable T , MD   [facility] Office  [phone] Pager - Text Page per Shea Evans as per below:  On-Call/Text Page:      [email or web address]      password [email or web address]  If 7PM-7AM, please contact night-coverage [email or web address] Password [facility] 10/15/2013, 10:27 AM   LOS: 6 days

## 2013-10-15 NOTE — Progress Notes (Signed)
Tech rechecked to see if Pt. Was ready for bath. Pt. is asleep.

## 2013-10-16 LAB — COMPREHENSIVE METABOLIC PANEL
ALK PHOS: 80 U/L (ref 39–117)
ALT: 11 U/L (ref 0–35)
ANION GAP: 10 (ref 5–15)
AST: 14 U/L (ref 0–37)
Albumin: 2.1 g/dL — ABNORMAL LOW (ref 3.5–5.2)
BILIRUBIN TOTAL: 0.6 mg/dL (ref 0.3–1.2)
BUN: 11 mg/dL (ref 6–23)
CO2: 37 meq/L — AB (ref 19–32)
Calcium: 8.6 mg/dL (ref 8.4–10.5)
Chloride: 88 mEq/L — ABNORMAL LOW (ref 96–112)
Creatinine, Ser: 0.43 mg/dL — ABNORMAL LOW (ref 0.50–1.10)
GLUCOSE: 117 mg/dL — AB (ref 70–99)
POTASSIUM: 3.7 meq/L (ref 3.7–5.3)
Sodium: 135 mEq/L — ABNORMAL LOW (ref 137–147)
TOTAL PROTEIN: 5.4 g/dL — AB (ref 6.0–8.3)

## 2013-10-16 LAB — CBC
HCT: 32.2 % — ABNORMAL LOW (ref 36.0–46.0)
Hemoglobin: 9.6 g/dL — ABNORMAL LOW (ref 12.0–15.0)
MCH: 26.2 pg (ref 26.0–34.0)
MCHC: 29.8 g/dL — ABNORMAL LOW (ref 30.0–36.0)
MCV: 87.7 fL (ref 78.0–100.0)
PLATELETS: 271 10*3/uL (ref 150–400)
RBC: 3.67 MIL/uL — ABNORMAL LOW (ref 3.87–5.11)
RDW: 16.8 % — ABNORMAL HIGH (ref 11.5–15.5)
WBC: 9.1 10*3/uL (ref 4.0–10.5)

## 2013-10-16 LAB — PROTIME-INR
INR: 0.93 (ref 0.00–1.49)
Prothrombin Time: 12.5 seconds (ref 11.6–15.2)

## 2013-10-16 LAB — GLUCOSE, CAPILLARY
Glucose-Capillary: 134 mg/dL — ABNORMAL HIGH (ref 70–99)
Glucose-Capillary: 160 mg/dL — ABNORMAL HIGH (ref 70–99)

## 2013-10-16 MED ORDER — HYDROCODONE-ACETAMINOPHEN 5-325 MG PO TABS
1.0000 | ORAL_TABLET | ORAL | Status: DC | PRN
  Administered 2013-10-18: 1 via ORAL
  Filled 2013-10-16: qty 1

## 2013-10-16 MED ORDER — ENSURE COMPLETE PO LIQD
237.0000 mL | Freq: Two times a day (BID) | ORAL | Status: DC
  Administered 2013-10-16 – 2013-10-18 (×5): 237 mL via ORAL

## 2013-10-16 MED ORDER — DIGOXIN 0.0625 MG HALF TABLET
0.0625 mg | ORAL_TABLET | Freq: Every day | ORAL | Status: DC
  Administered 2013-10-17 – 2013-10-19 (×3): 0.0625 mg via ORAL
  Filled 2013-10-16 (×3): qty 1

## 2013-10-16 MED ORDER — OXYCODONE HCL 5 MG PO TABS
5.0000 mg | ORAL_TABLET | ORAL | Status: DC | PRN
  Administered 2013-10-16 – 2013-10-19 (×6): 5 mg via ORAL
  Filled 2013-10-16 (×6): qty 1

## 2013-10-16 MED ORDER — WARFARIN SODIUM 5 MG PO TABS
5.0000 mg | ORAL_TABLET | Freq: Once | ORAL | Status: AC
  Administered 2013-10-16: 5 mg via ORAL
  Filled 2013-10-16: qty 1

## 2013-10-16 MED ORDER — VERAPAMIL HCL ER 120 MG PO TBCR
120.0000 mg | EXTENDED_RELEASE_TABLET | Freq: Two times a day (BID) | ORAL | Status: DC
  Administered 2013-10-17 – 2013-10-19 (×5): 120 mg via ORAL
  Filled 2013-10-16 (×7): qty 1

## 2013-10-16 MED ORDER — ALPRAZOLAM 0.25 MG PO TABS
0.2500 mg | ORAL_TABLET | Freq: Two times a day (BID) | ORAL | Status: DC | PRN
  Administered 2013-10-16 – 2013-10-18 (×3): 0.25 mg via ORAL
  Filled 2013-10-16 (×3): qty 1

## 2013-10-16 MED ORDER — HYDROCODONE-ACETAMINOPHEN 5-325 MG PO TABS
1.0000 | ORAL_TABLET | Freq: Four times a day (QID) | ORAL | Status: DC | PRN
  Administered 2013-10-16: 1 via ORAL
  Filled 2013-10-16: qty 1

## 2013-10-16 NOTE — Progress Notes (Signed)
Patient ID: [name], female   DOB: January 09, 1936, 78 y.o.   MRN: [id number] 7 Days Post-Op  Subjective: Patient feels well this morning. She has no complaints. She is tolerating full liquids well. She did not care for the resource breeze. She did mobilize in the hall yesterday. Her pain is well-controlled.  Objective: Vital signs in last 24 hours: Temp:  [97.3 F (36.3 C)-98 F (36.7 C)] 97.3 F (36.3 C) (07/26 0351) Pulse Rate:  [83-118] 84 (07/26 0351) Resp:  [14-30] 14 (07/26 0351) BP: (101-121)/(39-64) 101/49 mmHg (07/26 0351) SpO2:  [90 %-98 %] 95 % (07/26 0351) FiO2 (%):  [40 %] 40 % (07/25 1132) Weight:  [134 lb 4.2 oz (60.9 kg)] 134 lb 4.2 oz (60.9 kg) (07/26 0351) Last BM Date: 10/16/13 (colostomy)  Intake/Output from previous day: 07/25 0701 - 07/26 0700 In: 480 [P.O.:480] Out: 250 [Urine:150; Stool:100] Intake/Output this shift: Total I/O In: -  Out: 200 [Urine:150; Stool:50]  PE: Abd: Soft, less tender, nondistended, wound is clean and packed. Her ostomy has air and liquid stool present. Her stoma is viable.  Lab Results:   Recent Labs  10/15/13 0244 10/16/13 0344  WBC 9.1 9.1  HGB 9.5* 9.6*  HCT 31.7* 32.2*  PLT 248 271   BMET  Recent Labs  10/15/13 0244 10/16/13 0344  NA 133* 135*  K 4.0 3.7  CL 87* 88*  CO2 35* 37*  GLUCOSE 79 117*  BUN 13 11  CREATININE 0.50 0.43*  CALCIUM 8.1* 8.6   PT/INR  Recent Labs  10/16/13 0344  LABPROT 12.5  INR 0.93   CMP     Component Value Date/Time   NA 135* 10/16/2013 0344   K 3.7 10/16/2013 0344   CL 88* 10/16/2013 0344   CO2 37* 10/16/2013 0344   GLUCOSE 117* 10/16/2013 0344   BUN 11 10/16/2013 0344   CREATININE 0.43* 10/16/2013 0344   CALCIUM 8.6 10/16/2013 0344   PROT 5.4* 10/16/2013 0344   ALBUMIN 2.1* 10/16/2013 0344   AST 14 10/16/2013 0344   ALT 11 10/16/2013 0344   ALKPHOS 80 10/16/2013 0344   BILITOT 0.6 10/16/2013 0344   GFRNONAA >90 10/16/2013 0344   GFRAA >90 10/16/2013 0344   Lipase      Component Value Date/Time   LIPASE 9* 09/10/2012 0431       Studies/Results: No results found.  Anti-infectives: Anti-infectives   Start     Dose/Rate Route Frequency Ordered Stop   10/15/13 1000  amoxicillin-clavulanate (AUGMENTIN) 875-125 MG per tablet 1 tablet     1 tablet Oral Every 12 hours 10/15/13 0809 10/17/13 0959   10/10/13 0600  piperacillin-tazobactam (ZOSYN) IVPB 3.375 g  Status:  Discontinued     3.375 g 12.5 mL/hr over 240 Minutes Intravenous Every 8 hours 10/09/13 2326 10/15/13 1142   10/09/13 2330  cefoTEtan (CEFOTAN) 2 g in dextrose 5 % 50 mL IVPB    Comments:  Send medication to OR   2 g 100 mL/hr over 30 Minutes Intravenous On call to O.R. 10/09/13 2321 10/10/13 0100   10/09/13 2330  piperacillin-tazobactam (ZOSYN) IVPB 3.375 g     3.375 g 100 mL/hr over 30 Minutes Intravenous  Once 10/09/13 2326 10/10/13 0100   10/09/13 2115  cefoTEtan (CEFOTAN) 2 g in dextrose 5 % 50 mL IVPB  Status:  Discontinued     2 g 100 mL/hr over 30 Minutes Intravenous To Surgery 10/09/13 2105 10/09/13 2321   10/09/13 1930  piperacillin-tazobactam (ZOSYN) IVPB  3.375 g  Status:  Discontinued     3.375 g 12.5 mL/hr over 240 Minutes Intravenous 3 times per day 10/09/13 1923 10/09/13 1928       Assessment/Plan   1. POD7, status post exploratory laparotomy with Hartman's procedure for colon obstruction  2. 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 COPD  4. ventilator dependent respiratory failure, status post extubation  5. Atrial fibrillation  6. Hypertension  7. Leukocytosis, resolved  Plan: 1. Advanced diet to heart healthy. I have changed her feeding supplement to Ensure. 2. Continue mobilization. 3. We'll need to arrange home health for ostomy care and wound care if this is her disposition. 4. Today is her final day of antibiotic therapy from a surgical standpoint. There is a stop time for her Augmentin. 5. Continue twice a day dressing  changes.   LOS: 7 days    Keena Heesch E 10/16/2013, 8:05 AM Pager: [phone]

## 2013-10-16 NOTE — Progress Notes (Signed)
ANTICOAGULATION CONSULT NOTE - Follow Up Consult  Pharmacy Consult for coumadin Indication: atrial fibrillation  Allergies  Allergen Reactions  . Tape Other (See Comments)    Paper tape only per family of the patient due to thinning of skin  . Cardizem [Diltiazem Hcl] Itching and Rash  . Sulfonamide Derivatives Rash    Patient Measurements: Height: 5\' 5"  (165.1 cm) Weight: 134 lb 4.2 oz (60.9 kg) IBW/kg (Calculated) : 57  Vital Signs: Temp: 97.3 F (36.3 C) (07/26 0351) Temp src: Oral (07/26 0351) BP: 101/49 mmHg (07/26 0351) Pulse Rate: 84 (07/26 0351)  Labs:  Recent Labs  10/14/13 0400 10/15/13 0244 10/16/13 0344  HGB 9.7* 9.5* 9.6*  HCT 31.6* 31.7* 32.2*  PLT 252 248 271  LABPROT  --   --  12.5  INR  --   --  0.93  CREATININE 0.53 0.50 0.43*    Estimated Creatinine Clearance: 54.7 ml/min (by C-G formula based on Cr of 0.43).   Medications:  Scheduled:  . amoxicillin-clavulanate  1 tablet Oral Q12H  . antiseptic oral rinse  15 mL Mouth Rinse BID  . digoxin  0.0625 mg Intravenous Daily  . diltiazem  60 mg Oral 4 times per day  . feeding supplement (RESOURCE BREEZE)  1 Container Oral TID BM  . heparin subcutaneous  5,000 Units Subcutaneous 3 times per day  . metoprolol tartrate  25 mg Oral BID  . mometasone-formoterol  2 puff Inhalation BID  . pantoprazole  40 mg Oral QHS  . polyethylene glycol  17 g Oral Daily  . tiotropium  18 mcg Inhalation QHS  . Warfarin - Physician Dosing Inpatient   Does not apply q1800    Assessment: 78 yo female with afib on coumadin at home and noted s/p exploratory laparotomy with Hartman's procedure for colon obstruction.  Pharmacy has been consulted to resume coumadin. INR today= 0.93 (coumadin restarted 7/25).  Goal of Therapy:  Heparin level 0.3-0.7 units/ml Monitor platelets by anticoagulation protocol: Yes   Plan:  -Coumadin 5mg  po today -Daily PT/INR  Hildred Laser, Pharm D 10/16/2013 7:57 AM

## 2013-10-16 NOTE — Progress Notes (Signed)
Advance diet  Physical therapy Continue dressing changes   Imogene Burn. Georgette Dover, MD, [facility] Surgery  General/ Trauma Surgery  10/16/2013 9:28 AM

## 2013-10-16 NOTE — Progress Notes (Signed)
[place] TEAM 1 - Stepdown/ICU TEAM Progress Note  [name] WJX:[id number] DOB: [birth year] DOA: 10/09/2013 PCP: Glenda Chroman., MD  Admit HPI / Brief Narrative: 78 y/o F with history of COPD, afib on coumadin, and several weeks of severe constipation who progressed to large bowel obstruction and was taken to OR on 7/19 for ex lap and colostomy.   Significant events:  Ex Lap 10/09/13 with Hartman's procedure for colon obstruction  ETT 7/19>>>7/22  HPI/Subjective: Pt c/o some modest nausea this morning, but no vomiting.  She denies cp, sob, or abdom pain.    Assessment/Plan:  Bowel obstruction S/p Ex Lap and colonic resection w/ colostomy for bowel obstruction and microperforation - continue abx for a total of 7 days per surgeries recommendations (to end today) - care per Gen Surg - advancing to regular diet today   Essential hypertension, benign  BP currently well controlled - follow trend  Permanent atrial fibrillation w/ RVR On chronic coumadin tx - cleared per Surg to resume anticoag - restarted warfarin w/o bridge 7/25  Hx of SSS s/p pacer 2004  Severe COPD w/ failure to Liberate from Mechanical Ventilation on 2-3L of oxygen at home - weaned O2 to home dose - resp status stable since extubation 7/22  Mild hyponatremia  Improving/nearly resolved  Code Status: FULL Family Communication: spoke w/ daughter at bedside Disposition Plan: stable for transfer to med/surg bed    Consultants: Gen Surgery PCCM >> TRH   Antibiotics: Cefotetan 7/19  Pip-tazo 7/19>>7/24 Augmentin 7/25 >>7/26  DVT prophylaxis: SQ heparin + warfarin    Objective: Blood pressure 107/49, pulse 98, temperature 98.1 F (36.7 C), temperature source Oral, resp. rate 20, height 5\' 5"  (1.651 m), weight 60.9 kg (134 lb 4.2 oz), SpO2 92.00%.  Intake/Output Summary (Last 24 hours) at 10/16/13 1228 Last data filed at 10/16/13 0930  Gross per 24 hour  Intake    240 ml  Output    700 ml  Net    -460 ml   Exam: General: No acute respiratory distress Lungs: Clear to auscultation bilaterally without wheezes or crackles Cardiovascular: Regular rate and rhythm without murmur gallop or rub  Abdomen: Mildly tender diffusely, nondistended, soft, no rebound, no ascites, no appreciable mass - L sided ostomy unremarkable - midline wound dressed and dry  Extremities: No significant cyanosis, clubbing, or edema bilateral lower extremities  Data Reviewed: Basic Metabolic Panel:  Recent Labs Lab 10/11/13 0410 10/12/13 0403 10/13/13 0400 10/14/13 0400 10/15/13 0244 10/16/13 0344  NA 134* 134* 132* 131* 133* 135*  K 3.8 3.2* 3.0* 3.3* 4.0 3.7  CL 91* 88* 84* 85* 87* 88*  CO2 27 34* 36* 36* 35* 37*  GLUCOSE 149* 112* 88 85 79 117*  BUN 10 11 10 13 13 11   CREATININE 0.67 0.58 0.54 0.53 0.50 0.43*  CALCIUM 7.4* 7.6* 7.8* 8.2* 8.1* 8.6  MG 1.7 1.6 1.9 1.8 1.8  --   PHOS 3.2 2.6 3.3 2.8 2.2*  --     Liver Function Tests:  Recent Labs Lab 10/09/13 1900 10/10/13 0353 10/16/13 0344  AST 13 14 14   ALT 7 7 11   ALKPHOS 107 75 80  BILITOT 0.8 1.5* 0.6  PROT 5.3* 4.7* 5.4*  ALBUMIN 2.1* 2.0* 2.1*   Coags:  Recent Labs Lab 10/09/13 1900 10/16/13 0344  INR 1.39 0.93   No results found for this basename: PTT,  in the last 168 hours  CBC:  Recent Labs Lab 10/12/13 0403 10/13/13 0400 10/14/13  0400 10/15/13 0244 10/16/13 0344  WBC 13.8* 11.6* 9.5 9.1 9.1  HGB 9.8* 10.0* 9.7* 9.5* 9.6*  HCT 31.2* 32.5* 31.6* 31.7* 32.2*  MCV 82.5 84.2 86.6 86.6 87.7  PLT 266 262 252 248 271   CBG:  Recent Labs Lab 10/15/13 1131 10/15/13 1608 10/15/13 2006 10/15/13 2357 10/16/13 0349  GLUCAP 177* 153* 214* 160* 134*    Recent Results (from the past 240 hour(s))  SURGICAL PCR SCREEN     Status: None   Collection Time    10/09/13  8:50 PM      Result Value Ref Range Status   MRSA, PCR NEGATIVE  NEGATIVE Final   Staphylococcus aureus NEGATIVE  NEGATIVE Final   Comment:             The Xpert SA Assay (FDA     approved for NASAL specimens     in patients over 48 years of age),     is one component of     a comprehensive surveillance     program.  Test performance has     been validated by [employer] for patients greater     than or equal to 76 year old.     It is not intended     to diagnose infection nor to     guide or monitor treatment.     Studies:  Recent x-ray studies have been reviewed in detail by the Attending Physician  Scheduled Meds:  Scheduled Meds: . amoxicillin-clavulanate  1 tablet Oral Q12H  . antiseptic oral rinse  15 mL Mouth Rinse BID  . digoxin  0.0625 mg Intravenous Daily  . diltiazem  60 mg Oral 4 times per day  . feeding supplement (ENSURE COMPLETE)  237 mL Oral BID BM  . heparin subcutaneous  5,000 Units Subcutaneous 3 times per day  . metoprolol tartrate  25 mg Oral BID  . mometasone-formoterol  2 puff Inhalation BID  . pantoprazole  40 mg Oral QHS  . polyethylene glycol  17 g Oral Daily  . tiotropium  18 mcg Inhalation QHS  . warfarin  5 mg Oral ONCE-1800  . Warfarin - Physician Dosing Inpatient   Does not apply q1800    Time spent on care of this patient: 35 mins   [facility] T , MD   [facility] Office  [phone] Pager - Text Page per Amion as per below:  On-Call/Text Page:      [email or web address]      password [email or web address]  If 7PM-7AM, please contact night-coverage [email or web address] Password [email or web address] 10/16/2013, 12:28 PM   LOS: 7 days

## 2013-10-17 DIAGNOSIS — Z7901 Long term (current) use of anticoagulants: Secondary | ICD-10-CM

## 2013-10-17 LAB — CBC
HCT: 33.2 % — ABNORMAL LOW (ref 36.0–46.0)
Hemoglobin: 10.1 g/dL — ABNORMAL LOW (ref 12.0–15.0)
MCH: 27 pg (ref 26.0–34.0)
MCHC: 30.4 g/dL (ref 30.0–36.0)
MCV: 88.8 fL (ref 78.0–100.0)
PLATELETS: 283 10*3/uL (ref 150–400)
RBC: 3.74 MIL/uL — AB (ref 3.87–5.11)
RDW: 17 % — AB (ref 11.5–15.5)
WBC: 6.3 10*3/uL (ref 4.0–10.5)

## 2013-10-17 LAB — PROTIME-INR
INR: 1 (ref 0.00–1.49)
Prothrombin Time: 13.2 seconds (ref 11.6–15.2)

## 2013-10-17 LAB — COMPREHENSIVE METABOLIC PANEL
ALT: 11 U/L (ref 0–35)
ANION GAP: 8 (ref 5–15)
AST: 16 U/L (ref 0–37)
Albumin: 2.1 g/dL — ABNORMAL LOW (ref 3.5–5.2)
Alkaline Phosphatase: 79 U/L (ref 39–117)
BILIRUBIN TOTAL: 0.6 mg/dL (ref 0.3–1.2)
BUN: 8 mg/dL (ref 6–23)
CALCIUM: 8.5 mg/dL (ref 8.4–10.5)
CO2: 35 meq/L — AB (ref 19–32)
CREATININE: 0.47 mg/dL — AB (ref 0.50–1.10)
Chloride: 90 mEq/L — ABNORMAL LOW (ref 96–112)
Glucose, Bld: 100 mg/dL — ABNORMAL HIGH (ref 70–99)
Potassium: 3.4 mEq/L — ABNORMAL LOW (ref 3.7–5.3)
Sodium: 133 mEq/L — ABNORMAL LOW (ref 137–147)
Total Protein: 5.2 g/dL — ABNORMAL LOW (ref 6.0–8.3)

## 2013-10-17 MED ORDER — POTASSIUM CHLORIDE CRYS ER 20 MEQ PO TBCR
40.0000 meq | EXTENDED_RELEASE_TABLET | Freq: Once | ORAL | Status: AC
  Administered 2013-10-17: 40 meq via ORAL
  Filled 2013-10-17: qty 2

## 2013-10-17 MED ORDER — WARFARIN - PHARMACIST DOSING INPATIENT
Freq: Every day | Status: DC
  Administered 2013-10-17 – 2013-10-18 (×2)

## 2013-10-17 MED ORDER — WARFARIN SODIUM 5 MG PO TABS
5.0000 mg | ORAL_TABLET | Freq: Once | ORAL | Status: AC
  Administered 2013-10-17: 5 mg via ORAL
  Filled 2013-10-17: qty 1

## 2013-10-17 NOTE — Evaluation (Signed)
Occupational Therapy Evaluation Patient Details Name: [name] MRN: [id number] DOB: [birth year] Today's Date: 10/17/2013    History of Present Illness Pt adm with large bowel obstruction and underwent exploratory lap and colostomy on 7/19. Pt with recent (May 2015) lt hip fx with hemiarthroplasty, severe COPD   Clinical Impression   Pt admitted with above. Pt independent prior to last hip surgery. Feel pt will benefit from acute OT to increase independence prior to d/c.     Follow Up Recommendations  No OT follow up;Supervision - Intermittent    Equipment Recommendations  None recommended by OT    Recommendations for Other Services       Precautions / Restrictions Precautions Precautions: Fall;Posterior Hip (questionable hip precautions-surgery in May) Precaution Comments: questionable hip precautions? pt reports surgery on left hip; chart says right. Restrictions Weight Bearing Restrictions: No      Mobility Bed Mobility Overal bed mobility: Needs Assistance Bed Mobility: Supine to Sit     Supine to sit: Mod assist     General bed mobility comments: Cues for technique. Assistance to scoot Hips out and for trunk.  Transfers Overall transfer level: Needs assistance Equipment used: Rolling walker (2 wheeled) Transfers: Sit to/from [facility] Sit to Stand: Mod assist;Min guard Stand pivot transfers: Min guard       General transfer comment: Mod A from bed; Min guard for sit to stand from [facility].    Balance                                            ADL Overall ADL's : Needs assistance/impaired     Grooming: Wash/dry hands;Sitting               Lower Body Dressing: Moderate assistance;With adaptive equipment;Sit to/from stand   Toilet Transfer: Min guard;Ambulation;RW (chair)   Toileting- Clothing Manipulation and Hygiene: Sit to/from stand;Min guard       Functional mobility during ADLs: Min  guard;Rolling walker General ADL Comments: Educated on tub transfer technique (backing up and swinging legs in). Educated on dressing technique. Pt practiced with reacher/sockaid donning/doffing sock. Educated on safety tips for home (rugs, safe shoewear, use of bag on walker).     Vision                     Perception     Praxis      Pertinent Vitals/Pain Pain 7/10. Repositioned.      Hand Dominance     Extremity/Trunk Assessment Upper Extremity Assessment Upper Extremity Assessment: Overall WFL for tasks assessed   Lower Extremity Assessment Lower Extremity Assessment: Defer to PT evaluation       Communication Communication Communication: No difficulties   Cognition Arousal/Alertness: Awake/alert Behavior During Therapy: WFL for tasks assessed/performed Overall Cognitive Status: Within Functional Limits for tasks assessed                     General Comments       Exercises       Shoulder Instructions      Home Living Family/patient expects to be discharged to:: Private residence Living Arrangements: Children Available Help at Discharge: Family;Available 24 hours/day;Friend(s) Type of Home: House Home Access: Stairs to enter [employer] of Steps: 3 Entrance Stairs-Rails: Right Home Layout: One level     Bathroom Shower/Tub: Tub/shower unit   [employer]  Toilet: Standard     Home Equipment: Walker - 2 wheels;Wheelchair - manual;Bedside commode;Cane - single point;Crutches;Other (comment);Walker - 4 wheels;Cane - quad (O2)          Prior Functioning/Environment Level of Independence: Independent with assistive device(s)             OT Diagnosis: Acute pain;Generalized weakness   OT Problem List: Decreased strength;Decreased activity tolerance;Decreased knowledge of use of DME or AE;Decreased knowledge of precautions;Pain;Cardiopulmonary status limiting activity   OT Treatment/Interventions: Self-care/ADL training;DME  and/or AE instruction;Therapeutic activities;Patient/family education;Balance training    OT Goals(Current goals can be found in the care plan section) Acute Rehab OT Goals Patient Stated Goal: not stated OT Goal Formulation: With patient Time For Goal Achievement: 10/24/13 Potential to Achieve Goals: Good ADL Goals Pt Will Perform Lower Body Bathing: with set-up;with supervision;with adaptive equipment;sit to/from stand Pt Will Perform Lower Body Dressing: with set-up;with supervision;with adaptive equipment;sit to/from stand Pt Will Transfer to Toilet: with supervision;ambulating;bedside commode Pt Will Perform Toileting - Clothing Manipulation and hygiene: with modified independence;sit to/from stand Pt Will Perform Tub/Shower Transfer: Tub transfer;with supervision;ambulating;shower seat;rolling walker Additional ADL Goal #1: Pt will perform bed mobility at supervision level as precursor for ADLs.  OT Frequency: Min 2X/week   Barriers to D/C:            Co-evaluation              End of Session Equipment Utilized During Treatment: Gait belt;Rolling walker;Oxygen  Activity Tolerance: Patient limited by pain Patient left: in chair;with call bell/phone within reach;with family/visitor present   Time: 1226-1300 OT Time Calculation (min): 34 min Charges:  OT General Charges $OT Visit: 1 Procedure OT Evaluation $Initial OT Evaluation Tier I: 1 Procedure OT Treatments $Self Care/Home Management : 8-22 mins G-CodesBenito [name] OTR/L [phone] 10/17/2013, 1:24 PM

## 2013-10-17 NOTE — Progress Notes (Signed)
Physical Therapy Treatment Patient Details Name: [name] MRN: [id number] DOB: Dec 29, 1935 Today's Date: 10/17/2013    History of Present Illness Pt adm with large bowel obstruction and underwent exploratory lap and colostomy on 7/19. Pt with recent (May 2015) lt hip fx with hemiarthroplasty, severe COPD    PT Comments    Pt progressing with mobility and now is +1 assist for all aspects of mobility, however pt in extreme pain during session and only agreeable/able to ambulate to/from restroom with RW and then back to bed.  Note small area of possible skin breakdown on L inner thigh.  RN notified.  Maintained pt on 3L O2 during session with SaO2 in low to mid 90's throughout session.    Follow Up Recommendations  LTACH;SNF     Equipment Recommendations  None recommended by PT    Recommendations for Other Services       Precautions / Restrictions Precautions Precautions: Fall;Posterior Hip (unsure of hip precautions) Precaution Comments: questionable hip precautions? pt reports surgery on left hip but she did recall "I shouldnt turn my feet in" during gait Restrictions Weight Bearing Restrictions: No    Mobility  Bed Mobility Overal bed mobility: Needs Assistance Bed Mobility: Sit to Supine     Supine to sit: Mod assist Sit to supine: Mod assist;HOB elevated   General bed mobility comments: Requires assist for BLEs into bed due to increased pain in abdomen.  Pt able to bridge in order to scoot over in bed.   Transfers Overall transfer level: Needs assistance Equipment used: Rolling walker (2 wheeled) Transfers: Sit to/from Stand Sit to Stand: Min assist Stand pivot transfers: Min guard       General transfer comment: Performed x 2 from recliner and to toilet with min A.  Requires increased assist from lower toilet seat and with use of grab bars.  Cues for hand placement and increased forward weight shift.   Ambulation/Gait Ambulation/Gait assistance: Min  assist Ambulation Distance (Feet): 12 Feet (x 2 reps) Assistive device: Rolling walker (2 wheeled) Gait Pattern/deviations: Step-through pattern;Decreased stride length;Shuffle;Trunk flexed;Narrow base of support     General Gait Details: Pt with very short shuffled steps and increased pain, requiring max verbal cues to continue to bed, as she attempted to lean forward and rest on RW.    Stairs            Wheelchair Mobility    Modified Rankin (Stroke Patients Only)       Balance Overall balance assessment: Needs assistance Sitting-balance support: Feet supported Sitting balance-Leahy Scale: Fair     Standing balance support: During functional activity Standing balance-Leahy Scale: Fair Standing balance comment: Pt able to stand at sink to wash hands at min/guard to close S level.                      Cognition Arousal/Alertness: Awake/alert Behavior During Therapy: WFL for tasks assessed/performed Overall Cognitive Status: Within Functional Limits for tasks assessed                      Exercises      General Comments General comments (skin integrity, edema, etc.): Note possible area of skin breakdown on L inner thigh area.  RN notified       Pertinent Vitals/Pain Pt with extreme abdominal pain during session.  RN notified and assisted pt back to bed.     Home Living Family/patient expects to be discharged to:: Private residence Living  Arrangements: Children Available Help at Discharge: Family;Available 24 hours/day;Friend(s) Type of Home: House Home Access: Stairs to enter Entrance Stairs-Rails: Right Home Layout: One level Home Equipment: Walker - 2 wheels;Wheelchair - manual;Bedside commode;Cane - single point;Crutches;Other (comment);Walker - 4 wheels;Cane - quad (O2)      Prior Function Level of Independence: Independent with assistive device(s)          PT Goals (current goals can now be found in the care plan section) Acute  Rehab PT Goals Patient Stated Goal: not stated PT Goal Formulation: With patient Time For Goal Achievement: 10/28/13 Potential to Achieve Goals: Good Progress towards PT goals: Progressing toward goals    Frequency  Min 3X/week    PT Plan Current plan remains appropriate    Co-evaluation             End of Session Equipment Utilized During Treatment: Oxygen Activity Tolerance: Patient limited by fatigue;Patient limited by pain Patient left: in bed;with call bell/phone within reach;with family/visitor present     Time: 5638-7564 PT Time Calculation (min): 24 min  Charges:  $Gait Training: 8-22 mins $Therapeutic Activity: 8-22 mins                    G Codes:      Denice Bors 10/17/2013, 3:30 PM

## 2013-10-17 NOTE — Progress Notes (Signed)
Patient ID: [name], female   DOB: 1935/09/16, 78 y.o.   MRN: [id number] 8 Days Post-Op  Subjective: Patient feels okay today. She didn't eat much yesterday but said she was tired with transition from the step down unit to the floor.  Abdominal pain well controlled.  Objective: Vital signs in last 24 hours: Temp:  [97.5 F (36.4 C)-98.8 F (37.1 C)] 97.5 F (36.4 C) (07/27 0558) Pulse Rate:  [85-97] 96 (07/27 0558) Resp:  [17-20] 18 (07/27 0558) BP: (106-132)/(48-74) 123/69 mmHg (07/27 0558) SpO2:  [93 %-99 %] 99 % (07/27 0558) Last BM Date: 10/16/13  Intake/Output from previous day: 07/26 0701 - 07/27 0700 In: -  Out: 1350 [Urine:1000; Stool:350] Intake/Output this shift: Total I/O In: 240 [P.O.:240] Out: -   PE: Abd: Soft, appropriately tender, active bowel sounds, wound is packed and cleaned. Ostomy with good feculent output and air.  Lab Results:   Recent Labs  10/16/13 0344 10/17/13 0644  WBC 9.1 6.3  HGB 9.6* 10.1*  HCT 32.2* 33.2*  PLT 271 283   BMET  Recent Labs  10/16/13 0344 10/17/13 0644  NA 135* 133*  K 3.7 3.4*  CL 88* 90*  CO2 37* 35*  GLUCOSE 117* 100*  BUN 11 8  CREATININE 0.43* 0.47*  CALCIUM 8.6 8.5   PT/INR  Recent Labs  10/16/13 0344 10/17/13 0644  LABPROT 12.5 13.2  INR 0.93 1.00   CMP     Component Value Date/Time   NA 133* 10/17/2013 0644   K 3.4* 10/17/2013 0644   CL 90* 10/17/2013 0644   CO2 35* 10/17/2013 0644   GLUCOSE 100* 10/17/2013 0644   BUN 8 10/17/2013 0644   CREATININE 0.47* 10/17/2013 0644   CALCIUM 8.5 10/17/2013 0644   PROT 5.2* 10/17/2013 0644   ALBUMIN 2.1* 10/17/2013 0644   AST 16 10/17/2013 0644   ALT 11 10/17/2013 0644   ALKPHOS 79 10/17/2013 0644   BILITOT 0.6 10/17/2013 0644   GFRNONAA >90 10/17/2013 0644   GFRAA >90 10/17/2013 0644   Lipase     Component Value Date/Time   LIPASE 9* 09/10/2012 0431       Studies/Results: No results found.  Anti-infectives: Anti-infectives   Start      Dose/Rate Route Frequency Ordered Stop   10/15/13 1000  amoxicillin-clavulanate (AUGMENTIN) 875-125 MG per tablet 1 tablet     1 tablet Oral Every 12 hours 10/15/13 0809 10/16/13 2137   10/10/13 0600  piperacillin-tazobactam (ZOSYN) IVPB 3.375 g  Status:  Discontinued     3.375 g 12.5 mL/hr over 240 Minutes Intravenous Every 8 hours 10/09/13 2326 10/15/13 1142   10/09/13 2330  cefoTEtan (CEFOTAN) 2 g in dextrose 5 % 50 mL IVPB    Comments:  Send medication to OR   2 g 100 mL/hr over 30 Minutes Intravenous On call to O.R. 10/09/13 2321 10/10/13 0100   10/09/13 2330  piperacillin-tazobactam (ZOSYN) IVPB 3.375 g     3.375 g 100 mL/hr over 30 Minutes Intravenous  Once 10/09/13 2326 10/10/13 0100   10/09/13 2115  cefoTEtan (CEFOTAN) 2 g in dextrose 5 % 50 mL IVPB  Status:  Discontinued     2 g 100 mL/hr over 30 Minutes Intravenous To Surgery 10/09/13 2105 10/09/13 2321   10/09/13 1930  piperacillin-tazobactam (ZOSYN) IVPB 3.375 g  Status:  Discontinued     3.375 g 12.5 mL/hr over 240 Minutes Intravenous 3 times per day 10/09/13 1923 10/09/13 1928  Assessment/Plan  1. POD8, status post exploratory laparotomy with Hartman's procedure for colon obstruction  2. 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 COPD  4. ventilator dependent respiratory failure, status post extubation  5. Atrial fibrillation  6. Hypertension  7. Leukocytosis, resolved   Plan:  1. continue regular diet 2. Continue mobilization.  3. We'll need to arrange home health for ostomy care and wound care if this is her disposition, if skilled nursing is her disposition then she will not need home health. 4. no further antibiotic therapy is needed, this has been discontinued.  5. Continue twice a day dressing changes. 6. Patient is surgically stable for discharge when medically stable.    LOS: 8 days    Altair Stanko E 10/17/2013, 11:37 AM Pager: [phone]

## 2013-10-17 NOTE — Progress Notes (Signed)
ANTICOAGULATION CONSULT NOTE - Follow Up Consult  Pharmacy Consult for coumadin Indication: atrial fibrillation  Allergies  Allergen Reactions  . Tape Other (See Comments)    Paper tape only per family of the patient due to thinning of skin  . Cardizem [Diltiazem Hcl] Itching and Rash  . Sulfonamide Derivatives Rash    Patient Measurements: Height: 5\' 5"  (165.1 cm) Weight: 134 lb 4.2 oz (60.9 kg) IBW/kg (Calculated) : 57  Vital Signs: Temp: 97.5 F (36.4 C) (07/27 0558) Temp src: Oral (07/27 0558) BP: 123/69 mmHg (07/27 0558) Pulse Rate: 96 (07/27 0558)  Labs:  Recent Labs  10/15/13 0244 10/16/13 0344 10/17/13 0644  HGB 9.5* 9.6* 10.1*  HCT 31.7* 32.2* 33.2*  PLT 248 271 283  LABPROT  --  12.5 13.2  INR  --  0.93 1.00  CREATININE 0.50 0.43* 0.47*    Estimated Creatinine Clearance: 54.7 ml/min (by C-G formula based on Cr of 0.47).   Medications:  Scheduled:  . antiseptic oral rinse  15 mL Mouth Rinse BID  . digoxin  0.0625 mg Oral Daily  . feeding supplement (ENSURE COMPLETE)  237 mL Oral BID BM  . heparin subcutaneous  5,000 Units Subcutaneous 3 times per day  . metoprolol tartrate  25 mg Oral BID  . mometasone-formoterol  2 puff Inhalation BID  . pantoprazole  40 mg Oral QHS  . polyethylene glycol  17 g Oral Daily  . tiotropium  18 mcg Inhalation QHS  . verapamil  120 mg Oral BID  . Warfarin - Physician Dosing Inpatient   Does not apply q1800    Assessment: 78 yo female with afib on coumadin at home and noted s/p exploratory laparotomy with Hartman's procedure for colon obstruction.  Pharmacy has been consulted to resume coumadin on 7/25.  INR today 1.0  Goal of Therapy:  INR 2-3 Monitor platelets by anticoagulation protocol: Yes   Plan:  -Coumadin 5mg  po today -Daily PT/INR -d/c sq heparin when INR >2  [name], Pharm D 10/17/2013 9:12 AM

## 2013-10-17 NOTE — Progress Notes (Signed)
TEAM 1 - Stepdown/ICU TEAM Progress Note  ONDA KATTNER DJS:[id number] DOB: [birth year] DOA: 10/09/2013 PCP: Glenda Chroman., MD  Admit HPI / Brief Narrative: 78 y/o F with history of COPD, afib on coumadin, and several weeks of severe constipation who progressed to large bowel obstruction and was taken to OR on 7/19 for ex lap and colostomy.   Significant events:  Ex Lap 10/09/13 with Hartman's procedure for colon obstruction  ETT 7/19>>>7/22  HPI/Subjective: States she is eating small amounts because she does not want to rush it, denies nausea or vomiting Assessment/Plan:  Bowel obstruction S/p Ex Lap and colonic resection w/ colostomy for bowel obstruction and microperforation - status post completion of abx for a total of 7 days per surgeries recommendations (to end today) - care per Gen Surg - advancing to regular diet today  -Patient seen by PT today and LTAC/SNF recommended>> awaiting patient's once to talk to daughter prior to final decision>> follow Essential hypertension, benign  Good BP control, follow  Permanent atrial fibrillation w/ RVR On chronic coumadin tx - cleared per Surg to resume anticoag - restarted warfarin w/o bridge 7/25 -Rate controlled, INR remained subtherapeutic at this time, follow  Hx of SSS s/p pacer 2004  Severe COPD w/ failure to Liberate from Mechanical Ventilation on 2-3L of oxygen at home - weaned O2 to home dose - resp status stable since extubation 7/22  -Stable Mild hyponatremia  Improving/nearly resolved  Code Status: FULL Family Communication: None at bedside Disposition Plan: LTAC/SNF vs home  Consultants: Gen Surgery PCCM >> TRH   Antibiotics: Cefotetan 7/19  Pip-tazo 7/19>>7/24 Augmentin 7/25 >>7/26  DVT prophylaxis: SQ heparin + warfarin    Objective: Blood pressure 125/65, pulse 95, temperature 97.7 F (36.5 C), temperature source Oral, resp. rate 18, height 5\' 5"  (1.651 m), weight 60.9 kg (134 lb 4.2  oz), SpO2 96.00%.  Intake/Output Summary (Last 24 hours) at 10/17/13 1917 Last data filed at 10/17/13 1846  Gross per 24 hour  Intake    480 ml  Output    750 ml  Net   -270 ml   Exam: General: No acute respiratory distress Lungs: Clear to auscultation bilaterally without wheezes or crackles Cardiovascular: Regular rate and rhythm without murmur gallop or rub  Abdomen: Mildly tender diffusely, nondistended, soft, no rebound, no ascites, no appreciable mass - L sided ostomy bag with brown stool - midline wound dressed and dry  Extremities: No significant cyanosis, clubbing, or edema bilateral lower extremities  Data Reviewed: Basic Metabolic Panel:  Recent Labs Lab 10/11/13 0410 10/12/13 0403 10/13/13 0400 10/14/13 0400 10/15/13 0244 10/16/13 0344 10/17/13 0644  NA 134* 134* 132* 131* 133* 135* 133*  K 3.8 3.2* 3.0* 3.3* 4.0 3.7 3.4*  CL 91* 88* 84* 85* 87* 88* 90*  CO2 27 34* 36* 36* 35* 37* 35*  GLUCOSE 149* 112* 88 85 79 117* 100*  BUN 10 11 10 13 13 11 8   CREATININE 0.67 0.58 0.54 0.53 0.50 0.43* 0.47*  CALCIUM 7.4* 7.6* 7.8* 8.2* 8.1* 8.6 8.5  MG 1.7 1.6 1.9 1.8 1.8  --   --   PHOS 3.2 2.6 3.3 2.8 2.2*  --   --     Liver Function Tests:  Recent Labs Lab 10/16/13 0344 10/17/13 0644  AST 14 16  ALT 11 11  ALKPHOS 80 79  BILITOT 0.6 0.6  PROT 5.4* 5.2*  ALBUMIN 2.1* 2.1*   Coags:  Recent Labs Lab 10/16/13 0344 10/17/13 8850  INR 0.93 1.00   No results found for this basename: PTT,  in the last 168 hours  CBC:  Recent Labs Lab 10/13/13 0400 10/14/13 0400 10/15/13 0244 10/16/13 0344 10/17/13 0644  WBC 11.6* 9.5 9.1 9.1 6.3  HGB 10.0* 9.7* 9.5* 9.6* 10.1*  HCT 32.5* 31.6* 31.7* 32.2* 33.2*  MCV 84.2 86.6 86.6 87.7 88.8  PLT 262 252 248 271 283   CBG:  Recent Labs Lab 10/15/13 1131 10/15/13 1608 10/15/13 2006 10/15/13 2357 10/16/13 0349  GLUCAP 177* 153* 214* 160* 134*    Recent Results (from the past 240 hour(s))  SURGICAL PCR  SCREEN     Status: None   Collection Time    10/09/13  8:50 PM      Result Value Ref Range Status   MRSA, PCR NEGATIVE  NEGATIVE Final   Staphylococcus aureus NEGATIVE  NEGATIVE Final   Comment:            The Xpert SA Assay (FDA     approved for NASAL specimens     in patients over 39 years of age),     is one component of     a comprehensive surveillance     program.  Test performance has     been validated by [employer] for patients greater     than or equal to 69 year old.     It is not intended     to diagnose infection nor to     guide or monitor treatment.     Studies:  Recent x-ray studies have been reviewed in detail by the Attending Physician  Scheduled Meds:  Scheduled Meds: . antiseptic oral rinse  15 mL Mouth Rinse BID  . digoxin  0.0625 mg Oral Daily  . feeding supplement (ENSURE COMPLETE)  237 mL Oral BID BM  . heparin subcutaneous  5,000 Units Subcutaneous 3 times per day  . metoprolol tartrate  25 mg Oral BID  . mometasone-formoterol  2 puff Inhalation BID  . pantoprazole  40 mg Oral QHS  . polyethylene glycol  17 g Oral Daily  . tiotropium  18 mcg Inhalation QHS  . verapamil  120 mg Oral BID  . Warfarin - Pharmacist Dosing Inpatient   Does not apply q1800    Time spent on care of this patient: 25 mins   Sheila Oats , MD   [facility] Office  [phone] Pager - Text Page per Amion as per below:  On-Call/Text Page:      [email or web address]      password [email or web address]  If 7PM-7AM, please contact night-coverage [email or web address] Password [email or web address] 10/17/2013, 7:17 PM   LOS: 8 days

## 2013-10-17 NOTE — Progress Notes (Signed)
Doing much better.  Advancing diet.  [name]. [name], MD, [place] [phone] [facility]

## 2013-10-17 NOTE — Progress Notes (Signed)
Chaplain visited with [name] and her daughter [name]. They were in the middle of a meal so Chaplain didn't stay long. Chaplain engaged in empathetic listening. Pt was policing her words, seemingly unwilling to speak too personally around her daughter. Pt expressed being in a lot of pain citing she wasn't doing well because of it. She requested prayer that she would be on her feet again and be able to return home. She is very polite but not very cheerful. She seems to be processing many emotions.  Delford Field

## 2013-10-18 LAB — BASIC METABOLIC PANEL
Anion gap: 10 (ref 5–15)
BUN: 7 mg/dL (ref 6–23)
CALCIUM: 8.5 mg/dL (ref 8.4–10.5)
CO2: 32 mEq/L (ref 19–32)
Chloride: 93 mEq/L — ABNORMAL LOW (ref 96–112)
Creatinine, Ser: 0.48 mg/dL — ABNORMAL LOW (ref 0.50–1.10)
GFR calc non Af Amer: >90 mL/min (ref >90)
Glucose, Bld: 111 mg/dL — ABNORMAL HIGH (ref 70–99)
POTASSIUM: 3.6 meq/L — AB (ref 3.7–5.3)
SODIUM: 135 meq/L — AB (ref 137–147)

## 2013-10-18 LAB — PROTIME-INR
INR: 1.03 (ref 0.00–1.49)
PROTHROMBIN TIME: 13.5 s (ref 11.6–15.2)

## 2013-10-18 MED ORDER — ONDANSETRON HCL 4 MG PO TABS: 4.0000 mg | ORAL_TABLET | Freq: Four times a day (QID) | ORAL | Status: AC | PRN

## 2013-10-18 MED ORDER — WARFARIN SODIUM 5 MG PO TABS: 5.0000 mg | ORAL_TABLET | Freq: Every day | ORAL | Status: AC

## 2013-10-18 MED ORDER — ENSURE COMPLETE PO LIQD: 237.0000 mL | Freq: Three times a day (TID) | ORAL | Status: AC

## 2013-10-18 MED ORDER — ALPRAZOLAM 0.25 MG PO TABS: 0.2500 mg | ORAL_TABLET | Freq: Two times a day (BID) | ORAL | Status: DC | PRN

## 2013-10-18 MED ORDER — WARFARIN SODIUM 5 MG PO TABS
5.0000 mg | ORAL_TABLET | Freq: Once | ORAL | Status: AC
  Administered 2013-10-18: 5 mg via ORAL
  Filled 2013-10-18: qty 1

## 2013-10-18 MED ORDER — HYDROCODONE-ACETAMINOPHEN 5-325 MG PO TABS: 1.0000 | ORAL_TABLET | ORAL | Status: DC | PRN

## 2013-10-18 MED ORDER — POLYETHYLENE GLYCOL 3350 17 G PO PACK: 17.0000 g | PACK | Freq: Every day | ORAL | Status: AC

## 2013-10-18 MED ORDER — ENSURE COMPLETE PO LIQD
237.0000 mL | Freq: Three times a day (TID) | ORAL | Status: DC
  Administered 2013-10-18 – 2013-10-19 (×2): 237 mL via ORAL

## 2013-10-18 NOTE — Progress Notes (Signed)
Patient ID: [name], female   DOB: 1935/06/14, 78 y.o.   MRN: [id number] 9 Days Post-Op  Subjective: Patient doing okay. She still isn't eating very well drinking some of her ensure. Her pain is otherwise well controlled.  Objective: Vital signs in last 24 hours: Temp:  [97.6 F (36.4 C)-97.7 F (36.5 C)] 97.6 F (36.4 C) (07/28 0611) Pulse Rate:  [94-102] 94 (07/28 0611) Resp:  [18-19] 18 (07/28 0611) BP: (117-125)/(65-72) 119/68 mmHg (07/28 0611) SpO2:  [93 %-96 %] 94 % (07/28 0611) Last BM Date: 10/16/13  Intake/Output from previous day: 07/27 0701 - 07/28 0700 In: 480 [P.O.:480] Out: 600 [Urine:400; Stool:200] Intake/Output this shift: Total I/O In: 200 [P.O.:200] Out: -   PE: Abd: Soft, minimally tender, active bowel sounds, ostomy with good output. Wound is clean and packed  Lab Results:   Recent Labs  10/16/13 0344 10/17/13 0644  WBC 9.1 6.3  HGB 9.6* 10.1*  HCT 32.2* 33.2*  PLT 271 283   BMET  Recent Labs  10/17/13 0644 10/18/13 0344  NA 133* 135*  K 3.4* 3.6*  CL 90* 93*  CO2 35* 32  GLUCOSE 100* 111*  BUN 8 7  CREATININE 0.47* 0.48*  CALCIUM 8.5 8.5   PT/INR  Recent Labs  10/17/13 0644 10/18/13 0344  LABPROT 13.2 13.5  INR 1.00 1.03   CMP     Component Value Date/Time   NA 135* 10/18/2013 0344   K 3.6* 10/18/2013 0344   CL 93* 10/18/2013 0344   CO2 32 10/18/2013 0344   GLUCOSE 111* 10/18/2013 0344   BUN 7 10/18/2013 0344   CREATININE 0.48* 10/18/2013 0344   CALCIUM 8.5 10/18/2013 0344   PROT 5.2* 10/17/2013 0644   ALBUMIN 2.1* 10/17/2013 0644   AST 16 10/17/2013 0644   ALT 11 10/17/2013 0644   ALKPHOS 79 10/17/2013 0644   BILITOT 0.6 10/17/2013 0644   GFRNONAA >90 10/18/2013 0344   GFRAA >90 10/18/2013 0344   Lipase     Component Value Date/Time   LIPASE 9* 09/10/2012 0431       Studies/Results: No results found.  Anti-infectives: Anti-infectives   Start     Dose/Rate Route Frequency Ordered Stop   10/15/13 1000   amoxicillin-clavulanate (AUGMENTIN) 875-125 MG per tablet 1 tablet     1 tablet Oral Every 12 hours 10/15/13 0809 10/16/13 2137   10/10/13 0600  piperacillin-tazobactam (ZOSYN) IVPB 3.375 g  Status:  Discontinued     3.375 g 12.5 mL/hr over 240 Minutes Intravenous Every 8 hours 10/09/13 2326 10/15/13 1142   10/09/13 2330  cefoTEtan (CEFOTAN) 2 g in dextrose 5 % 50 mL IVPB    Comments:  Send medication to OR   2 g 100 mL/hr over 30 Minutes Intravenous On call to O.R. 10/09/13 2321 10/10/13 0100   10/09/13 2330  piperacillin-tazobactam (ZOSYN) IVPB 3.375 g     3.375 g 100 mL/hr over 30 Minutes Intravenous  Once 10/09/13 2326 10/10/13 0100   10/09/13 2115  cefoTEtan (CEFOTAN) 2 g in dextrose 5 % 50 mL IVPB  Status:  Discontinued     2 g 100 mL/hr over 30 Minutes Intravenous To Surgery 10/09/13 2105 10/09/13 2321   10/09/13 1930  piperacillin-tazobactam (ZOSYN) IVPB 3.375 g  Status:  Discontinued     3.375 g 12.5 mL/hr over 240 Minutes Intravenous 3 times per day 10/09/13 1923 10/09/13 1928       Assessment/Plan  1. POD9, status post exploratory laparotomy with Hartman's procedure  for colon obstruction  2. 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 COPD  4. ventilator dependent respiratory failure, status post extubation  5. Atrial fibrillation  6. Hypertension  7. Leukocytosis, resolved   Plan: 1. Continue to encourage diet. Otherwise the patient is surgically stable. 2. Continue wound care and routine ostomy care. Home health dependent on whether patient goes home versus skilled nursing facility.    LOS: 9 days    Brode Sculley E 10/18/2013, 10:54 AM Pager: [phone]

## 2013-10-18 NOTE — Clinical Social Work Note (Addendum)
CSW received referral from [facility] stating pt was to be discharged home with home health services and is now considering SNF placement.   CSW attempted to meet with pt at bedside to discuss discharge disposition. Pt deferred conversation to pt's daughter. Per pt, pt's daughter is currently en route to [facility] from [place], [place]. Pt denied CSW permission to contact pt's daughter via phone stating, "she will be here in about an hour, I will have her call you when she gets here."  CSW will continue to follow and assist with discharge disposition.   2:34pm Pt and pt's daughter haven decided on SNF placement at time of discharge. Pt will be discharged to [facility] once medically stable for discharge.  Lubertha Sayres, MSW, [facility] Licensed Clinical Social Worker [phone] and [phone]

## 2013-10-18 NOTE — Clinical Social Work Placement (Addendum)
Clinical Social Work Department CLINICAL SOCIAL WORK PLACEMENT NOTE 10/18/2013  Patient:  [name], [name]  Account Number:  [id number] Admit date:  10/09/2013  Clinical Social Worker:  Delrae Sawyers  Date/time:  10/18/2013 02:33 PM  Clinical Social Work is seeking post-discharge placement for this patient at the following level of care:   SKILLED NURSING   (*CSW will update this form in Epic as items are completed)   10/18/2013  Patient/family provided with [place] Department of Clinical Social Work's list of facilities offering this level of care within the geographic area requested by the patient (or if unable, by the patient's family).  10/18/2013  Patient/family informed of their freedom to choose among providers that offer the needed level of care, that participate in Medicare, Medicaid or managed care program needed by the patient, have an available bed and are willing to accept the patient.  10/18/2013  Patient/family informed of [facility]' ownership interest in [facility], as well as of the fact that they are under no obligation to receive care at this facility.  PASARR submitted to EDS on  PASARR number received on   FL2 transmitted to all facilities in geographic area requested by pt/family on  10/18/2013 FL2 transmitted to all facilities within larger geographic area on   Patient informed that his/her managed care company has contracts with or will negotiate with  certain facilities, including the following:     Patient/family informed of bed offers received:  10/18/2013 Patient chooses bed at [place] Physician recommends and patient chooses bed at    Patient to be transferred to [facility] on 10/19/2013  Patient to be transferred to facility by [facility] Patient and family notified of transfer on 10/19/2013 Name of family member notified:  Bridgette (pt's granddaughter)  The following physician request were entered in  Epic:   Additional Comments:  Lubertha Sayres, MSW, [facility] Licensed Clinical Social Worker [phone] and [phone]

## 2013-10-18 NOTE — Progress Notes (Signed)
Merrit Waugh O. Analisse Randle, III, MD, FACS [phone]--pager [phone]--office [facility]  

## 2013-10-18 NOTE — Progress Notes (Signed)
ANTICOAGULATION CONSULT NOTE - Follow Up Consult  Pharmacy Consult for coumadin Indication: atrial fibrillation  Allergies  Allergen Reactions  . Tape Other (See Comments)    Paper tape only per family of the patient due to thinning of skin  . Cardizem [Diltiazem Hcl] Itching and Rash  . Sulfonamide Derivatives Rash    Patient Measurements: Height: 5\' 5"  (165.1 cm) Weight: 134 lb 4.2 oz (60.9 kg) IBW/kg (Calculated) : 57  Vital Signs: Temp: 97.6 F (36.4 C) (07/28 0611) Temp src: Oral (07/28 0611) BP: 119/68 mmHg (07/28 0611) Pulse Rate: 94 (07/28 0611)  Labs:  Recent Labs  10/16/13 0344 10/17/13 0644 10/18/13 0344  HGB 9.6* 10.1*  --   HCT 32.2* 33.2*  --   PLT 271 283  --   LABPROT 12.5 13.2 13.5  INR 0.93 1.00 1.03  CREATININE 0.43* 0.47* 0.48*    Estimated Creatinine Clearance: 54.7 ml/min (by C-G formula based on Cr of 0.48).   Medications:  Scheduled:  . antiseptic oral rinse  15 mL Mouth Rinse BID  . digoxin  0.0625 mg Oral Daily  . feeding supplement (ENSURE COMPLETE)  237 mL Oral BID BM  . heparin subcutaneous  5,000 Units Subcutaneous 3 times per day  . metoprolol tartrate  25 mg Oral BID  . mometasone-formoterol  2 puff Inhalation BID  . pantoprazole  40 mg Oral QHS  . polyethylene glycol  17 g Oral Daily  . tiotropium  18 mcg Inhalation QHS  . verapamil  120 mg Oral BID  . Warfarin - Pharmacist Dosing Inpatient   Does not apply q1800    Assessment: 78 yo female with afib on coumadin at home and noted s/p exploratory laparotomy with Hartman's procedure for colon obstruction.  Coumadin resumed 7/25  INR 1.03 today after 2 doses of 5 mg coumadin.  May need to increase dose tomorrow if no movement of INR  Goal of Therapy:  INR 2-3 Monitor platelets by anticoagulation protocol: Yes   Plan:  -Coumadin 5mg  po today -Daily PT/INR -d/c sq heparin when INR >2  Salley Boxley, Pharm D 10/18/2013 8:11 AM

## 2013-10-18 NOTE — Clinical Social Work Psychosocial (Signed)
Clinical Social Work Department BRIEF PSYCHOSOCIAL ASSESSMENT 10/18/2013  Patient:  [name], [name]     Account Number:  [id number]     Admit date:  10/09/2013  Clinical Social Worker:  Delrae Sawyers  Date/Time:  10/18/2013 01:24 PM  Referred by:  Physician  Date Referred:  10/18/2013 Referred for  SNF Placement  Other - See comment   Other Referral:   home with home health services.   Interview type:  Patient Other interview type:   Pt deferred decision making to pt's daughter.    PSYCHOSOCIAL DATA Living Status:  FAMILY Admitted from facility:   Level of care:   Primary support name:  [name] Primary support relationship to patient:  CHILD, ADULT Degree of support available:   Strong support system.    CURRENT CONCERNS Current Concerns  Post-Acute Placement   Other Concerns:   none.    SOCIAL WORK ASSESSMENT / PLAN CSW received consult regarding change in discharge disposition. Per RNCM, pt was to be discharged home with home health services and then requested SNF placement.    CSW met with pt at bedside to discuss discharge disposition. Pt deferred all decision making to pt's daughter, [name]. CSW spoke with pt's daughter regarding discharge disposition. Pt's daughter stated pt is from home with daughter with active home health: CNA, RN, and PT. Per pt's daughter, pt to be discharged home with home health services.    CSW consulted with RNCM regarding information above. CSW signing off.   Assessment/plan status:  Psychosocial Support/Ongoing Assessment of Needs Other assessment/ plan:   none.   Information/referral to community resources:   none.    PATIENT'S/FAMILY'S RESPONSE TO PLAN OF CARE: Pt's daughter understanding and agreeable to CSW plan of care to defer discharge planning to [facility]. Pt's daughter expressed no further questions or concerns at this time.       Lubertha Sayres, MSW, [facility] Licensed Clinical Social Worker [phone] and  [phone]

## 2013-10-18 NOTE — Discharge Summary (Signed)
Physician Discharge Summary  STASSI FADELY WVP:[id number] DOB: [birth year] DOA: 10/09/2013  PCP: Glenda Chroman., MD  Admit date: 10/09/2013 Discharge date: 10/18/2013  Time spent: >30 minutes  Recommendations for Outpatient Follow-up:  Follow-up Information   Please follow up. (snf MD in 1-2days)       Follow up with WYATT, [name], MD. (call for appt upon discharge)    Specialty:  General Surgery   Contact information:   [address] [place] [address] [phone]       Follow up with [name], MD. (in1-2weeks, call for appt upon discharge)    Specialty:  Cardiology   Contact information:   [place] [address] [phone]        Discharge Diagnoses:  Principal Problem:   Bowel obstruction Active Problems:   Essential hypertension, benign   Permanent atrial fibrillation   SICK SINUS SYNDROME   COPD   Chronic respiratory failure   Acute respiratory failure   Discharge Condition: improved/stable  Diet recommendation: Low sodium heart healthy  Filed Weights   10/14/13 1745 10/15/13 0356 10/16/13 0351  Weight: 60.3 kg (132 lb 15 oz) 62.5 kg (137 lb 12.6 oz) 60.9 kg (134 lb 4.2 oz)    History of present illness:  Patient is 78 y/o female with history of COPD, afib on coumadin, several weeks of severe constipation progressed to large bowel obstruction and taken to OR on 7/19 for ex lap and colostomy. She was initially admitted to the critical care service   Hospital Course:  Bowel obstruction  As discussed above chart surgery was consulted upon admission and on 7/19 she was taken to OR for Ex Lap and colonic resection w/ colostomy for bowel obstruction and microperforation.she was started on empiric antibiotics per CCM and he followed and adjusted the antibiotics as clinically appropriate and she completed a total of 7 days of antibiotics as per surgery recommendations. As she improved. She  was started on by mouth is advanced as tolerated to a regular diet which she has been tolerating. Surgery has been following patient and from his standpoint she is okay for DC -The recommend routine ostomy and wound care -Patient was seen by PT  and LTAC/SNF recommended>> initially she declined SNF but on followup discussion with the family she is agreeable to going to the [facility] and per Education officer, museum to have a private room available for a.m. -She is to follow up with general surgery as directed   Essential hypertension, benign  Good BP control, continue outpatient medications Permanent atrial fibrillation w/ RVR  It was noted on admission that she is On chronic coumadin tx, this was held for surgery and surgery followed and cleared for the Coumadin to be resumed. It was restarted w/o bridge 7/25 and her INR on followup -7/28 remains subtherapeutic at 1. She is to continue Coumadin 5 mg on discharge her PT/INR on 7/30 and further Coumadin dosing per SNF M.D. -Rate controlled, continue verapamil metoprolol and digoxin as previously. -She is to followup with Dr. Florene Glen upon discharge. 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 beginning 7/25   General surgery  Discharge Exam: Filed Vitals:   10/18/13 1502  BP: 106/55  Pulse: 72  Temp: 97.8 F (36.6 C)  Resp: 18   Exam:  General: No acute respiratory distress  Lungs: Clear to auscultation bilaterally  without wheezes or crackles  Cardiovascular: Regular rate and rhythm without murmur gallop or rub  Abdomen: Mildly tender diffusely, nondistended, soft, no rebound, no ascites, no appreciable mass - L sided ostomy bag with brown stool - midline wound dressed and dry  Extremities: No significant cyanosis, clubbing, or edema bilateral lower extremities    Discharge Instructions You were cared for by a hospitalist during your hospital stay. If you have any questions about your discharge medications or the care you received while you were in the hospital after you are discharged, you can call the unit and asked to speak with the hospitalist on call if the hospitalist that took care of you is not available. Once you are discharged, your primary care physician will handle any further medical issues. Please note that NO REFILLS for any discharge medications will be authorized once you are discharged, as it is imperative that you return to your primary care physician (or establish a relationship with a primary care physician if you do not have one) for your aftercare needs so that they can reassess your need for medications and monitor your lab values.  Discharge Instructions   Diet - low sodium heart healthy    Complete by:  As directed      Increase activity slowly    Complete by:  As directed             Medication List    STOP taking these medications       predniSONE 10 MG tablet  Commonly known as:  DELTASONE      TAKE these medications       ALPRAZolam 0.25 MG tablet  Commonly known as:  XANAX  Take 1 tablet (0.25 mg total) by mouth 2 (two) times daily as needed for anxiety. For anxiety     digoxin 0.125 MG tablet  Commonly known as:  LANOXIN  Take 1 tablet (0.125 mg total) by mouth daily.     feeding supplement (ENSURE COMPLETE) Liqd  Take 237 mLs by mouth 3 (three) times daily between meals.     fluticasone-salmeterol 115-21 MCG/ACT inhaler  Commonly known as:  ADVAIR HFA   Inhale 2 puffs into the lungs 2 (two) times daily.     furosemide 40 MG tablet  Commonly known as:  LASIX  Take 40 mg by mouth daily as needed. Fluid retention     HYDROcodone-acetaminophen 5-325 MG per tablet  Commonly known as:  NORCO/VICODIN  Take 1 tablet by mouth every 4 (four) hours as needed for moderate pain.     ipratropium-albuterol 0.5-2.5 (3) MG/3ML Soln  Commonly known as:  DUONEB  Take 3 mLs by nebulization daily as needed (for shortness of breath/wheezing).     metoprolol tartrate 25 MG tablet  Commonly known as:  LOPRESSOR  Take 25 mg by mouth 2 (two) times daily.     ondansetron 4 MG tablet  Commonly known as:  ZOFRAN  Take 1 tablet (4 mg total) by mouth every 6 (six) hours as needed for nausea.  pantoprazole 40 MG tablet  Commonly known as:  PROTONIX  Take 40 mg by mouth 2 (two) times daily.     polyethylene glycol packet  Commonly known as:  MIRALAX / GLYCOLAX  Take 17 g by mouth daily.     PROAIR HFA 108 (90 BASE) MCG/ACT inhaler  Generic drug:  albuterol  Inhale 2 puffs into the lungs every 6 (six) hours as needed for wheezing or shortness of breath.     raloxifene 60 MG tablet  Commonly known as:  EVISTA  Take 60 mg by mouth every morning.     tiotropium 18 MCG inhalation capsule  Commonly known as:  SPIRIVA  Place 18 mcg into inhaler and inhale at bedtime.     verapamil 120 MG CR tablet  Commonly known as:  CALAN-SR  Take 1 tablet (120 mg total) by mouth 2 (two) times daily.     warfarin 5 MG tablet  Commonly known as:  COUMADIN  Take 1 tablet (5 mg total) by mouth daily. PT/INR in am 7/30 and further dosing per SNF MD       Allergies  Allergen Reactions  . Tape Other (See Comments)    Paper tape only per family of the patient due to thinning of skin  . Cardizem [Diltiazem Hcl] Itching and Rash  . Sulfonamide Derivatives Rash      The results of significant diagnostics from this hospitalization (including imaging, microbiology,  ancillary and laboratory) are listed below for reference.    Significant Diagnostic Studies: Dg Chest Port 1 View  10/14/2013   CLINICAL DATA:  78 year old female status post laparotomy, bowel surgery. Initial encounter.  EXAM: PORTABLE CHEST - 1 VIEW  COMPARISON:  10/12/2013 and earlier.  FINDINGS: Portable AP semi upright view at 0633 hrs. Extubated. Stable lung volumes. Stable enteric tube. Stable right IJ central line. Stable left chest cardiac pacemaker. Stable cardiac size and mediastinal contours. Continued small bilateral pleural effusions and dense retrocardiac opacity. Pulmonary vascular congestion has increased, no overt edema.  IMPRESSION: 1. Extubated.  Otherwise, stable lines and tubes. 2. Increased pulmonary vascular congestion without overt edema. 3. Stable bilateral pleural effusions and lower lobe collapse or consolidation.   Electronically Signed   By: Lars Pinks M.D.   On: 10/14/2013 07:52   Dg Chest Port 1 View  10/12/2013   CLINICAL DATA:  Status post hip arthroplasty  EXAM: PORTABLE CHEST - 1 VIEW  COMPARISON:  Portable chest x-ray of October 11, 2013  FINDINGS: The lungs are slightly better inflated today. The pulmonary interstitium has improved. There remain small bilateral pleural effusions and there is left lower lobe atelectasis. The cardiac silhouette is top-normal in size. The pulmonary vascularity is normal.  The endotracheal tube tip lies 5.2 cm above the crotch of the carina. The esophagogastric tube tip and proximal port lie below the GE junction. The right internal jugular venous catheter tip lies in the proximal SVC. The permanent pacemaker is in appropriate position.  IMPRESSION: There has been interval improvement in the appearance of the lungs consistent with resolving interstitial edema. There remains left lower lobe atelectasis and small bilateral pleural effusions. The support tubes and lines are in appropriate position.   Electronically Signed   By: David  Martinique   On:  10/12/2013 08:04   Dg Chest Port 1 View  10/11/2013   CLINICAL DATA:  Intubation.  EXAM: PORTABLE CHEST - 1 VIEW  COMPARISON:  10/10/2013.  FINDINGS: Endotracheal tube, NG tube, right IJ line in  stable position. Cardiac pacer with lead tips in the right atrium and right ventricle. Heart size is stable. Bibasilar atelectasis and/or infiltrates with small bilateral pleural effusions noted. No pneumothorax. No acute osseous abnormality.  IMPRESSION: 1. Line and tube position stable. 2. Persistent bibasilar atelectasis and/or infiltrates and bilateral small pleural effusions.   Electronically Signed   By: Marcello Moores  Register   On: 10/11/2013 07:42   Dg Chest Port 1 View  10/10/2013   CLINICAL DATA:  TLC placement  EXAM: PORTABLE CHEST - 1 VIEW  COMPARISON:  Earlier in the day at 70 AM.  FINDINGS: 10:04 a.m. Endotracheal tube 5.2 cm above carina. Pacer with leads at right atrium and right ventricle. No lead discontinuity. Nasogastric tube terminates at the body of the stomach. Right internal jugular line is unchanged with tip at low SVC. No new catheter identified.  Normal heart size. Mild right hemidiaphragm elevation. Probable small layering bilateral pleural effusions. No pneumothorax. Low lung volumes with patchy bibasilar airspace disease. Minimally increased.  IMPRESSION: Similar appearance of support apparatus, without new catheter identified.  Small bilateral pleural effusions with slightly increased Airspace disease, likely atelectasis.   Electronically Signed   By: Abigail Miyamoto M.D.   On: 10/10/2013 10:13   Dg Chest Portable 1 View  10/10/2013   CLINICAL DATA:  Patient on ventilator status post abdominal surgery  EXAM: PORTABLE CHEST - 1 VIEW  COMPARISON:  10/09/2013  FINDINGS: Endotracheal tube in unchanged position. NG tube again crosses the gastroesophageal junction and 2 lead pacer noted. Right internal jugular central line stable.  Elevated right diaphragm with blunting of right costophrenic angle  stable. This suggests right lower lobe consolidation and possibly a small effusion. On the left, there is mild hazy lower lobe opacity. This is minimally worse when compared to the prior study.  IMPRESSION: Minimally worse bilateral lower lobe opacities suggesting bibasilar consolidation possibly atelectasis. Tiny pleural effusions not excluded.   Electronically Signed   By: Skipper Cliche M.D.   On: 10/10/2013 08:01   Dg Chest Port 1 View  10/09/2013   CLINICAL DATA:  Endotracheal tube and central line placement.  EXAM: PORTABLE CHEST - 1 VIEW  COMPARISON:  Chest radiograph performed 09/27/2013  FINDINGS: The patient's endotracheal tube is seen ending 4-5 cm above the carina. A right IJ line is noted ending about the mid SVC. An enteric tube tip extends below the diaphragm.  Lungs expansion is mildly decreased. Mild bibasilar airspace opacities likely reflect atelectasis. No pleural effusion or pneumothorax is seen  The cardiomediastinal silhouette is borderline normal in size. A pacemaker is noted overlying the left chest wall, with leads ending overlying the right atrium and right ventricle. No acute osseous abnormalities are seen.  IMPRESSION: 1. Endotracheal tube seen ending 4-5 cm above the carina. 2. Right IJ line noted ending about the mid SVC. 3. Mildly hypoexpanded lungs; mild bibasilar airspace opacities likely reflect atelectasis.   Electronically Signed   By: Garald Balding M.D.   On: 10/09/2013 23:43   Dg Abd Portable 1v  10/13/2013   CLINICAL DATA:  Abdominal discomfort.  EXAM: PORTABLE ABDOMEN - 1 VIEW  COMPARISON:  CT 10/07/2013.  FINDINGS: NG tube noted projected over stomach. The stomach is nondistended. Ostomy site left abdomen now noted. Persistent distention of the left, transverse, and right colon noted. The distention has diminished from prior exam. Stool is noted in colon. No free air. Aortoiliac atherosclerotic vascular disease.Bilateral hip replacements degenerative changes  scoliosis lumbar spine.  IMPRESSION:  1. Left abdomen ostomy site now noted. 2. Previously identified colonic distention remains but had subsided partially. 3. NG tube is noted projected over stomach.  No gastric distention.   Electronically Signed   By: Marcello Moores  Register   On: 10/13/2013 16:55    Microbiology: Recent Results (from the past 240 hour(s))  SURGICAL PCR SCREEN     Status: None   Collection Time    10/09/13  8:50 PM      Result Value Ref Range Status   MRSA, PCR NEGATIVE  NEGATIVE Final   Staphylococcus aureus NEGATIVE  NEGATIVE Final   Comment:            The Xpert SA Assay (FDA     approved for NASAL specimens     in patients over 29 years of age),     is one component of     a comprehensive surveillance     program.  Test performance has     been validated by [employer] for patients greater     than or equal to 82 year old.     It is not intended     to diagnose infection nor to     guide or monitor treatment.     Labs: Basic Metabolic Panel:  Recent Labs Lab 10/12/13 0403 10/13/13 0400 10/14/13 0400 10/15/13 0244 10/16/13 0344 10/17/13 0644 10/18/13 0344  NA 134* 132* 131* 133* 135* 133* 135*  K 3.2* 3.0* 3.3* 4.0 3.7 3.4* 3.6*  CL 88* 84* 85* 87* 88* 90* 93*  CO2 34* 36* 36* 35* 37* 35* 32  GLUCOSE 112* 88 85 79 117* 100* 111*  BUN 11 10 13 13 11 8 7   CREATININE 0.58 0.54 0.53 0.50 0.43* 0.47* 0.48*  CALCIUM 7.6* 7.8* 8.2* 8.1* 8.6 8.5 8.5  MG 1.6 1.9 1.8 1.8  --   --   --   PHOS 2.6 3.3 2.8 2.2*  --   --   --    Liver Function Tests:  Recent Labs Lab 10/16/13 0344 10/17/13 0644  AST 14 16  ALT 11 11  ALKPHOS 80 79  BILITOT 0.6 0.6  PROT 5.4* 5.2*  ALBUMIN 2.1* 2.1*   No results found for this basename: LIPASE, AMYLASE,  in the last 168 hours No results found for this basename: AMMONIA,  in the last 168 hours CBC:  Recent Labs Lab 10/13/13 0400 10/14/13 0400 10/15/13 0244 10/16/13 0344 10/17/13 0644  WBC 11.6* 9.5 9.1 9.1  6.3  HGB 10.0* 9.7* 9.5* 9.6* 10.1*  HCT 32.5* 31.6* 31.7* 32.2* 33.2*  MCV 84.2 86.6 86.6 87.7 88.8  PLT 262 252 248 271 283   Cardiac Enzymes: No results found for this basename: CKTOTAL, CKMB, CKMBINDEX, TROPONINI,  in the last 168 hours BNP: BNP (last 3 results) No results found for this basename: PROBNP,  in the last 8760 hours CBG:  Recent Labs Lab 10/15/13 1131 10/15/13 1608 10/15/13 2006 10/15/13 2357 10/16/13 0349  GLUCAP 177* 153* 214* 160* 134*       Signed:  Levester Waldridge C  [facility] 10/18/2013, 3:57 PM

## 2013-10-18 NOTE — Progress Notes (Signed)
NUTRITION FOLLOW UP  Intervention:   Ensure Complete po TID, each supplement provides 350 kcal and 13 grams of protein  Nutrition Dx:   Inadequate oral intake related to bowel obstruction as evidenced by reported intake less than estimated needs.  Goal:   Pt to meet >/= 90% of their estimated nutrition needs; not met  Monitor:   Weight trends, labs, po intake, acceptance of supplements  Assessment:   78 y/o woman with history of COPD, afib on coumadin, several weeks of severe constipation which progressed to distal large bowel obstruction.  Patient s/p procedures 7/20:  EXPLORATORY LAPAROTOMY  HARTMANN PROCEDURE  MOBILIZATION OF SPLENIC FLEXURE  - pt extubated 7/22. - Current diet is heart healthy and pt is consuming 20-100% of meals. Per pt and daughter, pt's po intake has been very poor. Pt will take sips of Ensure Complete. Pt was encouraged to continue drinking Ensure Complete or Carnation Instant Breakfast Essentials when at home until appetite returns to normal.   Height: Ht Readings from Last 1 Encounters:  10/14/13 _0  (1.651 m)    Weight Status:   Wt Readings from Last 1 Encounters:  10/16/13 134 lb 4.2 oz (60.9 kg)    Re-estimated needs:  Kcal: 1600-1800 Protein: 90-100 g Fluid: 1.6-1.8 L/day  Skin: abdominal incision, stage II pressure ulcer to buttocks  Diet Order: Cardiac   Intake/Output Summary (Last 24 hours) at 10/18/13 1420 Last data filed at 10/18/13 0945  Gross per 24 hour  Intake    320 ml  Output    600 ml  Net   -280 ml    Last BM: 7/28, pt has colostomy in place   Labs:   Recent Labs Lab 10/13/13 0400 10/14/13 0400 10/15/13 0244 10/16/13 0344 10/17/13 0644 10/18/13 0344  NA 132* 131* 133* 135* 133* 135*  K 3.0* 3.3* 4.0 3.7 3.4* 3.6*  CL 84* 85* 87* 88* 90* 93*  CO2 36* 36* 35* 37* 35* 32  BUN _1 CREATININE 0.54 0.53 0.50 0.43* 0.47* 0.48*  CALCIUM 7.8* 8.2* 8.1* 8.6 8.5 8.5  MG 1.9 1.8 1.8  --   --   --    PHOS 3.3 2.8 2.2*  --   --   --   GLUCOSE 88 85 79 117* 100* 111*    CBG (last 3)   Recent Labs  10/15/13 2006 10/15/13 2357 10/16/13 0349  GLUCAP 214* 160* 134*    Scheduled Meds: . antiseptic oral rinse  15 mL Mouth Rinse BID  . digoxin  0.0625 mg Oral Daily  . feeding supplement (ENSURE COMPLETE)  237 mL Oral BID BM  . heparin subcutaneous  5,000 Units Subcutaneous 3 times per day  . metoprolol tartrate  25 mg Oral BID  . mometasone-formoterol  2 puff Inhalation BID  . pantoprazole  40 mg Oral QHS  . polyethylene glycol  17 g Oral Daily  . tiotropium  18 mcg Inhalation QHS  . verapamil  120 mg Oral BID  . warfarin  5 mg Oral ONCE-1800  . Warfarin - Pharmacist Dosing Inpatient   Does not apply q1800    Continuous Infusions:   Terrace Arabia RD, LDN

## 2013-10-19 DIAGNOSIS — E43 Unspecified severe protein-calorie malnutrition: Secondary | ICD-10-CM

## 2013-10-19 LAB — PROTIME-INR
INR: 1.06 (ref 0.00–1.49)
PROTHROMBIN TIME: 13.8 s (ref 11.6–15.2)

## 2013-10-19 MED ORDER — ALPRAZOLAM 0.25 MG PO TABS: 0.2500 mg | ORAL_TABLET | Freq: Two times a day (BID) | ORAL | Status: AC | PRN

## 2013-10-19 MED ORDER — HYDROCODONE-ACETAMINOPHEN 5-325 MG PO TABS: 1.0000 | ORAL_TABLET | ORAL | Status: AC | PRN

## 2013-10-19 MED ORDER — WARFARIN SODIUM 7.5 MG PO TABS: 7.5000 mg | ORAL_TABLET | Freq: Once | ORAL | Status: DC

## 2013-10-19 MED ORDER — NYSTATIN 100000 UNIT/GM EX POWD: 2.0000 g | Freq: Three times a day (TID) | CUTANEOUS | Status: DC

## 2013-10-19 MED ORDER — WARFARIN SODIUM 7.5 MG PO TABS
7.5000 mg | ORAL_TABLET | Freq: Once | ORAL | Status: DC
  Filled 2013-10-19: qty 1

## 2013-10-19 NOTE — Progress Notes (Signed)
[facility] Progress Note  10 Days Post-Op  Subjective: Pt doing well, having little pain.  No N/V, appetite improving.  Ready to go to SNF.  Working on Psychiatric nurse.  Working with PT.    Objective: Vital signs in last 24 hours: Temp:  [97.7 F (36.5 C)-98.2 F (36.8 C)] 97.7 F (36.5 C) (07/29 0456) Pulse Rate:  [72-107] 77 (07/29 0456) Resp:  [18-49] 49 (07/29 0456) BP: (106-133)/(55-73) 106/62 mmHg (07/29 0456) SpO2:  [94 %-97 %] 97 % (07/29 0456) Last BM Date: 10/16/13  Intake/Output from previous day: 07/28 0701 - 07/29 0700 In: 440 [P.O.:440] Out: 450 [Urine:450] Intake/Output this shift:    PE: Gen:  Alert, NAD, pleasant Abd: Soft, NT/ND, +BS, no HSM, midline wound clean, pink, ostomy pink with brownish output and flatus  Lab Results:   Recent Labs  10/17/13 0644  WBC 6.3  HGB 10.1*  HCT 33.2*  PLT 283   BMET  Recent Labs  10/17/13 0644 10/18/13 0344  NA 133* 135*  K 3.4* 3.6*  CL 90* 93*  CO2 35* 32  GLUCOSE 100* 111*  BUN 8 7  CREATININE 0.47* 0.48*  CALCIUM 8.5 8.5   PT/INR  Recent Labs  10/18/13 0344 10/19/13 0429  LABPROT 13.5 13.8  INR 1.03 1.06   CMP     Component Value Date/Time   NA 135* 10/18/2013 0344   K 3.6* 10/18/2013 0344   CL 93* 10/18/2013 0344   CO2 32 10/18/2013 0344   GLUCOSE 111* 10/18/2013 0344   BUN 7 10/18/2013 0344   CREATININE 0.48* 10/18/2013 0344   CALCIUM 8.5 10/18/2013 0344   PROT 5.2* 10/17/2013 0644   ALBUMIN 2.1* 10/17/2013 0644   AST 16 10/17/2013 0644   ALT 11 10/17/2013 0644   ALKPHOS 79 10/17/2013 0644   BILITOT 0.6 10/17/2013 0644   GFRNONAA >90 10/18/2013 0344   GFRAA >90 10/18/2013 0344   Lipase     Component Value Date/Time   LIPASE 9* 09/10/2012 0431       Studies/Results: No results found.  Anti-infectives: Anti-infectives   Start     Dose/Rate Route Frequency Ordered Stop   10/15/13 1000  amoxicillin-clavulanate (AUGMENTIN) 875-125 MG per tablet 1 tablet     1 tablet  Oral Every 12 hours 10/15/13 0809 10/16/13 2137   10/10/13 0600  piperacillin-tazobactam (ZOSYN) IVPB 3.375 g  Status:  Discontinued     3.375 g 12.5 mL/hr over 240 Minutes Intravenous Every 8 hours 10/09/13 2326 10/15/13 1142   10/09/13 2330  cefoTEtan (CEFOTAN) 2 g in dextrose 5 % 50 mL IVPB    Comments:  Send medication to OR   2 g 100 mL/hr over 30 Minutes Intravenous On call to O.R. 10/09/13 2321 10/10/13 0100   10/09/13 2330  piperacillin-tazobactam (ZOSYN) IVPB 3.375 g     3.375 g 100 mL/hr over 30 Minutes Intravenous  Once 10/09/13 2326 10/10/13 0100   10/09/13 2115  cefoTEtan (CEFOTAN) 2 g in dextrose 5 % 50 mL IVPB  Status:  Discontinued     2 g 100 mL/hr over 30 Minutes Intravenous To Surgery 10/09/13 2105 10/09/13 2321   10/09/13 1930  piperacillin-tazobactam (ZOSYN) IVPB 3.375 g  Status:  Discontinued     3.375 g 12.5 mL/hr over 240 Minutes Intravenous 3 times per day 10/09/13 1923 10/09/13 1928       Assessment/Plan 1. POD #10, status post exploratory laparotomy with Hartman's procedure for colon obstruction  2. Pathology, diverticular stricture with  no evidence of malignancy  3. COPD  4. ventilator dependent respiratory failure, status post extubation  5. Atrial fibrillation  6. Hypertension  7. Leukocytosis, resolved   Plan:  1. Continue to encourage diet. Otherwise the patient is surgically stable for discharge.  2. Continue wound care and routine ostomy care at SNF will be needed.  BID WD dressing changes to midline wound and ostomy care/teaching    LOS: 10 days    DORT, Allsion Nogales 10/19/2013, 7:28 AM Pager: [phone]

## 2013-10-19 NOTE — Consult Note (Signed)
WOC wound follow up  requested to evaluate wounds on arms per family.    Wound type: skin tear x 2 and new onset candida in the left groin and on the labial area.  Measurement: Skin tear left posterior triceps: 0.5cm x 0.5cm x 0.2cm  Skin tear left forearm: 3.5cm x 4.0cm x 0.2cm with skin flap present.  Wound bed: skin flap intact on the forearm wound, I was able to re approximate it somewhat. Skin flap over the smaller posterior area  Drainage (amount, consistency, odor) moderate serosanguinous, no odor, non purulent  Periwound: intact but patient has ecchymosis over both arm and very frail, thin skin  Dressing procedure/placement/frequency:  Implemented antifungal powder to the left groin and labia, requested PO Diflucan for patient.  Antifungal powder to be applied to affected area BID.  Silicone foam for the posterior left arm wound, change every 3 days.  Reapproximated skin flap left forearm, benzion applied to the periwound skin and steri-strips used to hold skin flap in place. Top with silicone foam.  LEAVE STERISTRIPS IN PLACE UNTIL THEY FALL OFF OR TURN LOOSE.  Ok to change topper foam dressing every 3 days being careful to not disrupt wound bed if possible.   Ostomy care: Change ostomy wafer and pouch every 3 days.  Using 2 3/4" [place], may be ok to switch to 2 1/4" after another week once stomal edema has decreased.   Julya Alioto [place] RN,CWOCN [phone]

## 2013-10-19 NOTE — Clinical Social Work Note (Signed)
Discharge summary has been faxed to [facility] SN[facility]. Discharge packet has been completed and placed on pt's shadow chart. Pt's granddaughter, Shawna Orleans [phone]), updated regarding discharge disposition. Pt's granddaughter states she will be transporting pt via personal vehicle. Pt's granddaughter reports she has brought pt's portable oxygen tank for transport.  RN to please call report to [facility], MSW, [facility] Licensed Clinical Social Worker [phone] and [phone]

## 2013-10-19 NOTE — Progress Notes (Signed)
ANTICOAGULATION CONSULT NOTE - Follow Up Consult  Pharmacy Consult for coumadin Indication: atrial fibrillation  Allergies  Allergen Reactions  . Tape Other (See Comments)    Paper tape only per family of the patient due to thinning of skin  . Cardizem [Diltiazem Hcl] Itching and Rash  . Sulfonamide Derivatives Rash    Patient Measurements: Height: 5\' 5"  (165.1 cm) Weight: 134 lb 4.2 oz (60.9 kg) IBW/kg (Calculated) : 57  Vital Signs: Temp: 97.7 F (36.5 C) (07/29 0456) Temp src: Oral (07/29 0456) BP: 106/62 mmHg (07/29 0456) Pulse Rate: 77 (07/29 0456)  Labs:  Recent Labs  10/17/13 0644 10/18/13 0344 10/19/13 0429  HGB 10.1*  --   --   HCT 33.2*  --   --   PLT 283  --   --   LABPROT 13.2 13.5 13.8  INR 1.00 1.03 1.06  CREATININE 0.47* 0.48*  --     Estimated Creatinine Clearance: 54.7 ml/min (by C-G formula based on Cr of 0.48).   Medications:  Scheduled:  . antiseptic oral rinse  15 mL Mouth Rinse BID  . digoxin  0.0625 mg Oral Daily  . feeding supplement (ENSURE COMPLETE)  237 mL Oral TID BM  . heparin subcutaneous  5,000 Units Subcutaneous 3 times per day  . metoprolol tartrate  25 mg Oral BID  . mometasone-formoterol  2 puff Inhalation BID  . pantoprazole  40 mg Oral QHS  . polyethylene glycol  17 g Oral Daily  . tiotropium  18 mcg Inhalation QHS  . verapamil  120 mg Oral BID  . Warfarin - Pharmacist Dosing Inpatient   Does not apply q1800    Assessment: 78 yo female with afib on coumadin at home and noted s/p exploratory laparotomy with Hartman's procedure for colon obstruction.  Coumadin resumed 7/25  INR 1.06 today after 4 doses of 5 mg coumadin.   Goal of Therapy:  INR 2-3 Monitor platelets by anticoagulation protocol: Yes   Plan:  -Coumadin 7.5mg  po today -Daily PT/INR -d/c sq heparin when INR >2  Berta Denson, Pharm D 10/19/2013 8:21 AM

## 2013-10-19 NOTE — Progress Notes (Signed)
D/c via wheelchair with granddaughter to [facility] in [place] for [place]. Report called and d/c instructions given with paperwork to family.  O2 chronic has portable tank that is on full for the transfer. Pain denied. Dressings are intact

## 2013-10-19 NOTE — Progress Notes (Signed)
Spoke with Adah Perl at [facility] rehab facility and gave report for admission.

## 2013-10-19 NOTE — Progress Notes (Signed)
Agree with above PA note.  [name]. [name], MD, [place] [phone] [facility]

## 2013-10-19 NOTE — Discharge Summary (Signed)
Physician Discharge Summary  [name] JOI:[id number] DOB: [birth year] DOA: 10/09/2013  PCP: Glenda Chroman., MD  Admit date: 10/09/2013 Discharge date: 10/19/2013  Dr Inis Sizer did the initial d/c summary on 7/28. I am updating it today as she is being discharged today.   Time spent: >30 minutes  Recommendations for Outpatient Follow-up:  Follow-up Information   Please follow up. (snf MD in 1-2days)       Follow up with WYATT, Kelly Eriksson, MD. (call for appt upon discharge)    Specialty:  General Surgery   Contact information:   [address], [place] [address] [phone]       Follow up with Kelly Lesches, MD. (in1-2weeks, call for appt upon discharge)    Specialty:  Cardiology   Contact information:   [place] [address] [phone]        Discharge Diagnoses:  Principal Problem:   Bowel obstruction Active Problems:   Essential hypertension, benign   Permanent atrial fibrillation   SICK SINUS SYNDROME   COPD   Chronic respiratory failure   Acute respiratory failure   Discharge Condition: improved/stable  Diet recommendation: Low sodium heart healthy  Filed Weights   10/14/13 1745 10/15/13 0356 10/16/13 0351  Weight: 60.3 kg (132 lb 15 oz) 62.5 kg (137 lb 12.6 oz) 60.9 kg (134 lb 4.2 oz)    History of present illness:  Patient is 78 y/o female with history of COPD, afib on coumadin, several weeks of severe constipation progressed to large bowel obstruction and taken to OR on 7/19 for ex lap and colostomy. She was initially admitted to the critical care service   Hospital Course:  Bowel obstruction  As discussed above chart surgery was consulted upon admission and on 7/19 she was taken to OR for Ex Lap and colonic resection w/ colostomy for bowel obstruction and microperforation.she was started on empiric antibiotics per CCM and he followed and adjusted the antibiotics as clinically  appropriate and she completed a total of 7 days of antibiotics as per surgery recommendations. As she improved. She was started on by mouth is advanced as tolerated to a regular diet which she has been tolerating. Surgery has been following patient and from his standpoint she is okay for DC -Patient was seen by PT  and LTAC/SNF recommended>> initially she declined SNF but on followup discussion with the family she is agreeable to going to the [facility] and per social worker to have a private room available for a.m. -She is to follow up with general surgery as directed - Wound Care:  Antifungal powder to be applied to affected area BID.  Silicone foam for the posterior left arm wound, change every 3 days.  Reapproximated skin flap left forearm, benzion applied to the periwound skin and steri-strips used to hold skin flap in place. Top with silicone foam. LEAVE STERISTRIPS IN PLACE UNTIL THEY FALL OFF OR TURN LOOSE. Ok to change topper foam dressing every 3 days being careful to not disrupt wound bed if possible.  Ostomy care: Change ostomy wafer and pouch every 3 days. Using 2 3/4" Holl[place], may be ok to switch to 2 1/4" after another week once stomal edema has decreased.    Essential hypertension, benign  Good BP control, continue outpatient medications  Permanent atrial fibrillation w/ RVR  It was noted on admission that she is On chronic coumadin tx, this was held for surgery and surgery followed and  cleared for the Coumadin to be resumed. It was restarted w/o bridge 7/25 and her INR on followup -7/28 remains subtherapeutic at 1. She is to continue Coumadin 5 mg on discharge her PT/INR on 7/30 and further Coumadin dosing per SNF M.D. -Rate controlled, continue verapamil metoprolol and digoxin as previously. -She is to followup with Dr. Florene Glen upon discharge . Hx of SSS s/p pacer 2004   Severe COPD w/ failure to Liberate from Mechanical Ventilation  on 2-3L of oxygen at home - weaned O2 to  home dose - resp status stable since extubation 7/22  -Has remained stable oxygenating well on home O2 which she is to continue-2-3 L upon discharge to nursing facility  Mild hyponatremia  Sodium stable at 135   Procedures/significant events: Ex Lap 10/09/13 with Hartman's procedure for colon obstruction  ETT 7/19>>>7/22   Consultations:   as above patient was in the critical care service through 7/24>>transferred to [facility] beginning 7/25   General surgery  Discharge Exam: Filed Vitals:   10/19/13 1034  BP:   Pulse: 78  Temp:   Resp:    Exam:  General: No acute respiratory distress  Lungs: Clear to auscultation bilaterally without wheezes or crackles  Cardiovascular: Regular rate and rhythm without murmur gallop or rub  Abdomen: Mildly tender diffusely, nondistended, soft, no rebound, no ascites, no appreciable mass - L sided ostomy bag with brown stool - midline wound dressed and dry  Extremities: No significant cyanosis, clubbing, or edema bilateral lower extremities    Discharge Instructions You were cared for by a hospitalist during your hospital stay. If you have any questions about your discharge medications or the care you received while you were in the hospital after you are discharged, you can call the unit and asked to speak with the hospitalist on call if the hospitalist that took care of you is not available. Once you are discharged, your primary care physician will handle any further medical issues. Please note that NO REFILLS for any discharge medications will be authorized once you are discharged, as it is imperative that you return to your primary care physician (or establish a relationship with a primary care physician if you do not have one) for your aftercare needs so that they can reassess your need for medications and monitor your lab values.      Discharge Instructions   Diet - low sodium heart healthy    Complete by:  As directed      Increase activity  slowly    Complete by:  As directed             Medication List    STOP taking these medications       predniSONE 10 MG tablet  Commonly known as:  DELTASONE      TAKE these medications       ALPRAZolam 0.25 MG tablet  Commonly known as:  XANAX  Take 1 tablet (0.25 mg total) by mouth 2 (two) times daily as needed for anxiety. For anxiety     digoxin 0.125 MG tablet  Commonly known as:  LANOXIN  Take 1 tablet (0.125 mg total) by mouth daily.     feeding supplement (ENSURE COMPLETE) Liqd  Take 237 mLs by mouth 3 (three) times daily between meals.     fluticasone-salmeterol 115-21 MCG/ACT inhaler  Commonly known as:  ADVAIR HFA  Inhale 2 puffs into the lungs 2 (two) times daily.     furosemide 40 MG tablet  Commonly known  as:  LASIX  Take 40 mg by mouth daily as needed. Fluid retention     HYDROcodone-acetaminophen 5-325 MG per tablet  Commonly known as:  NORCO/VICODIN  Take 1 tablet by mouth every 4 (four) hours as needed for moderate pain.     ipratropium-albuterol 0.5-2.5 (3) MG/3ML Soln  Commonly known as:  DUONEB  Take 3 mLs by nebulization daily as needed (for shortness of breath/wheezing).     metoprolol tartrate 25 MG tablet  Commonly known as:  LOPRESSOR  Take 25 mg by mouth 2 (two) times daily.     nystatin 100000 UNIT/GM Powd  Apply 2 g topically 3 (three) times daily.     ondansetron 4 MG tablet  Commonly known as:  ZOFRAN  Take 1 tablet (4 mg total) by mouth every 6 (six) hours as needed for nausea.     pantoprazole 40 MG tablet  Commonly known as:  PROTONIX  Take 40 mg by mouth 2 (two) times daily.     polyethylene glycol packet  Commonly known as:  MIRALAX / GLYCOLAX  Take 17 g by mouth daily.     PROAIR HFA 108 (90 BASE) MCG/ACT inhaler  Generic drug:  albuterol  Inhale 2 puffs into the lungs every 6 (six) hours as needed for wheezing or shortness of breath.     raloxifene 60 MG tablet  Commonly known as:  EVISTA  Take 60 mg by mouth  every morning.     tiotropium 18 MCG inhalation capsule  Commonly known as:  SPIRIVA  Place 18 mcg into inhaler and inhale at bedtime.     verapamil 120 MG CR tablet  Commonly known as:  CALAN-SR  Take 1 tablet (120 mg total) by mouth 2 (two) times daily.     warfarin 5 MG tablet  Commonly known as:  COUMADIN  Take 1 tablet (5 mg total) by mouth daily. PT/INR in am 7/30 and further dosing per SNF MD       Allergies  Allergen Reactions  . Tape Other (See Comments)    Paper tape only per family of the patient due to thinning of skin  . Cardizem [Diltiazem Hcl] Itching and Rash  . Sulfonamide Derivatives Rash   Follow-up Information   Please follow up. (snf MD in 1-2days)       Follow up with WYATT, Kelly Eriksson, MD. (call for appt upon discharge)    Specialty:  General Surgery   Contact information:   [address] [place] [address] [phone]       Follow up with Kelly Lesches, MD. (in1-2weeks, call for appt upon discharge)    Specialty:  Cardiology   Contact information:   [address] [place] [address] [phone]        The results of significant diagnostics from this hospitalization (including imaging, microbiology, ancillary and laboratory) are listed below for reference.    Significant Diagnostic Studies: Dg Chest Port 1 View  10/14/2013   CLINICAL DATA:  78 year old female status post laparotomy, bowel surgery. Initial encounter.  EXAM: PORTABLE CHEST - 1 VIEW  COMPARISON:  10/12/2013 and earlier.  FINDINGS: Portable AP semi upright view at 0633 hrs. Extubated. Stable lung volumes. Stable enteric tube. Stable right IJ central line. Stable left chest cardiac pacemaker. Stable cardiac size and mediastinal contours. Continued small bilateral pleural effusions and dense retrocardiac opacity. Pulmonary vascular congestion has increased, no overt edema.  IMPRESSION: 1. Extubated.  Otherwise,  stable lines and tubes.  2. Increased pulmonary vascular congestion without overt edema. 3. Stable bilateral pleural effusions and lower lobe collapse or consolidation.   Electronically Signed   By: Lars Pinks M.D.   On: 10/14/2013 07:52   Dg Chest Port 1 View  10/12/2013   CLINICAL DATA:  Status post hip arthroplasty  EXAM: PORTABLE CHEST - 1 VIEW  COMPARISON:  Portable chest x-ray of October 11, 2013  FINDINGS: The lungs are slightly better inflated today. The pulmonary interstitium has improved. There remain small bilateral pleural effusions and there is left lower lobe atelectasis. The cardiac silhouette is top-normal in size. The pulmonary vascularity is normal.  The endotracheal tube tip lies 5.2 cm above the crotch of the carina. The esophagogastric tube tip and proximal port lie below the GE junction. The right internal jugular venous catheter tip lies in the proximal SVC. The permanent pacemaker is in appropriate position.  IMPRESSION: There has been interval improvement in the appearance of the lungs consistent with resolving interstitial edema. There remains left lower lobe atelectasis and small bilateral pleural effusions. The support tubes and lines are in appropriate position.   Electronically Signed   By: David  Martinique   On: 10/12/2013 08:04   Dg Chest Port 1 View  10/11/2013   CLINICAL DATA:  Intubation.  EXAM: PORTABLE CHEST - 1 VIEW  COMPARISON:  10/10/2013.  FINDINGS: Endotracheal tube, NG tube, right IJ line in stable position. Cardiac pacer with lead tips in the right atrium and right ventricle. Heart size is stable. Bibasilar atelectasis and/or infiltrates with small bilateral pleural effusions noted. No pneumothorax. No acute osseous abnormality.  IMPRESSION: 1. Line and tube position stable. 2. Persistent bibasilar atelectasis and/or infiltrates and bilateral small pleural effusions.   Electronically Signed   By: Marcello Moores  Register   On: 10/11/2013 07:42   Dg Chest Port 1 View  10/10/2013   CLINICAL DATA:  TLC  placement  EXAM: PORTABLE CHEST - 1 VIEW  COMPARISON:  Earlier in the day at 44 AM.  FINDINGS: 10:04 a.m. Endotracheal tube 5.2 cm above carina. Pacer with leads at right atrium and right ventricle. No lead discontinuity. Nasogastric tube terminates at the body of the stomach. Right internal jugular line is unchanged with tip at low SVC. No new catheter identified.  Normal heart size. Mild right hemidiaphragm elevation. Probable small layering bilateral pleural effusions. No pneumothorax. Low lung volumes with patchy bibasilar airspace disease. Minimally increased.  IMPRESSION: Similar appearance of support apparatus, without new catheter identified.  Small bilateral pleural effusions with slightly increased Airspace disease, likely atelectasis.   Electronically Signed   By: Abigail Miyamoto M.D.   On: 10/10/2013 10:13   Dg Chest Portable 1 View  10/10/2013   CLINICAL DATA:  Patient on ventilator status post abdominal surgery  EXAM: PORTABLE CHEST - 1 VIEW  COMPARISON:  10/09/2013  FINDINGS: Endotracheal tube in unchanged position. NG tube again crosses the gastroesophageal junction and 2 lead pacer noted. Right internal jugular central line stable.  Elevated right diaphragm with blunting of right costophrenic angle stable. This suggests right lower lobe consolidation and possibly a small effusion. On the left, there is mild hazy lower lobe opacity. This is minimally worse when compared to the prior study.  IMPRESSION: Minimally worse bilateral lower lobe opacities suggesting bibasilar consolidation possibly atelectasis. Tiny pleural effusions not excluded.   Electronically Signed   By: Skipper Cliche M.D.   On: 10/10/2013 08:01   Dg Chest Port 1  View  10/09/2013   CLINICAL DATA:  Endotracheal tube and central line placement.  EXAM: PORTABLE CHEST - 1 VIEW  COMPARISON:  Chest radiograph performed 09/27/2013  FINDINGS: The patient's endotracheal tube is seen ending 4-5 cm above the carina. A right IJ line is  noted ending about the mid SVC. An enteric tube tip extends below the diaphragm.  Lungs expansion is mildly decreased. Mild bibasilar airspace opacities likely reflect atelectasis. No pleural effusion or pneumothorax is seen  The cardiomediastinal silhouette is borderline normal in size. A pacemaker is noted overlying the left chest wall, with leads ending overlying the right atrium and right ventricle. No acute osseous abnormalities are seen.  IMPRESSION: 1. Endotracheal tube seen ending 4-5 cm above the carina. 2. Right IJ line noted ending about the mid SVC. 3. Mildly hypoexpanded lungs; mild bibasilar airspace opacities likely reflect atelectasis.   Electronically Signed   By: Garald Balding M.D.   On: 10/09/2013 23:43   Dg Abd Portable 1v  10/13/2013   CLINICAL DATA:  Abdominal discomfort.  EXAM: PORTABLE ABDOMEN - 1 VIEW  COMPARISON:  CT 10/07/2013.  FINDINGS: NG tube noted projected over stomach. The stomach is nondistended. Ostomy site left abdomen now noted. Persistent distention of the left, transverse, and right colon noted. The distention has diminished from prior exam. Stool is noted in colon. No free air. Aortoiliac atherosclerotic vascular disease.Bilateral hip replacements degenerative changes scoliosis lumbar spine.  IMPRESSION: 1. Left abdomen ostomy site now noted. 2. Previously identified colonic distention remains but had subsided partially. 3. NG tube is noted projected over stomach.  No gastric distention.   Electronically Signed   By: Marcello Moores  Register   On: 10/13/2013 16:55    Microbiology: Recent Results (from the past 240 hour(s))  SURGICAL PCR SCREEN     Status: None   Collection Time    10/09/13  8:50 PM      Result Value Ref Range Status   MRSA, PCR NEGATIVE  NEGATIVE Final   Staphylococcus aureus NEGATIVE  NEGATIVE Final   Comment:            The Xpert SA Assay (FDA     approved for NASAL specimens     in patients over 63 years of age),     is one component of     a  comprehensive surveillance     program.  Test performance has     been validated by [employer] for patients greater     than or equal to 59 year old.     It is not intended     to diagnose infection nor to     guide or monitor treatment.     Labs: Basic Metabolic Panel:  Recent Labs Lab 10/13/13 0400 10/14/13 0400 10/15/13 0244 10/16/13 0344 10/17/13 0644 10/18/13 0344  NA 132* 131* 133* 135* 133* 135*  K 3.0* 3.3* 4.0 3.7 3.4* 3.6*  CL 84* 85* 87* 88* 90* 93*  CO2 36* 36* 35* 37* 35* 32  GLUCOSE 88 85 79 117* 100* 111*  BUN 10 13 13 11 8 7   CREATININE 0.54 0.53 0.50 0.43* 0.47* 0.48*  CALCIUM 7.8* 8.2* 8.1* 8.6 8.5 8.5  MG 1.9 1.8 1.8  --   --   --   PHOS 3.3 2.8 2.2*  --   --   --    Liver Function Tests:  Recent Labs Lab 10/16/13 0344 10/17/13 0644  AST 14 16  ALT  11 11  ALKPHOS 80 79  BILITOT 0.6 0.6  PROT 5.4* 5.2*  ALBUMIN 2.1* 2.1*   No results found for this basename: LIPASE, AMYLASE,  in the last 168 hours No results found for this basename: AMMONIA,  in the last 168 hours CBC:  Recent Labs Lab 10/13/13 0400 10/14/13 0400 10/15/13 0244 10/16/13 0344 10/17/13 0644  WBC 11.6* 9.5 9.1 9.1 6.3  HGB 10.0* 9.7* 9.5* 9.6* 10.1*  HCT 32.5* 31.6* 31.7* 32.2* 33.2*  MCV 84.2 86.6 86.6 87.7 88.8  PLT 262 252 248 271 283   Cardiac Enzymes: No results found for this basename: CKTOTAL, CKMB, CKMBINDEX, TROPONINI,  in the last 168 hours BNP: BNP (last 3 results) No results found for this basename: PROBNP,  in the last 8760 hours CBG:  Recent Labs Lab 10/15/13 1131 10/15/13 1608 10/15/13 2006 10/15/13 2357 10/16/13 0349  GLUCAP 177* 153* 214* 160* 134*       SignedDebbe Odea, MD [facility] 10/19/2013, 1:34 PM

## 2013-10-19 NOTE — Consult Note (Signed)
WOC ostomy follow up Stoma type/location: LLQ, end colostomy Stomal assessment/size: 1 3/4" round, budded, pink and moist. Minimal stomal sloughing at the perimeter of the stoma Peristomal assessment: intact, a little reddened today Treatment options for stomal/peristomal skin: none needed Output brown, pasty Ostomy pouching: 2pc. 2 3/4" used today Education provided: met with patient and her daughter [name] today.  Pt will dc to SNF however wanted to demonstrate and have daughter who will be primary CG at home participate in ostomy care. Demonstrated emptying of the pouch, measurement of stoma, cutting of new wafer.  Daughter assisted with placement of wafer on pts abdomen and attached new pouch to the wafer. Educational materials left with daughter and [employer] catalog marked with items needed. Provided my contact information to the patient. Enrolled patient in [facility] Discharge program: Yes  WOC will continue to follow along with you Para March RN,CWOCN [phone]

## 2013-10-24 ENCOUNTER — Telehealth (INDEPENDENT_AMBULATORY_CARE_PROVIDER_SITE_OTHER): Payer: Self-pay

## 2013-10-24 ENCOUNTER — Other Ambulatory Visit (INDEPENDENT_AMBULATORY_CARE_PROVIDER_SITE_OTHER): Payer: Self-pay

## 2013-10-24 MED ORDER — AMOXICILLIN-POT CLAVULANATE 875-125 MG PO TABS: 1.0000 | ORAL_TABLET | Freq: Two times a day (BID) | ORAL | Status: AC

## 2013-10-24 NOTE — Telephone Encounter (Signed)
They can culture wound and see if infected. Can start some augmentin bid x 1 week. If culture negative stop abx

## 2013-10-24 NOTE — Telephone Encounter (Signed)
Spoke with nurse. Informed her of Dr Dois Davenport recommendations. Verbal order given for Augmentin 875mg  BIX X 7 days. Informed nurse that pt can d/c abx if culture is negative. Nurse verbalized understanding.

## 2013-10-24 NOTE — Telephone Encounter (Signed)
Pt s/p exp lap on 10/09/13 by Dr Redmond Pulling. Pt is currently at the [facility] in [place]. Amy, RN was changing her dressing today and noticed that she had a bright green discharge. Nurse denies any redness, swelling, odors or tenderness.  Pt also denies any fevers or chills at this time. They are currently doing dressing changes BID. Nurse states that the draining has not gotten any worse since the last dressing change. Nurse wanted to know if they needed to send off for a culture or to start abx.  Informed Nurse that I would send Dr Redmond Pulling a message for further recommendations and we would contact her as soon as we received a response. She verbalized understanding.

## 2013-10-28 ENCOUNTER — Ambulatory Visit (INDEPENDENT_AMBULATORY_CARE_PROVIDER_SITE_OTHER): Payer: Medicare Other | Admitting: General Surgery

## 2013-10-28 DIAGNOSIS — Z09 Encounter for follow-up examination after completed treatment for conditions other than malignant neoplasm: Secondary | ICD-10-CM

## 2013-10-28 NOTE — Progress Notes (Signed)
Subjective:     Patient ID: [name], female   DOB: 01-Dec-1935, 78 y.o.   MRN: [id number]  HPI This is a patient of Dr. Greer Pickerel. She is status post colectomy and colostomy. She is very weak and having a difficult time recovering.   Review of Systems Low-grade fevers. Bleeding from her wound. Poor appetite.    Objective:   Physical Exam Her midline wound is open and draining a small amount of purulent fluid. It easily bleeds because the patient is on Coumadin.  Her stoma is pink and viable and draining normal looking stool.    Assessment:     Slow recovery from colectomy and colostomy. Open wound which is healing and granulating with a small amount of purulent drainage. The patient is afebrile.     Plan:     Continue wet to dry dressings as currently being done with saline soaked 4 x 4 gauze and AVD pads. This should be done twice a day. Return to clinic to see Dr. Greer Pickerel as soon as possible.

## 2013-11-02 ENCOUNTER — Telehealth (INDEPENDENT_AMBULATORY_CARE_PROVIDER_SITE_OTHER): Payer: Self-pay

## 2013-11-02 NOTE — Telephone Encounter (Signed)
This is a patient of Dr. Dois Davenport.  He should be able to answer this question.

## 2013-11-02 NOTE — Telephone Encounter (Signed)
[facility] Amy RN states patients abdomen wound has opened and has a tunneling mid center of abdomen . She is asking if the opened area should be packed with wet to dry also . I advised her to continue to pack wet to dry bid as indicated in last  Ov note  and we will call with further orders of DR. Wyatts. # [phone] FGB

## 2013-11-03 ENCOUNTER — Encounter: Payer: Medicare Other | Admitting: Adult Health

## 2013-11-03 NOTE — Telephone Encounter (Signed)
Tried calling [name] back at below number but it said number was discontinued. Per Dr Hulen Skains continue curen wet to dry dressing change. Try and get dressing down as far to bottom as they can. Dr Redmond Pulling can assess wound nxt week and change accordingly.

## 2013-11-09 ENCOUNTER — Ambulatory Visit (INDEPENDENT_AMBULATORY_CARE_PROVIDER_SITE_OTHER): Payer: Medicare Other | Admitting: General Surgery

## 2013-11-09 ENCOUNTER — Encounter (INDEPENDENT_AMBULATORY_CARE_PROVIDER_SITE_OTHER): Payer: Self-pay | Admitting: General Surgery

## 2013-11-09 VITALS — BP 116/70 | HR 110 | Temp 99.0°F | Ht 65.0 in | Wt 112.0 lb

## 2013-11-09 DIAGNOSIS — Z09 Encounter for follow-up examination after completed treatment for conditions other than malignant neoplasm: Secondary | ICD-10-CM

## 2013-11-10 NOTE — Progress Notes (Signed)
Subjective:     Patient ID: [name], female   DOB: 07-12-1935, 78 y.o.   MRN: [id number]  HPI 78 year old female comes in for followup after being in the hospital from July 19 to the 29th as an emergency surgery patient. She had diverticulitis with perforation and underwent exploratory laparotomy and Hartman's procedure on July 19. She was discharged to a nursing facility. Her midline incision was left open. They have been doing wet-to-dry packings. Her main complaint today is left groin pain. She states that it started acutely about one week ago. It does not bother her when she is sitting down or laying down. It is only present when she is putting weight on it. She describes it as a very burning painful sensation and is unable to put weight on her foot. Therefore her therapies have been essentially limited to her wheelchair. She is accompanied by her daughter. She has been restarted on Coumadin. She reports that her appetite is slowly getting better. Her ostomy is functioning very well.  Review of Systems     Objective:   Physical Exam  Vitals reviewed. Constitutional: She is oriented to person, place, and time.  Frail thin WF on O2 sitting in wheelchair in NAD  Pulmonary/Chest: Effort normal. No respiratory distress. She has no wheezes.  Abdominal: Soft. She exhibits no distension. There is no tenderness.  Open lower midline incision with granulation tissue. Not terribly deep. In mid portion of incision there is an area that tunnel with some fibrinous exudate. No cellulitis. Ostomy L abd - no hernia, stool in bag  Musculoskeletal: She exhibits no edema.  Neurological: She is alert and oriented to person, place, and time.  Skin: Skin is warm and dry. She is not diaphoretic.   BP 116/70  Pulse 110  Temp(Src) 99 F (37.2 C)  Ht 5\' 5"  (1.651 m)  Wt 112 lb (50.803 kg)  BMI 18.64 kg/m2     Assessment:     Status post exploratory laparotomy, Hartmann's for procedure for  perforated sigmoid diverticulitis     Plan:     Overall she appears to be doing okay. We discussed the importance of eating well in order to promote healing. I advised her daughter that if she didn't feel like eating solid foods than she should drink a meal replacement shake such as boost or Ensure. I would encourage ongoing physical therapy and a facial therapy. It is unclear as to the exact etiology of the left inguinal pain. I do not believe it is consistent with an infection or an intra-abdominal problem. The fact that it is only present with standing suggests a musculoskeletal etiology. It also sounds neuropathic. My suspicion is that she may have an inguinal strain. We discussed performing a CT scan of the abdomen and pelvis without fever, nausea, vomiting, or signs of wound infection and do believe it would be low yield. Followup 6 weeks. Advised her to try ice pack to left groin and low dose NSAIDs  Leighton Ruff. Redmond Pulling, MD, FACS General, Bariatric, & Minimally Invasive Surgery [facility], [place]

## 2013-11-21 ENCOUNTER — Encounter: Payer: Self-pay | Admitting: *Deleted

## 2013-12-30 ENCOUNTER — Encounter (INDEPENDENT_AMBULATORY_CARE_PROVIDER_SITE_OTHER): Payer: Medicare Other | Admitting: General Surgery

## 2013-12-30 ENCOUNTER — Encounter: Payer: Self-pay | Admitting: Cardiology

## 2014-01-18 ENCOUNTER — Telehealth: Payer: Self-pay | Admitting: Internal Medicine

## 2014-01-18 ENCOUNTER — Other Ambulatory Visit: Payer: Self-pay

## 2014-01-18 MED ORDER — VERAPAMIL HCL ER 120 MG PO TBCR: 120.0000 mg | EXTENDED_RELEASE_TABLET | Freq: Two times a day (BID) | ORAL | Status: DC

## 2014-01-18 NOTE — Telephone Encounter (Signed)
Please see refill bin / tgs  °

## 2014-01-19 ENCOUNTER — Telehealth: Payer: Self-pay | Admitting: Internal Medicine

## 2014-01-19 NOTE — Telephone Encounter (Signed)
Refill sent on 10/28

## 2014-01-19 NOTE — Telephone Encounter (Signed)
Please see refill bin / tgs  °

## 2014-03-15 ENCOUNTER — Ambulatory Visit (HOSPITAL_COMMUNITY): Payer: Medicare Other | Attending: General Surgery | Admitting: Physical Therapy

## 2014-03-21 ENCOUNTER — Encounter: Payer: Self-pay | Admitting: *Deleted

## 2014-03-29 ENCOUNTER — Encounter: Payer: Self-pay | Admitting: *Deleted

## 2014-04-21 ENCOUNTER — Encounter: Payer: Self-pay | Admitting: *Deleted

## 2014-04-27 ENCOUNTER — Other Ambulatory Visit: Payer: Self-pay | Admitting: Internal Medicine

## 2014-05-18 ENCOUNTER — Encounter: Payer: Self-pay | Admitting: *Deleted

## 2014-06-13 IMAGING — US US ABDOMEN LIMITED
1 series · 14 of 25 positions shown · non-contrast
Comparison: None.

CLINICAL DATA: Nausea and vomiting.

LIMITED ABDOMINAL ULTRASOUND - RIGHT UPPER QUADRANT

[Series 1: us abdomen limited · 0.21mm/px · 14 of 61 slices shown]
[im 1/61]
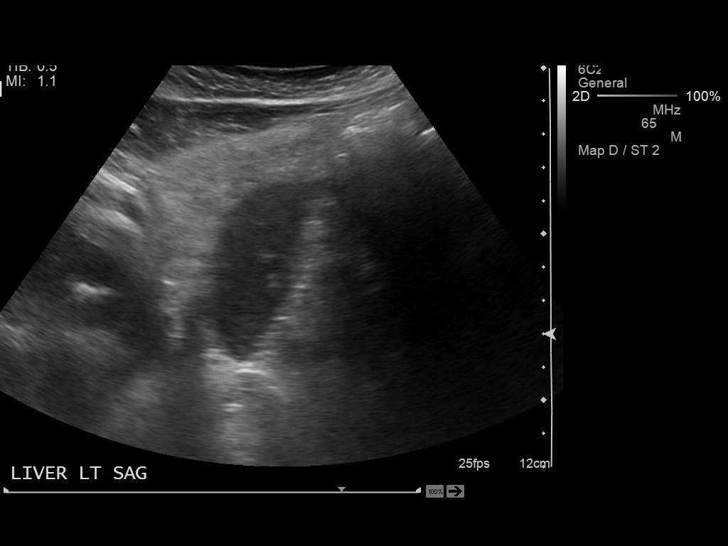
[im 6/61]
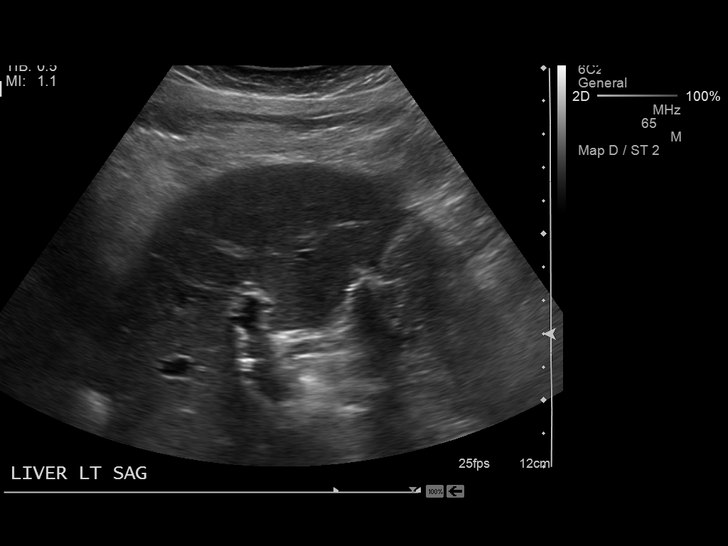
[im 11/61]
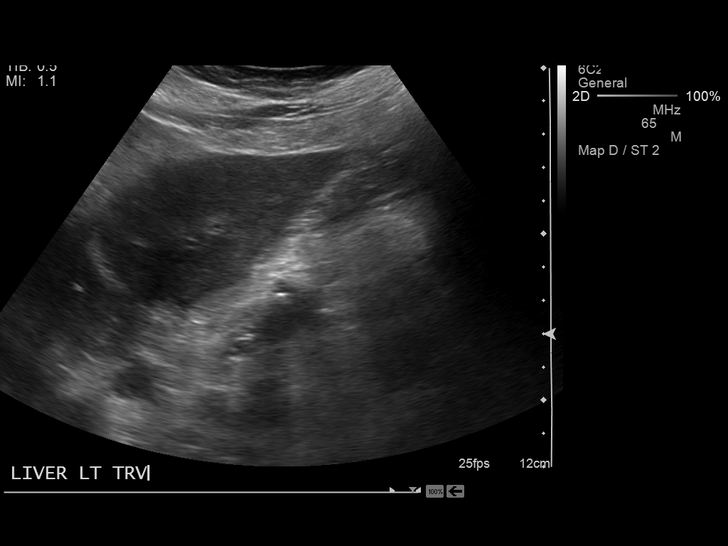
[im 16/61]
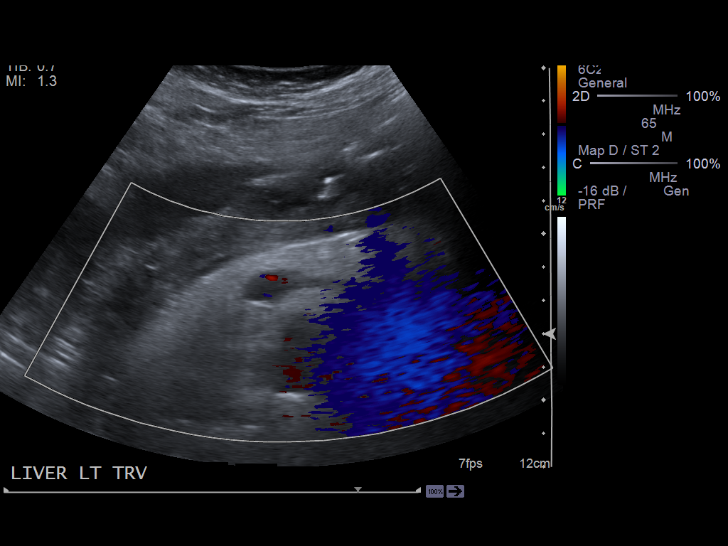
[im 21/61]
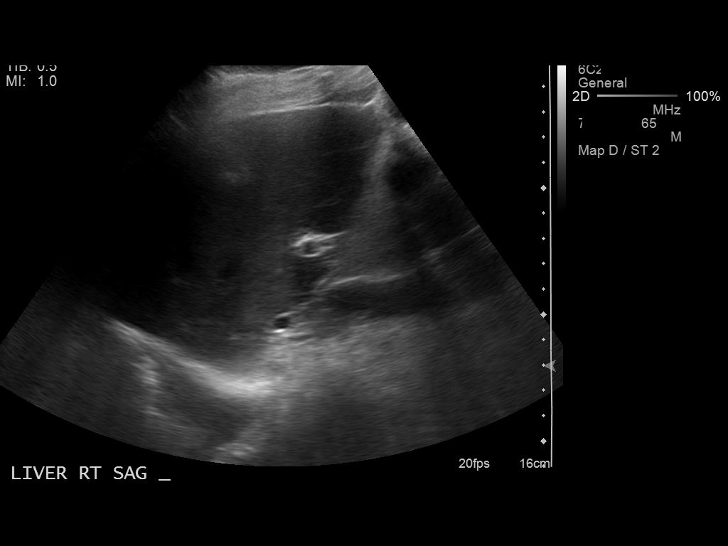
[im 23/61]
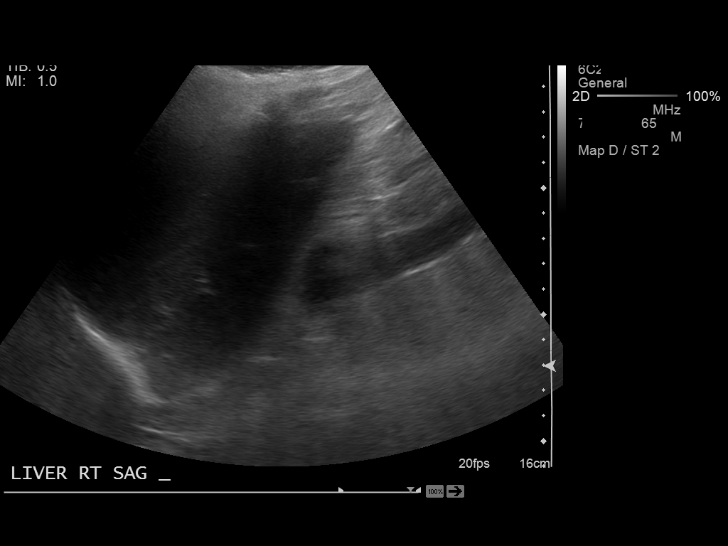
[im 28/61]
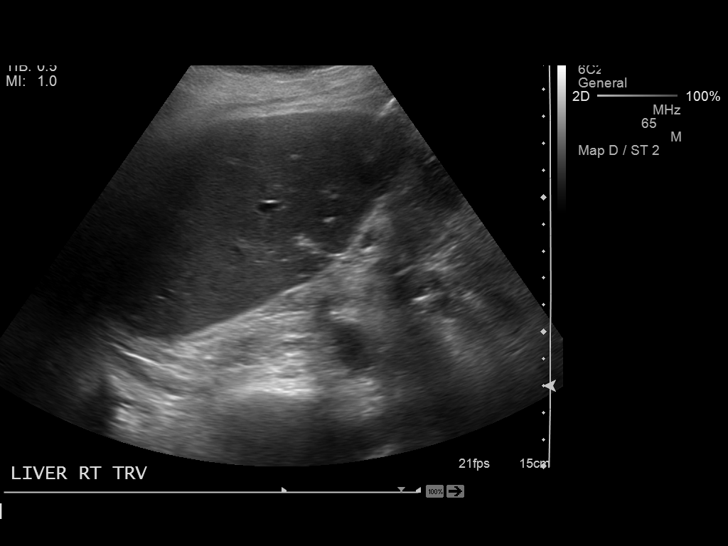
[im 33/61]
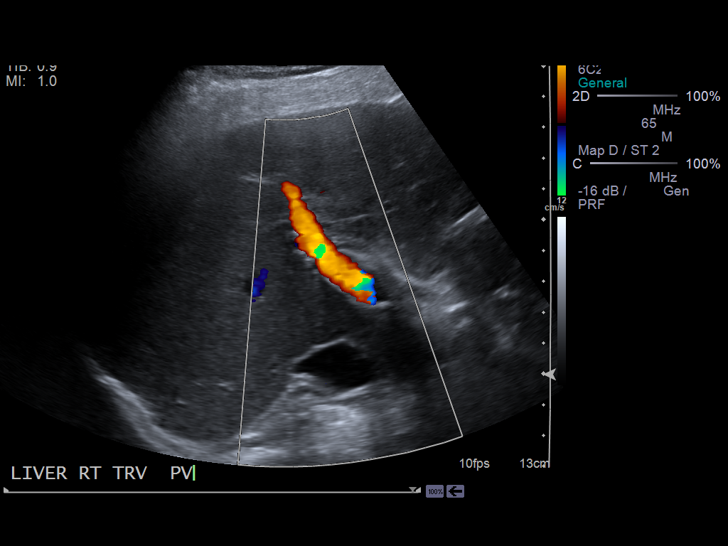
[im 38/61]
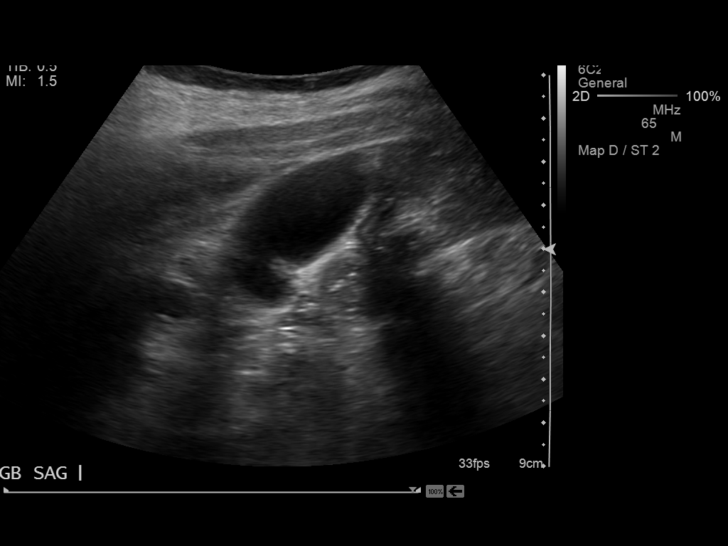
[im 41/61]
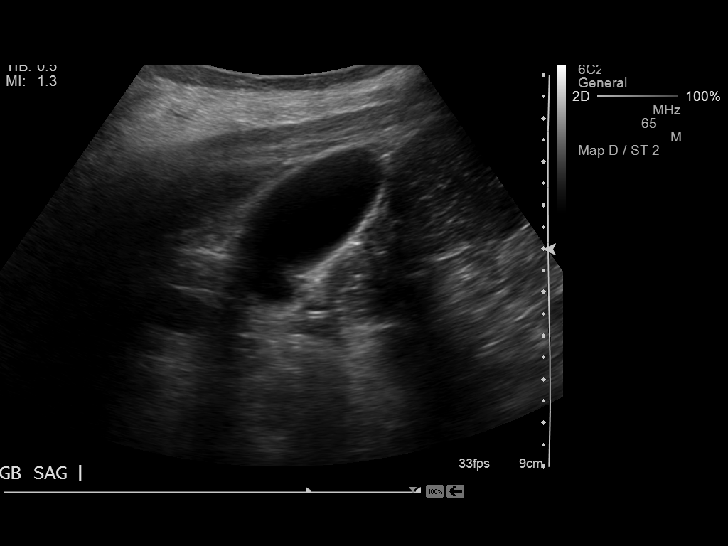
[im 46/61]
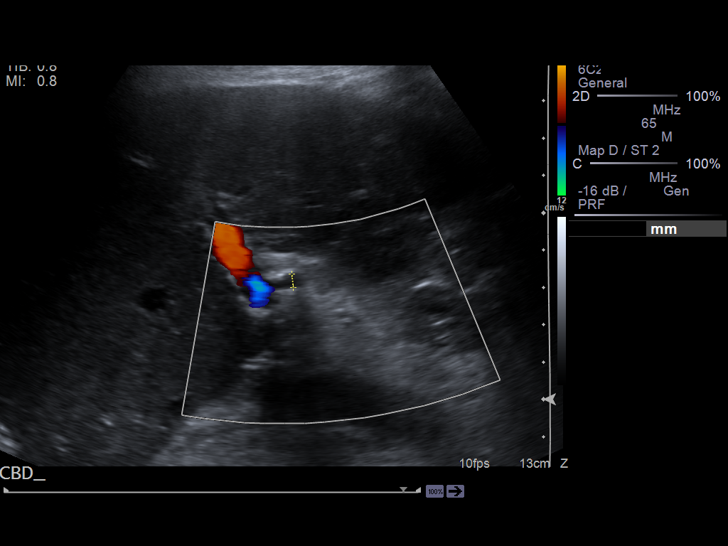
[im 51/61]
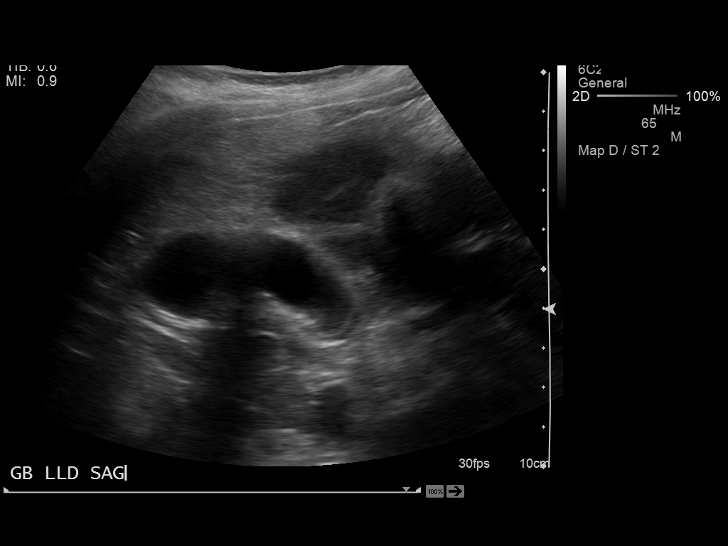
[im 56/61]
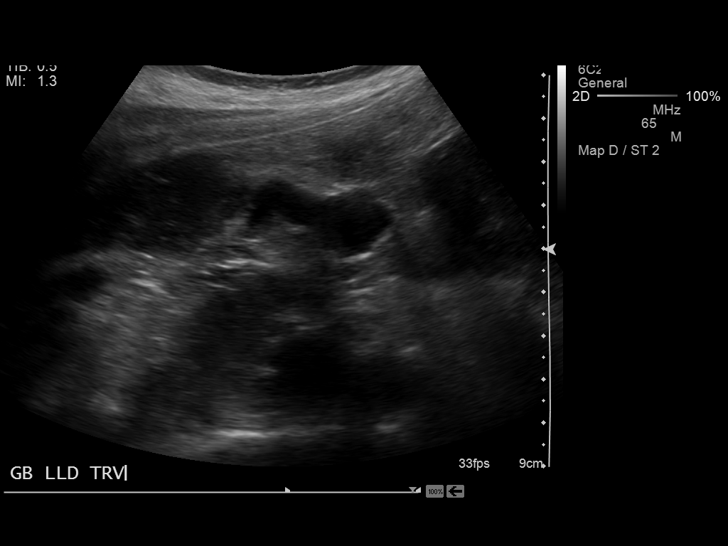
[im 61/61]
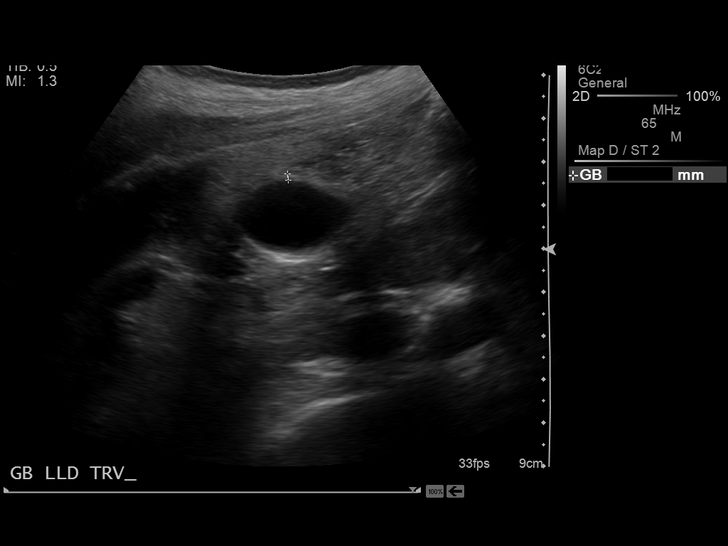

[14 of 25 positions shown; findings below may reference images not displayed]

FINDINGS: Gallbladder:  No shadowing gallstones or echogenic sludge.  No
gallbladder wall thickening or pericholecystic fluid.  Negative
sonographic Murphy's sign according to the ultrasound technologist.

Common bile duct:  Normal caliber of 5 mm.

Liver:  No focal mass lesion seen.  Within normal limits in
parenchymal echogenicity.
IMPRESSION: Normal right upper quadrant ultrasound.

## 2014-06-22 ENCOUNTER — Encounter: Payer: Self-pay | Admitting: *Deleted

## 2014-07-13 ENCOUNTER — Other Ambulatory Visit: Payer: Self-pay | Admitting: Cardiology

## 2014-07-19 ENCOUNTER — Encounter: Payer: Self-pay | Admitting: *Deleted

## 2014-07-21 ENCOUNTER — Other Ambulatory Visit: Payer: Self-pay | Admitting: Cardiology

## 2014-07-21 MED ORDER — DIGOXIN 125 MCG PO TABS: 125.0000 ug | ORAL_TABLET | Freq: Every day | ORAL | Status: DC

## 2014-07-21 MED ORDER — VERAPAMIL HCL ER 120 MG PO TBCR: 120.0000 mg | EXTENDED_RELEASE_TABLET | Freq: Two times a day (BID) | ORAL | Status: AC

## 2014-07-21 NOTE — Telephone Encounter (Signed)
Patient needs refill on Digoxin and Verapamil for 90 day supplies sent to [facility] / tg

## 2014-07-21 NOTE — Telephone Encounter (Signed)
Refills complete 

## 2014-08-09 ENCOUNTER — Encounter: Payer: Medicare Other | Admitting: Internal Medicine

## 2014-09-18 ENCOUNTER — Encounter: Payer: Self-pay | Admitting: Internal Medicine

## 2014-09-18 ENCOUNTER — Ambulatory Visit (INDEPENDENT_AMBULATORY_CARE_PROVIDER_SITE_OTHER): Payer: Medicare Other | Admitting: Internal Medicine

## 2014-09-18 VITALS — BP 128/60 | HR 93

## 2014-09-18 DIAGNOSIS — I482 Chronic atrial fibrillation: Secondary | ICD-10-CM | POA: Diagnosis not present

## 2014-09-18 DIAGNOSIS — I10 Essential (primary) hypertension: Secondary | ICD-10-CM | POA: Diagnosis not present

## 2014-09-18 DIAGNOSIS — Z950 Presence of cardiac pacemaker: Secondary | ICD-10-CM

## 2014-09-18 DIAGNOSIS — I495 Sick sinus syndrome: Secondary | ICD-10-CM

## 2014-09-18 DIAGNOSIS — Z136 Encounter for screening for cardiovascular disorders: Secondary | ICD-10-CM | POA: Diagnosis not present

## 2014-09-18 DIAGNOSIS — I4821 Permanent atrial fibrillation: Secondary | ICD-10-CM

## 2014-09-18 LAB — CUP PACEART INCLINIC DEVICE CHECK
Battery Remaining Longevity: 136 mo
Battery Voltage: 2.79 V
Date Time Interrogation Session: 20160627102925
Lead Channel Pacing Threshold Amplitude: 0.5 V
Lead Channel Pacing Threshold Pulse Width: 0.4 ms
Lead Channel Sensing Intrinsic Amplitude: 0.7 mV
Lead Channel Setting Pacing Amplitude: 2.5 V
Lead Channel Setting Pacing Pulse Width: 0.4 ms
MDC IDC MSMT BATTERY IMPEDANCE: 182 Ohm
MDC IDC MSMT LEADCHNL RA IMPEDANCE VALUE: 578 Ohm
MDC IDC MSMT LEADCHNL RV IMPEDANCE VALUE: 453 Ohm
MDC IDC MSMT LEADCHNL RV SENSING INTR AMPL: 8 mV
MDC IDC SET LEADCHNL RV SENSING SENSITIVITY: 2.8 mV
MDC IDC STAT BRADY RV PERCENT PACED: 13 %

## 2014-09-18 NOTE — Assessment & Plan Note (Signed)
Her blood pressure is well controlled. She will continue her current meds. We discussed the importance of a low sodium diet.

## 2014-09-18 NOTE — Assessment & Plan Note (Signed)
Her medtronic DDD pm is working normally. We reprogrammed her to the VVIR mode as she is chronically in atrial fib.

## 2014-09-18 NOTE — Assessment & Plan Note (Signed)
She is oxygen dependent but is not wheezing on exam today. She will continue her bronchodilators.

## 2014-09-18 NOTE — Progress Notes (Signed)
HPI [name] returns today for followup. She is a pleasant 79 yo woman with a h/o COPD, chronic atrial fibrillation, symptomatic bradycardia, s/p PPM insertion. She has class 3 dyspnea and is on home oxygen. She has not had syncope. No anginal symptoms. She has had a seizure a year ago and broke her hip. She developed a bowel obstruction and now has a colostomy.  Allergies  Allergen Reactions  . Tape Other (See Comments)    Paper tape only per family of the patient due to thinning of skin  . Cardizem [Diltiazem Hcl] Itching and Rash  . Sulfonamide Derivatives Rash     Current Outpatient Prescriptions  Medication Sig Dispense Refill  . albuterol (PROAIR HFA) 108 (90 BASE) MCG/ACT inhaler Inhale 2 puffs into the lungs every 6 (six) hours as needed for wheezing or shortness of breath.     . ALPRAZolam (XANAX) 0.25 MG tablet Take 1 tablet (0.25 mg total) by mouth 2 (two) times daily as needed for anxiety. For anxiety 30 tablet 0  . digoxin (LANOXIN) 0.125 MG tablet Take 1 tablet (125 mcg total) by mouth daily. 30 tablet 6  . feeding supplement, ENSURE COMPLETE, (ENSURE COMPLETE) LIQD Take 237 mLs by mouth 3 (three) times daily between meals.    . furosemide (LASIX) 40 MG tablet Take 40 mg by mouth daily as needed. Fluid retention    . HYDROcodone-acetaminophen (NORCO/VICODIN) 5-325 MG per tablet Take 1 tablet by mouth every 4 (four) hours as needed for moderate pain. 30 tablet 0  . ipratropium-albuterol (DUONEB) 0.5-2.5 (3) MG/3ML SOLN Take 3 mLs by nebulization daily as needed (for shortness of breath/wheezing).    . metoprolol tartrate (LOPRESSOR) 25 MG tablet Take 25 mg by mouth 2 (two) times daily.     . ondansetron (ZOFRAN) 4 MG tablet Take 1 tablet (4 mg total) by mouth every 6 (six) hours as needed for nausea. 20 tablet 0  . pantoprazole (PROTONIX) 40 MG tablet Take 40 mg by mouth 2 (two) times daily.     . polyethylene glycol (MIRALAX / GLYCOLAX) packet Take 17 g by mouth  daily. 14 each 0  . raloxifene (EVISTA) 60 MG tablet Take 60 mg by mouth every morning.     . tiotropium (SPIRIVA) 18 MCG inhalation capsule Place 18 mcg into inhaler and inhale at bedtime.     . verapamil (CALAN-SR) 120 MG CR tablet Take 1 tablet (120 mg total) by mouth 2 (two) times daily. 180 tablet 3  . warfarin (COUMADIN) 5 MG tablet Take 1 tablet (5 mg total) by mouth daily. PT/INR in am 7/30 and further dosing per SNF MD     No current facility-administered medications for this visit.     Past Medical History  Diagnosis Date  . Osteoporosis   . Diverticulosis   . Renal cyst   . C. difficile colitis     12/06  . Lumbar and sacral arthritis   . COPD (chronic obstructive pulmonary disease)     PFT 10/05/08 - FEV1 0.89(43%), FVC 1.84(62%), FEV1% 49, TLC 5.64(120%), DLCO 38%  . Hypoxemia     Nocturnal oxygen  . Atrial fibrillation   . Sick sinus syndrome     Medtronic PPM 2004  . Pulmonary nodule   . Pneumonia due to Haemophilus influenzae 03/2011    Positive blood cultures    ROS:   All systems reviewed and negative except as noted in the HPI.   Past Surgical History  Procedure Laterality Date  . Total hip arthroplasty      Right  . Insert / replace / remove pacemaker      Medtronic  . Abdominal hysterectomy    . Bronchoscopy with washings  03/2011    mucus in right lower lobe with obstruction and some bleeding.  chroinc bronchitis, lung collapse,  Brushings showed numerous acute inflammatory cells and degenerated materal .  . Esophagogastroduodenoscopy  05/23/2011    FTD:DUKG distal esophageal erosions consistent with mild  reflux esophagitis/otherwise normal  . Colonoscopy  05/23/2011    URK:YHCWCBJS colorectal polyps-treated/large rectal polyp likely corresponds to the area of abnormal PET activity  . Esophagogastroduodenoscopy  July 2014    Dr. Anthony Sar: retained food in stomach  . Laparotomy N/A 10/09/2013    Procedure: EXPLORATORY LAPAROTOMY, LOCALIZATION OF  SPLENIC FLEXTURE, HARTMANS PROCEDURE;  Surgeon: Gayland Curry, MD;  Location: [facility];  Service: General;  Laterality: N/A;     Family History  Problem Relation Age of Onset  . Lung cancer Mother     ???patient states she had cancer but not sure what type  . Heart disease Brother   . Colon cancer Neg Hx      History   Social History  . Marital Status: Widowed    Spouse Name: N/A  . Number of Children: N/A  . Years of Education: N/A   Occupational History  . Retired     Personal assistant   Social History Main Topics  . Smoking status: Former Smoker -- 1.00 packs/day for 20 years    Types: Cigarettes    Quit date: 03/24/2002  . Smokeless tobacco: Never Used  . Alcohol Use: No  . Drug Use: No  . Sexual Activity: Not Currently   Other Topics Concern  . Not on file   Social History Narrative     BP 128/60 mmHg  Pulse 93  SpO2 90%  Physical Exam:  frail appearing 79 yo woman, NAD HEENT: Unremarkable Neck:  6 cm JVD, no thyromegally Back:  No CVA tenderness Lungs:  Clear except for scattered wheezes. Decreased breath sounds throughout. HEART:  IRegular rate rhythm, no murmurs, no rubs, no clicks Abd:  soft, positive bowel sounds, no organomegally, no rebound, no guarding Ext:  2 plus pulses, no edema, no cyanosis, no clubbing Skin:  No rashes no nodules Neuro:  CN II through XII intact, motor grossly intact  DEVICE  Normal device function.  See PaceArt for details.   Assess/Plan:

## 2014-09-18 NOTE — Patient Instructions (Signed)
Your physician wants you to follow-up in: 12 months with Dr. Lovena Le. You will receive a reminder letter in the mail two months in advance. If you don't receive a letter, please call our office to schedule the follow-up appointment.  Your physician recommends that you schedule a follow-up appointment in: [facility] in 12 months.  Your physician recommends that you continue on your current medications as directed. Please refer to the Current Medication list given to you today.  Thank you for choosing [facility]!

## 2014-09-18 NOTE — Assessment & Plan Note (Signed)
Her ventricular rate is well controlled. I have asked the patient to continue her current meds. If she continues to have seizures, we will need to stop her systemic anti-coagulation.

## 2015-01-31 ENCOUNTER — Ambulatory Visit (INDEPENDENT_AMBULATORY_CARE_PROVIDER_SITE_OTHER): Payer: Medicare Other | Admitting: Pharmacist

## 2015-01-31 DIAGNOSIS — I4821 Permanent atrial fibrillation: Secondary | ICD-10-CM

## 2015-01-31 DIAGNOSIS — I482 Chronic atrial fibrillation: Secondary | ICD-10-CM

## 2015-01-31 LAB — POCT INR: INR: 1.1

## 2015-01-31 NOTE — Patient Instructions (Signed)
Your INR today is 1.1.   Follow Dr. Nelva Bush and your primary care doctor's instructions on when and how to restart your Coumadin.   Recheck your INR with your primary care within 7-10 days.

## 2015-02-06 ENCOUNTER — Other Ambulatory Visit: Payer: Self-pay | Admitting: Cardiology

## 2015-04-06 ENCOUNTER — Ambulatory Visit: Payer: Medicare Other | Admitting: Cardiology

## 2015-05-23 DEATH — deceased

## 2015-07-10 IMAGING — CR DG CHEST 1V PORT
1 series · 1 of 1 positions shown · non-contrast
Comparison: Chest radiograph performed 09/27/2013

CLINICAL DATA: Endotracheal tube and central line placement.

EXAM:
PORTABLE CHEST - 1 VIEW

[AP]
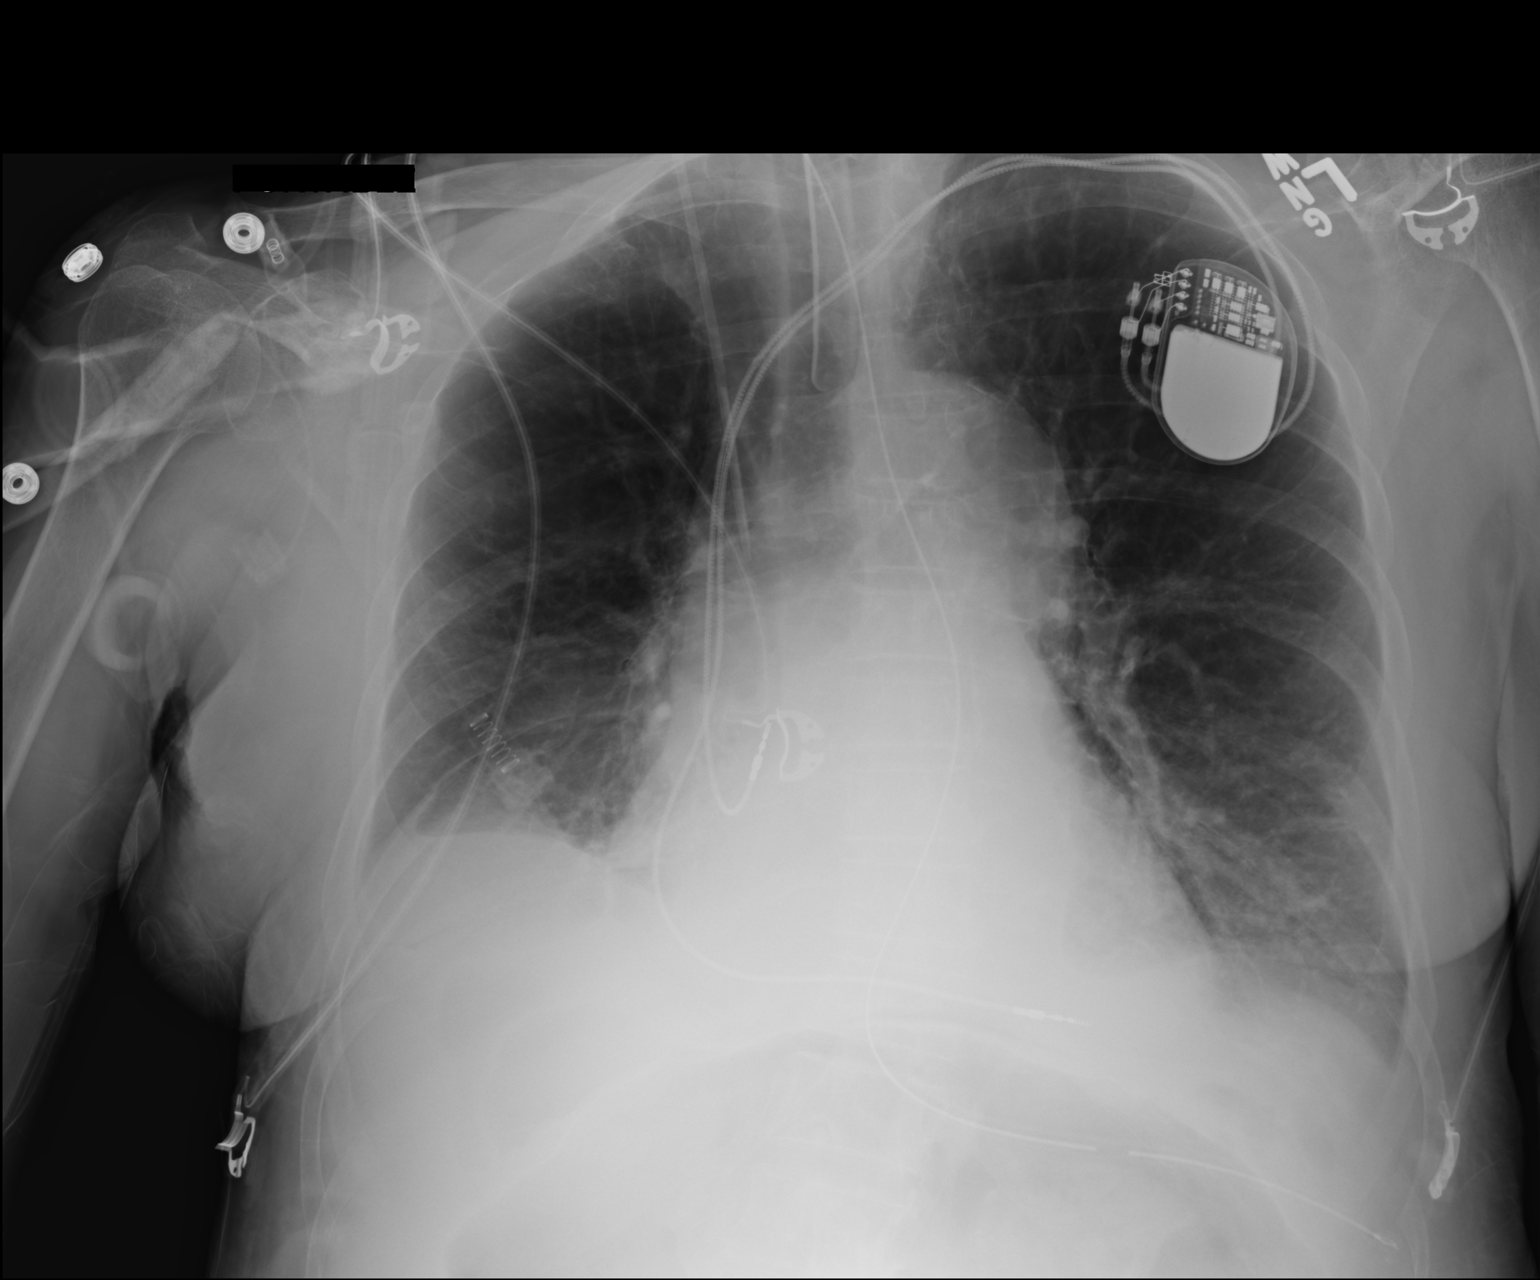

[1 of 1 positions shown; findings below may reference images not displayed]

FINDINGS: The patient's endotracheal tube is seen ending 4-5 cm above the
carina. A right IJ line is noted ending about the mid SVC. An
enteric tube tip extends below the diaphragm.

Lungs expansion is mildly decreased. Mild bibasilar airspace
opacities likely reflect atelectasis. No pleural effusion or
pneumothorax is seen

The cardiomediastinal silhouette is borderline normal in size. A
pacemaker is noted overlying the left chest wall, with leads ending
overlying the right atrium and right ventricle. No acute osseous
abnormalities are seen.
IMPRESSION: 1. Endotracheal tube seen ending 4-5 cm above the carina.
2. Right IJ line noted ending about the mid SVC.
3. Mildly hypoexpanded lungs; mild bibasilar airspace opacities
likely reflect atelectasis.

## 2015-07-11 IMAGING — CR DG CHEST 1V PORT
1 series · 1 of 1 positions shown · non-contrast
Comparison: Earlier in the day at 546 AM.

CLINICAL DATA: [REDACTED] placement

EXAM:
PORTABLE CHEST - 1 VIEW

[AP]
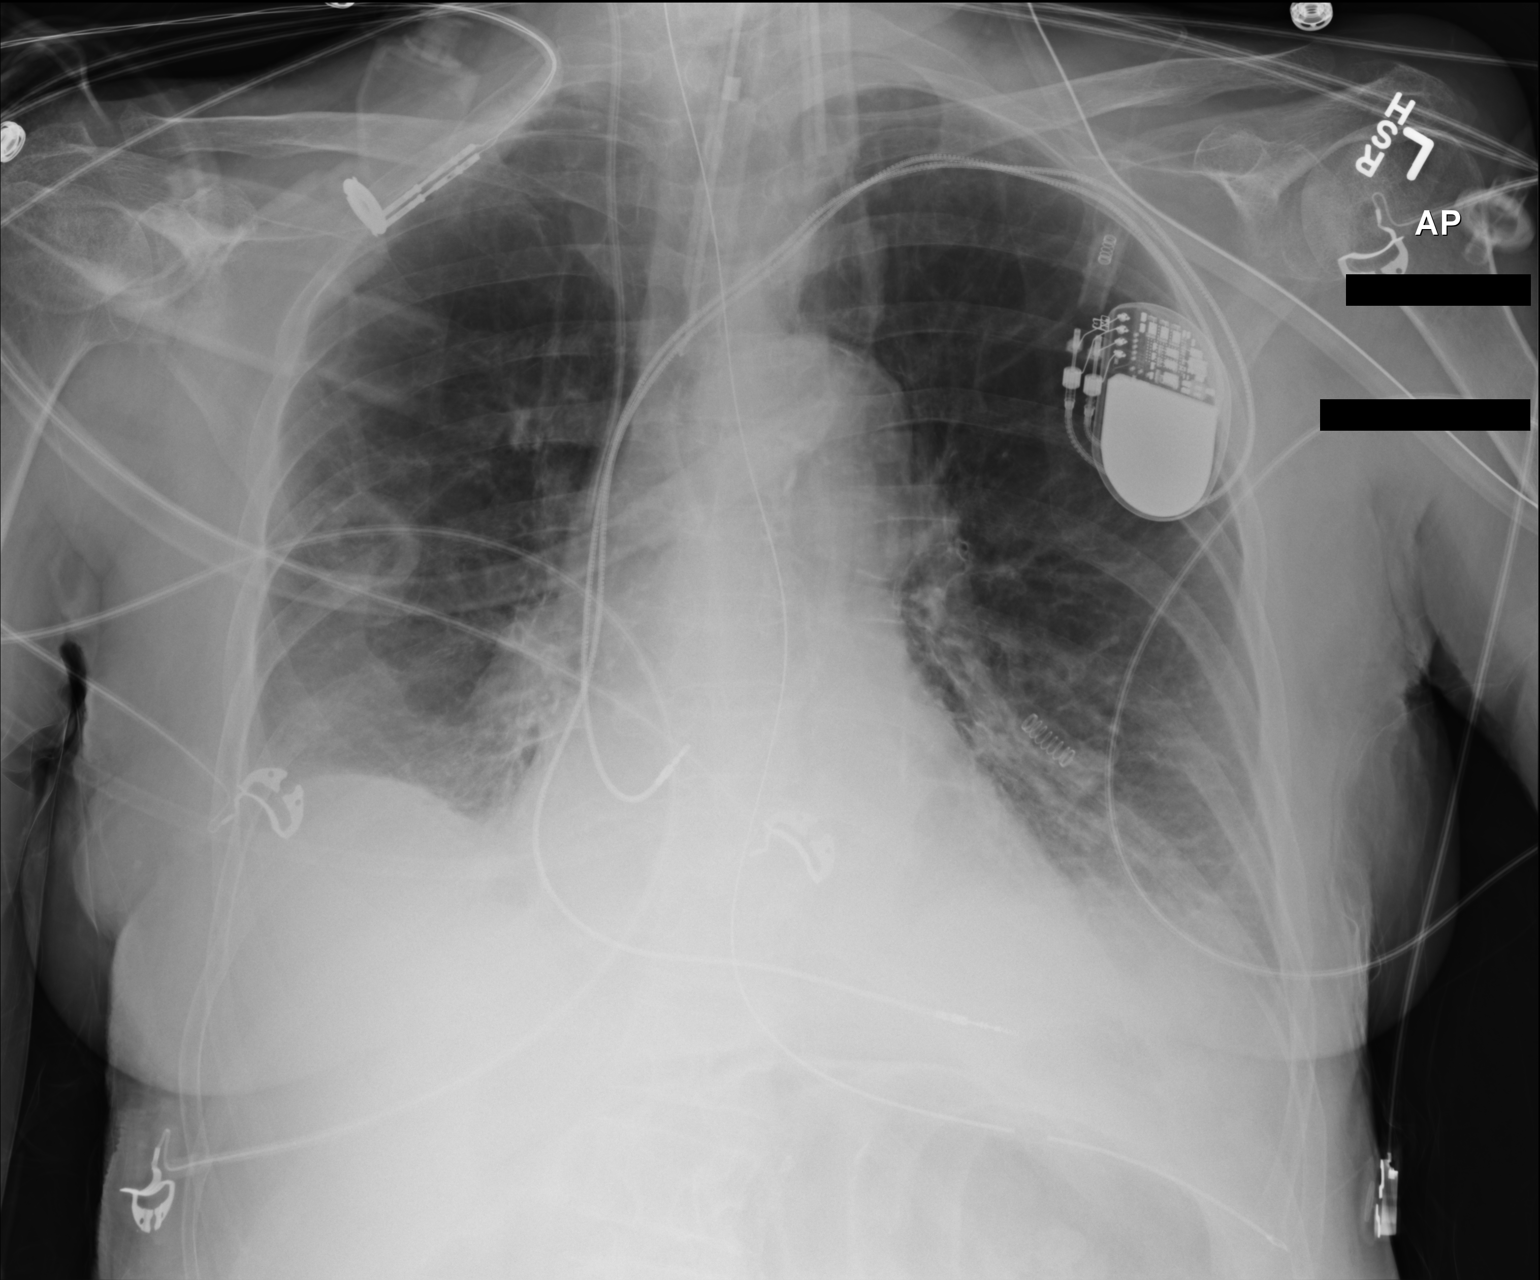

[1 of 1 positions shown; findings below may reference images not displayed]

FINDINGS: [DATE] a.m.. Endotracheal tube 5.2 cm above carina. Pacer with leads
at right atrium and right ventricle. No lead discontinuity.
Nasogastric tube terminates at the body of the stomach. Right
internal jugular line is unchanged with tip at low SVC. No new
catheter identified.

Normal heart size. Mild right hemidiaphragm elevation. Probable
small layering bilateral pleural effusions. No pneumothorax. Low
lung volumes with patchy bibasilar airspace disease. Minimally
increased.
IMPRESSION: Similar appearance of support apparatus, without new catheter
identified.

Small bilateral pleural effusions with slightly increased Airspace
disease, likely atelectasis.

## 2015-07-13 IMAGING — CR DG CHEST 1V PORT
1 series · 1 of 1 positions shown · non-contrast
Comparison: Portable chest x-ray October 11, 2013

CLINICAL DATA: Status post hip arthroplasty

EXAM:
PORTABLE CHEST - 1 VIEW

[AP]
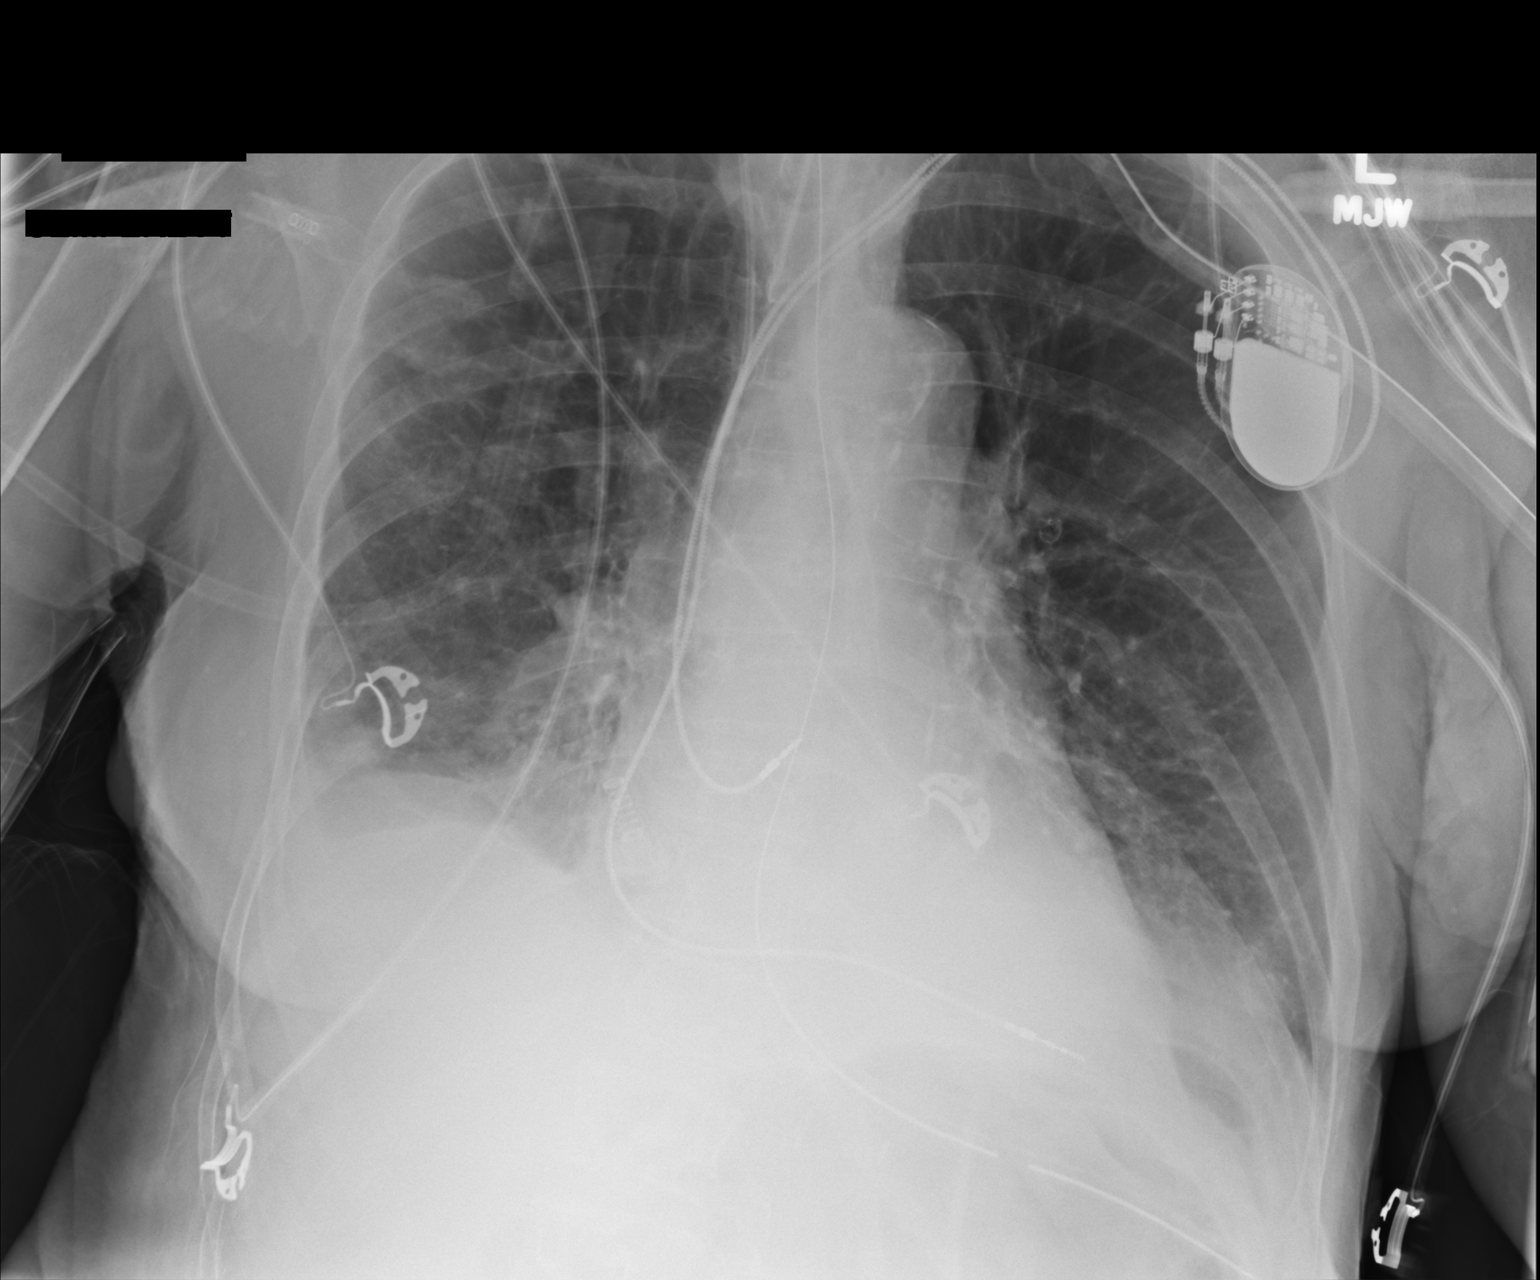

[1 of 1 positions shown; findings below may reference images not displayed]

FINDINGS: The lungs are slightly better inflated today. The pulmonary
interstitium has improved. There remain small bilateral pleural
effusions and there is left lower lobe atelectasis. The cardiac
silhouette is top-normal in size. The pulmonary vascularity is
normal.

The endotracheal tube tip lies 5.2 cm above the crotch of the
carina. The esophagogastric tube tip and proximal port lie below the
GE junction. The right internal jugular venous catheter tip lies in
the proximal SVC. The permanent pacemaker is in appropriate
position.
IMPRESSION: There has been interval improvement in the appearance of the lungs
consistent with resolving interstitial edema. There remains left
lower lobe atelectasis and small bilateral pleural effusions. The
support tubes and lines are in appropriate position.

## 2015-07-14 IMAGING — CR DG ABD PORTABLE 1V
1 series · 1 of 1 positions shown · non-contrast
Comparison: CT 10/07/2013.

CLINICAL DATA: Abdominal discomfort.

EXAM:
PORTABLE ABDOMEN - 1 VIEW

[AP]
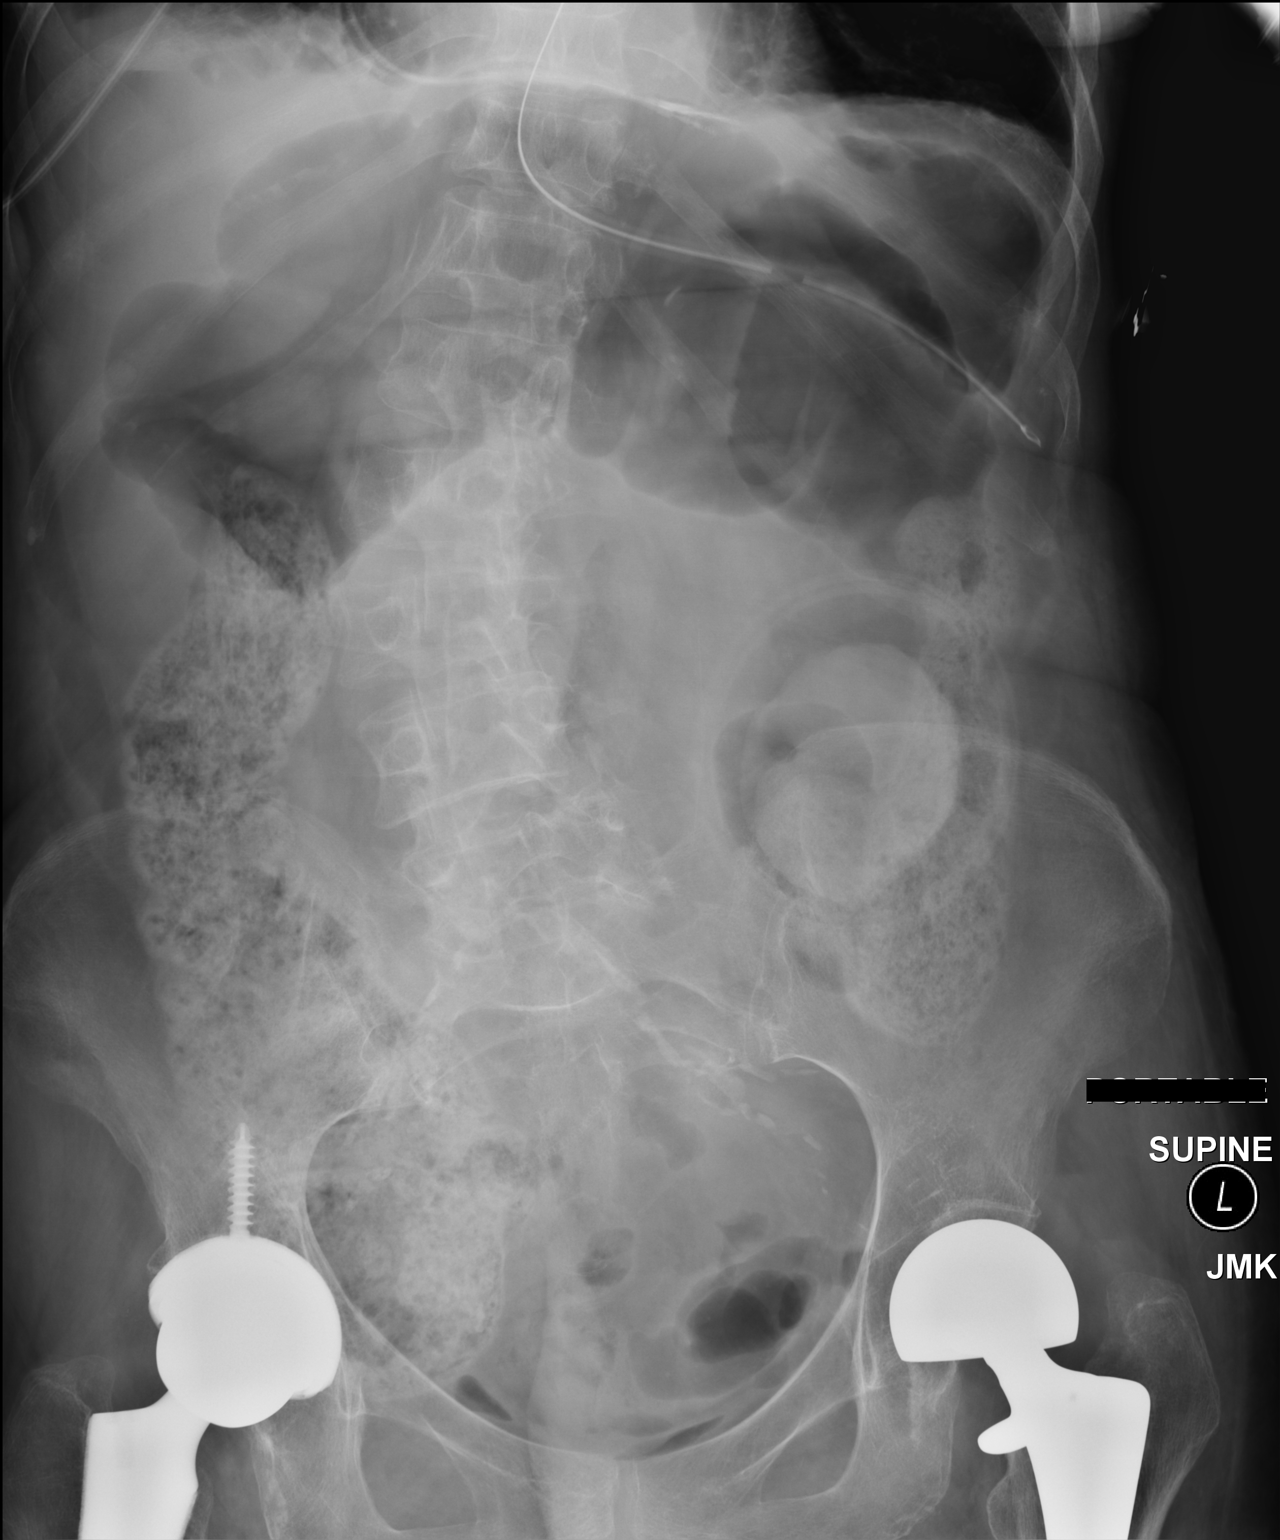

[1 of 1 positions shown; findings below may reference images not displayed]

FINDINGS: NG tube noted projected over stomach. The stomach is nondistended.
Ostomy site left abdomen now noted. Persistent distention of the
left, transverse, and right colon noted. The distention has
diminished from prior exam. Stool is noted in colon. No free air.
Aortoiliac atherosclerotic vascular disease.Bilateral hip
replacements degenerative changes scoliosis lumbar spine.
IMPRESSION: 1. Left abdomen ostomy site now noted.
2. Previously identified colonic distention remains but had subsided
partially.
3. NG tube is noted projected over stomach.  No gastric distention.
